# Patient Record
Sex: Male | Born: 1960
Health system: Southern US, Community
[De-identification: ages and names within clinical notes are randomized; demographics above are authoritative.]

## PROBLEM LIST (undated history)

## (undated) DIAGNOSIS — N281 Cyst of kidney, acquired: Secondary | ICD-10-CM

## (undated) DIAGNOSIS — N189 Chronic kidney disease, unspecified: Secondary | ICD-10-CM

## (undated) DIAGNOSIS — N183 Chronic kidney disease, stage 3 unspecified: Secondary | ICD-10-CM

## (undated) DIAGNOSIS — N201 Calculus of ureter: Secondary | ICD-10-CM

## (undated) DIAGNOSIS — G43009 Migraine without aura, not intractable, without status migrainosus: Secondary | ICD-10-CM

## (undated) DIAGNOSIS — I10 Essential (primary) hypertension: Secondary | ICD-10-CM

## (undated) DIAGNOSIS — F909 Attention-deficit hyperactivity disorder, unspecified type: Secondary | ICD-10-CM

## (undated) DIAGNOSIS — Z87442 Personal history of urinary calculi: Secondary | ICD-10-CM

## (undated) DIAGNOSIS — E039 Hypothyroidism, unspecified: Secondary | ICD-10-CM

## (undated) DIAGNOSIS — Z974 Presence of external hearing-aid: Secondary | ICD-10-CM

## (undated) DIAGNOSIS — Z973 Presence of spectacles and contact lenses: Secondary | ICD-10-CM

## (undated) DIAGNOSIS — M1990 Unspecified osteoarthritis, unspecified site: Secondary | ICD-10-CM

## (undated) DIAGNOSIS — K219 Gastro-esophageal reflux disease without esophagitis: Secondary | ICD-10-CM

## (undated) DIAGNOSIS — R011 Cardiac murmur, unspecified: Secondary | ICD-10-CM

## (undated) DIAGNOSIS — R3915 Urgency of urination: Secondary | ICD-10-CM

## (undated) HISTORY — PX: COLONOSCOPY: SHX174

## (undated) HISTORY — DX: Gastro-esophageal reflux disease without esophagitis: K21.9

## (undated) HISTORY — PX: WISDOM TOOTH EXTRACTION: SHX21

## (undated) HISTORY — PX: INGUINAL HERNIA REPAIR: SUR1180

## (undated) HISTORY — PX: KNEE ARTHROSCOPY: SUR90

## (undated) HISTORY — DX: Migraine without aura, not intractable, without status migrainosus: G43.009

## (undated) HISTORY — DX: Attention-deficit hyperactivity disorder, unspecified type: F90.9

## (undated) HISTORY — DX: Chronic kidney disease, unspecified: N18.9

## (undated) HISTORY — DX: Essential (primary) hypertension: I10

## (undated) HISTORY — PX: TONSILLECTOMY: SUR1361

## (undated) SURGERY — ARTHROSCOPY, SHOULDER
Anesthesia: General | Site: Shoulder | Laterality: Left

---

## 1999-04-24 ENCOUNTER — Ambulatory Visit (HOSPITAL_COMMUNITY): Admission: RE | Admit: 1999-04-24 | Discharge: 1999-04-24 | Payer: Self-pay | Admitting: Orthopedic Surgery

## 1999-04-24 ENCOUNTER — Encounter: Payer: Self-pay | Admitting: Orthopedic Surgery

## 2004-10-01 ENCOUNTER — Ambulatory Visit: Payer: Self-pay | Admitting: Hematology & Oncology

## 2005-04-05 ENCOUNTER — Emergency Department (HOSPITAL_COMMUNITY): Admission: EM | Admit: 2005-04-05 | Discharge: 2005-04-05 | Payer: Self-pay | Admitting: Family Medicine

## 2008-04-06 HISTORY — PX: HERNIA REPAIR: SHX51

## 2008-08-13 ENCOUNTER — Encounter: Admission: RE | Admit: 2008-08-13 | Discharge: 2008-08-13 | Payer: Self-pay | Admitting: Otolaryngology

## 2009-05-13 ENCOUNTER — Emergency Department (HOSPITAL_BASED_OUTPATIENT_CLINIC_OR_DEPARTMENT_OTHER): Admission: EM | Admit: 2009-05-13 | Discharge: 2009-05-13 | Payer: Self-pay | Admitting: Emergency Medicine

## 2009-12-12 ENCOUNTER — Ambulatory Visit: Payer: Self-pay | Admitting: Diagnostic Radiology

## 2009-12-12 ENCOUNTER — Ambulatory Visit (HOSPITAL_BASED_OUTPATIENT_CLINIC_OR_DEPARTMENT_OTHER): Admission: RE | Admit: 2009-12-12 | Discharge: 2009-12-12 | Payer: Self-pay | Admitting: Family Medicine

## 2010-02-04 ENCOUNTER — Ambulatory Visit (HOSPITAL_BASED_OUTPATIENT_CLINIC_OR_DEPARTMENT_OTHER): Admission: RE | Admit: 2010-02-04 | Discharge: 2010-02-04 | Payer: Self-pay | Admitting: Surgery

## 2010-06-17 LAB — POCT HEMOGLOBIN-HEMACUE: Hemoglobin: 14.8 g/dL (ref 13.0–17.0)

## 2015-09-03 ENCOUNTER — Encounter: Payer: Self-pay | Admitting: Family Medicine

## 2015-09-03 ENCOUNTER — Ambulatory Visit (INDEPENDENT_AMBULATORY_CARE_PROVIDER_SITE_OTHER): Payer: BLUE CROSS/BLUE SHIELD | Admitting: Family Medicine

## 2015-09-03 VITALS — BP 124/78 | HR 88 | Resp 12 | Ht 71.0 in | Wt 204.8 lb

## 2015-09-03 DIAGNOSIS — F909 Attention-deficit hyperactivity disorder, unspecified type: Secondary | ICD-10-CM | POA: Diagnosis not present

## 2015-09-03 DIAGNOSIS — I10 Essential (primary) hypertension: Secondary | ICD-10-CM

## 2015-09-03 DIAGNOSIS — G43009 Migraine without aura, not intractable, without status migrainosus: Secondary | ICD-10-CM | POA: Diagnosis not present

## 2015-09-03 DIAGNOSIS — R252 Cramp and spasm: Secondary | ICD-10-CM

## 2015-09-03 DIAGNOSIS — G43909 Migraine, unspecified, not intractable, without status migrainosus: Secondary | ICD-10-CM | POA: Insufficient documentation

## 2015-09-03 LAB — BASIC METABOLIC PANEL
BUN: 18 mg/dL (ref 6–23)
CO2: 28 mEq/L (ref 19–32)
Calcium: 9.4 mg/dL (ref 8.4–10.5)
Chloride: 108 mEq/L (ref 96–112)
Creatinine, Ser: 1.2 mg/dL (ref 0.40–1.50)
GFR: 66.45 mL/min (ref >60.00)
Glucose, Bld: 74 mg/dL (ref 70–99)
Potassium: 4 mEq/L (ref 3.5–5.1)
Sodium: 144 mEq/L (ref 135–145)

## 2015-09-03 MED ORDER — ADDERALL XR 20 MG PO CP24: 20.0000 mg | ORAL_CAPSULE | Freq: Every day | ORAL | Status: DC

## 2015-09-03 MED ORDER — BENICAR 20 MG PO TABS: 20.0000 mg | ORAL_TABLET | Freq: Every day | ORAL | Status: DC

## 2015-09-03 MED ORDER — OLMESARTAN MEDOXOMIL 20 MG PO TABS: 20.0000 mg | ORAL_TABLET | Freq: Every day | ORAL | Status: DC

## 2015-09-03 MED ORDER — ADDERALL XR 20 MG PO CP24
20.0000 mg | ORAL_CAPSULE | Freq: Every day | ORAL | Status: DC
Start: 2015-09-03 — End: 2016-01-03

## 2015-09-03 MED ORDER — AMPHETAMINE-DEXTROAMPHET ER 20 MG PO CP24: 20.0000 mg | ORAL_CAPSULE | Freq: Every day | ORAL | Status: DC

## 2015-09-03 NOTE — Progress Notes (Signed)
Pre visit review using our clinic review tool, if applicable. No additional management support is needed unless otherwise documented below in the visit note. 

## 2015-09-03 NOTE — Progress Notes (Signed)
Subjective:    Patient ID: [name], male    DOB: 03-17-1961, 55 y.o.   MRN: [id number]  HPI   [name] is a 55 y.o.male here today to establish care with [name], he is former pt of [name] and I have been his PCP for about 2 years. I have not received records from [name] at the time of this visit.   He has Hx of HTN.HLD,anxiety,migraine headache, CKD III, and ADHD.  Last routine physical was a year ago.  He tries to follow a healthy diet and does exercises regularly.  Concerns today: HTN f/u.  Hypertension: For many years   Currently on Benicar 20 mg daily, which was started about 2 months ago.  Before Benicar was started he was on HCTZ, Verapamil, and Benazepril.  Because abnormal renal test, CKD III, Benazepril was recommended about 3-4 months ago.   When dose of Verapamil was decreased he developed severe headache, similar to those he recalls before this medications was started; also BP was elevated; so we decided to continue it. Since his last OV he was able to wean off Verapamil, this time he tolerated well.  Benazepril caused chocking sensation, "throat felt like closing up", no facial edema or cough. This resolved after stopping Benazepril. Since he started Benicar his BP has been better controlled and was also able to discontinue HCTZ.  He is taking medications as instructed, no major side effects reported. Having some "bad" occasional cramps at night on left thigh and plantar, not sure if it is related to medications. Relieved by walking, no cold extremity or problems during the day.  He has not noted unusual headache, visual changes, exertional chest pain, dyspnea,  focal weakness, or edema.  BP readings at home:110's/70-80''s  + Hx of CKD III.  He has not noted foam in urine or gross hematuria.   Hx of migraine headaches: He has not had an episode in 3 months. He was also on Verapamil, which he discontinued  recently with no problem.   ADHD: Dx a few years ago. Currently he is on Adderall XR 20 mg, decreased from 30 mg a few months ago because it was causing tics. Tolerating well, still helping with concentration and tasks completion. He takes medication from Monday through Friday mainly. Denies depression or anxiety.    Review of Systems  Constitutional: Negative for fever, activity change, appetite change, fatigue and unexpected weight change.  HENT: Negative for nosebleeds, sore throat and trouble swallowing.   Eyes: Negative for redness and visual disturbance.  Respiratory: Negative for apnea, cough, shortness of breath and wheezing.   Cardiovascular: Negative for chest pain, palpitations and leg swelling.  Gastrointestinal: Negative for nausea, vomiting and abdominal pain.       No changes in bowel habits.  Genitourinary: Negative for dysuria, hematuria and decreased urine volume.  Musculoskeletal: Negative for joint swelling and arthralgias.       LE cramps: left thigh and plantar.  Skin: Negative for color change and rash.  Neurological: Negative for dizziness, tremors, seizures, syncope, weakness, numbness and headaches.  Psychiatric/Behavioral: Negative for hallucinations, behavioral problems and sleep disturbance. The patient is not nervous/anxious.     No current outpatient prescriptions on file prior to visit.   No current facility-administered medications on file prior to visit.     History reviewed. No pertinent past medical history.  Social History   Social History  . Marital Status: Married    Spouse Name: N/A  .  Number of Children: N/A  . Years of Education: N/A   Social History Main Topics  . Smoking status: Never Smoker   . Smokeless tobacco: Never Used  . Alcohol Use: None  . Drug Use: None  . Sexual Activity: Not Asked   Other Topics Concern  . None   Social History Narrative  . None    Filed Vitals:   09/03/15 0826  BP: 124/78  Pulse: 88    Resp: 12   Body mass index is 28.58 kg/(m^2).  SpO2 Readings from Last 3 Encounters:  09/03/15 96%       Objective:   Physical Exam  Constitutional: He is oriented to person, place, and time. He appears well-developed and well-nourished. No distress.  HENT:  Head: Atraumatic.  Mouth/Throat: Uvula is midline, oropharynx is clear and moist and mucous membranes are normal.  Eyes: Conjunctivae and EOM are normal. Pupils are equal, round, and reactive to light.  Neck: No thyroid mass and no thyromegaly present.  Cardiovascular: Normal rate and regular rhythm.   No murmur heard. Pulmonary/Chest: Effort normal. He has no wheezes. He has no rales.  Abdominal: Soft. He exhibits no mass. There is no hepatomegaly. There is no tenderness.  Musculoskeletal: He exhibits no edema.       Right lower leg: He exhibits no tenderness.       Left lower leg: He exhibits no tenderness.  Lymphadenopathy:    He has no cervical adenopathy.  Neurological: He is alert and oriented to person, place, and time. He has normal strength. Coordination and gait normal.  Skin: Skin is warm.  Psychiatric: He has a normal mood and affect. His speech is normal.  Well groomed, good eye contact.        Assessment & Plan:    [name] was seen today for new patient (initial visit).  Diagnoses and all orders for this visit:   Attention deficit hyperactivity disorder (ADHD), unspecified ADHD type  Well controlled. Some side effects of medication discussed. No changes in current management, post dated Rx's given x 3. Medication contract signed. F/U in 3-4 months.  -     ADDERALL XR 20 MG 24 hr capsule; Take 1 capsule (20 mg total) by mouth daily.   Essential hypertension  Adequately controlled. No changes in current management for now but may consider decreasing dose if SBP's still in low 100"s. Low salt diet. Eye exam recommended annually. F/U in 4 months, before if needed.  -     Basic metabolic  panel -     BENICAR 20 MG tablet; Take 1 tablet (20 mg total) by mouth daily.   Migraine without aura and without status migrainosus, not intractable  Stable overall. If he starts having frequent episodes again Verapamil may need to be resumed. F/U as needed.   Cramp of both lower extremities  OTC Mg Oxide 250 mg may help. Stretching exercises may also help.  BMP today, further recommendations will be given accordingly.   -Patient advised to return sooner if  new concerns arise.     Theador Jezewski G. [place], MD  [facility]. [place] office.

## 2015-09-03 NOTE — Patient Instructions (Signed)
A few things to remember from today's visit:   1. Essential hypertension  - Basic metabolic panel - BENICAR 20 MG tablet; Take 1 tablet (20 mg total) by mouth daily.  Dispense: 90 tablet; Refill: 1  2. Attention deficit hyperactivity disorder (ADHD), unspecified ADHD type  - ADDERALL XR 20 MG 24 hr capsule; Take 1 capsule (20 mg total) by mouth daily.  Dispense: 30 capsule; Refill: 0  3. Migraine without aura and without status migrainosus, not intractable  Goal < 140/90.  Chronic problems are otherwise stable. No changes in current management today.  Remember to arrange your follow up appt before leaving today.       If you sign-up for My chart, you can communicate easier with us in case you have any question or concern.

## 2015-09-04 ENCOUNTER — Telehealth: Payer: Self-pay

## 2015-09-04 NOTE — Telephone Encounter (Signed)
-----   Message from [name] G [place], MD sent at 09/04/2015 12:32 PM EDT ----- Normal renal function. Thanks!

## 2015-09-04 NOTE — Telephone Encounter (Signed)
Called patient. Gave lab results. Patient verbalized understanding.  

## 2015-09-23 ENCOUNTER — Telehealth: Payer: Self-pay | Admitting: Family Medicine

## 2015-09-23 DIAGNOSIS — I10 Essential (primary) hypertension: Secondary | ICD-10-CM

## 2015-09-23 MED ORDER — BENICAR 20 MG PO TABS: 20.0000 mg | ORAL_TABLET | Freq: Every day | ORAL | Status: DC

## 2015-09-23 MED ORDER — BENICAR HCT 20-12.5 MG PO TABS: 1.0000 | ORAL_TABLET | Freq: Every day | ORAL | Status: DC

## 2015-09-23 NOTE — Telephone Encounter (Signed)
Pt need to have brand name Rx Benicar 20 mg HCT 12.5mg        Pharm:  [facility]      Pt would like to have a call when this has been done.

## 2015-09-23 NOTE — Telephone Encounter (Signed)
Rx has been sent to pharmacy & patient has been notified.

## 2015-12-06 ENCOUNTER — Other Ambulatory Visit: Payer: Self-pay | Admitting: Family Medicine

## 2015-12-06 DIAGNOSIS — R0602 Shortness of breath: Secondary | ICD-10-CM

## 2015-12-13 ENCOUNTER — Other Ambulatory Visit (HOSPITAL_COMMUNITY): Payer: BLUE CROSS/BLUE SHIELD

## 2015-12-24 ENCOUNTER — Ambulatory Visit (HOSPITAL_COMMUNITY): Payer: BLUE CROSS/BLUE SHIELD | Attending: Cardiology

## 2015-12-24 ENCOUNTER — Other Ambulatory Visit: Payer: Self-pay

## 2015-12-24 DIAGNOSIS — R0602 Shortness of breath: Secondary | ICD-10-CM

## 2015-12-24 DIAGNOSIS — I253 Aneurysm of heart: Secondary | ICD-10-CM | POA: Diagnosis not present

## 2015-12-24 DIAGNOSIS — I351 Nonrheumatic aortic (valve) insufficiency: Secondary | ICD-10-CM | POA: Diagnosis not present

## 2015-12-24 DIAGNOSIS — I10 Essential (primary) hypertension: Secondary | ICD-10-CM | POA: Insufficient documentation

## 2016-01-03 ENCOUNTER — Ambulatory Visit (INDEPENDENT_AMBULATORY_CARE_PROVIDER_SITE_OTHER): Payer: BLUE CROSS/BLUE SHIELD | Admitting: Family Medicine

## 2016-01-03 ENCOUNTER — Encounter: Payer: Self-pay | Admitting: Family Medicine

## 2016-01-03 VITALS — BP 130/80 | HR 61 | Resp 12 | Ht 71.0 in | Wt 209.1 lb

## 2016-01-03 DIAGNOSIS — N183 Chronic kidney disease, stage 3 unspecified: Secondary | ICD-10-CM

## 2016-01-03 DIAGNOSIS — G43009 Migraine without aura, not intractable, without status migrainosus: Secondary | ICD-10-CM

## 2016-01-03 DIAGNOSIS — F909 Attention-deficit hyperactivity disorder, unspecified type: Secondary | ICD-10-CM | POA: Diagnosis not present

## 2016-01-03 DIAGNOSIS — E785 Hyperlipidemia, unspecified: Secondary | ICD-10-CM

## 2016-01-03 DIAGNOSIS — I10 Essential (primary) hypertension: Secondary | ICD-10-CM

## 2016-01-03 DIAGNOSIS — E559 Vitamin D deficiency, unspecified: Secondary | ICD-10-CM | POA: Diagnosis not present

## 2016-01-03 LAB — COMPREHENSIVE METABOLIC PANEL
ALK PHOS: 69 U/L (ref 39–117)
ALT: 16 U/L (ref 0–53)
AST: 19 U/L (ref 0–37)
Albumin: 3.7 g/dL (ref 3.5–5.2)
BILIRUBIN TOTAL: 0.5 mg/dL (ref 0.2–1.2)
BUN: 22 mg/dL (ref 6–23)
CALCIUM: 8.4 mg/dL (ref 8.4–10.5)
CO2: 28 meq/L (ref 19–32)
CREATININE: 1.13 mg/dL (ref 0.40–1.50)
Chloride: 106 mEq/L (ref 96–112)
GFR: 71.13 mL/min (ref >60.00)
GLUCOSE: 87 mg/dL (ref 70–99)
Potassium: 4.1 mEq/L (ref 3.5–5.1)
Sodium: 139 mEq/L (ref 135–145)
TOTAL PROTEIN: 6.2 g/dL (ref 6.0–8.3)

## 2016-01-03 LAB — CBC
HEMATOCRIT: 42.1 % (ref 39.0–52.0)
Hemoglobin: 14.4 g/dL (ref 13.0–17.0)
MCHC: 34.3 g/dL (ref 30.0–36.0)
MCV: 87.8 fl (ref 78.0–100.0)
PLATELETS: 232 10*3/uL (ref 150.0–400.0)
RBC: 4.79 Mil/uL (ref 4.22–5.81)
RDW: 13.5 % (ref 11.5–15.5)
WBC: 5.2 10*3/uL (ref 4.0–10.5)

## 2016-01-03 LAB — LIPID PANEL
CHOLESTEROL: 184 mg/dL (ref 0–200)
HDL: 38.9 mg/dL — AB (ref >39.00)
LDL Cholesterol: 130 mg/dL — ABNORMAL HIGH (ref 0–99)
NonHDL: 144.73
TRIGLYCERIDES: 75 mg/dL (ref 0.0–149.0)
Total CHOL/HDL Ratio: 5
VLDL: 15 mg/dL (ref 0.0–40.0)

## 2016-01-03 LAB — VITAMIN D 25 HYDROXY (VIT D DEFICIENCY, FRACTURES): VITD: 58.83 ng/mL (ref 30.00–100.00)

## 2016-01-03 MED ORDER — BENICAR 20 MG PO TABS: 20.0000 mg | ORAL_TABLET | Freq: Every day | ORAL | 2 refills | Status: DC

## 2016-01-03 MED ORDER — ADDERALL XR 20 MG PO CP24: 20.0000 mg | ORAL_CAPSULE | Freq: Every day | ORAL | 0 refills | Status: DC

## 2016-01-03 MED ORDER — ADDERALL XR 20 MG PO CP24
20.0000 mg | ORAL_CAPSULE | Freq: Every day | ORAL | 0 refills | Status: DC
Start: 2016-01-03 — End: 2016-01-03

## 2016-01-03 NOTE — Progress Notes (Signed)
HPI:   [name] is a 55 y.o. male, who is here today to follow on some of his chronic medical problems.  History of migraine headaches, he had an episode about 1-2 weeks ago, which he attributes to forgetting his Benicar. In the past he was taking Verapamil,headaches improved otvertime. Headache was frontal parietal, moderate,no associated nausea or vomiting, mild epiphora and photophobia, it lasted less than 24 hours. He denies any associated numbness, tingling, or focal deficit.  Hypertension: Currently he is on Benicar 20 mg. Reporting no side effects from medication. He denies any frequent/severe headache, visual changes, chest pain, dyspnea, palpitation, abdominal pain, nausea, vomiting, or edema.  Lab Results  Component Value Date   CREATININE 1.20 09/03/2015   BUN 18 09/03/2015   NA 144 09/03/2015   K 4.0 09/03/2015   CL 108 09/03/2015   CO2 28 09/03/2015   Home BP readings he has some DBP's in the low 90's, SBP in the mid 130's.  Hx of CKD III, He has not noted any gross hematuria or foam in the urine. Vitamin D deficiency, currently he is on OTC vitamin D 2000 units daily.   Hyperlipidemia: He is currently on nonpharmacologic treatment. He exercises regularly and tries to follow a healthy diet.  ADHD: He is taking Adderall 20 Mg daily, sometimes he skips medication during weekends or vacation. He denies any insomnia, anxiety, or tics. Medications the helped with concentration and so far this dose has been well tolerated.   Concerns today: none.     Review of Systems  Constitutional: Negative for appetite change, fatigue, fever and unexpected weight change.  HENT: Negative for facial swelling, nosebleeds, sore throat and trouble swallowing.   Eyes: Negative for pain, redness and visual disturbance.  Respiratory: Negative for apnea, cough, shortness of breath and wheezing.   Cardiovascular: Negative for chest pain, palpitations and leg  swelling.  Gastrointestinal: Negative for abdominal pain, nausea and vomiting.       No changes in bowel habits.  Genitourinary: Negative for decreased urine volume, difficulty urinating, dysuria and hematuria.  Musculoskeletal: Negative for gait problem and myalgias.  Neurological: Negative for dizziness, tremors, syncope, facial asymmetry, weakness, numbness and headaches.  Psychiatric/Behavioral: Negative for hallucinations and sleep disturbance. The patient is not nervous/anxious.       No current outpatient prescriptions on file prior to visit.   No current facility-administered medications on file prior to visit.      No past medical history on file. Allergies  Allergen Reactions  . Sulfa Antibiotics Other (See Comments)    diarrhea  . Benazepril Hcl Swelling    Throat "closing up"  . Penicillins Other (See Comments)    Unknown    Social History   Social History  . Marital status: Married    Spouse name: N/A  . Number of children: N/A  . Years of education: N/A   Social History Main Topics  . Smoking status: Never Smoker  . Smokeless tobacco: Never Used  . Alcohol use None  . Drug use: Unknown  . Sexual activity: Not Asked   Other Topics Concern  . None   Social History Narrative  . None    Vitals:   01/03/16 0747  BP: 130/80  Pulse: 61  Resp: 12   Body mass index is 29.17 kg/m.      Physical Exam  Nursing note and vitals reviewed. Constitutional: He is oriented to person, place, and time. He appears well-developed. No  distress.  HENT:  Head: Atraumatic.  Mouth/Throat: Oropharynx is clear and moist and mucous membranes are normal.  Eyes: Conjunctivae and EOM are normal. Pupils are equal, round, and reactive to light.  Neck: No thyroid mass and no thyromegaly present.  Cardiovascular: Normal rate and regular rhythm.   No murmur heard. Pulses:      Dorsalis pedis pulses are 2+ on the right side, and 2+ on the left side.  Respiratory:  Effort normal and breath sounds normal. No respiratory distress.  GI: Soft. He exhibits no mass. There is no hepatomegaly. There is no tenderness.  Musculoskeletal: He exhibits no edema or tenderness.  Neurological: He is alert and oriented to person, place, and time. He has normal strength. No cranial nerve deficit. Coordination and gait normal.  Skin: Skin is warm. No erythema.  Psychiatric: He has a normal mood and affect.  Well groomed, good eye contact.      ASSESSMENT AND PLAN:     [name] was seen today for follow-up.  Diagnoses and all orders for this visit:  Attention deficit hyperactivity disorder (ADHD), unspecified ADHD type  Stable. No changes in current management. Some side effects of medication were discussed. Rx x 3 given today. F/U in 6 months.   -     Discontinue: ADDERALL XR 20 MG 24 hr capsule; Take 1 capsule (20 mg total) by mouth daily. -     Discontinue: ADDERALL XR 20 MG 24 hr capsule; Take 1 capsule (20 mg total) by mouth daily. -     ADDERALL XR 20 MG 24 hr capsule; Take 1 capsule (20 mg total) by mouth daily.  Essential hypertension  Otherwise adequately controlled but at home some DBP's in low 90's. He was instructed to monitor BP at home for the next 2-3 weeks, daily, he will send BP readings through my chart. No changes in current management for now but on my increase Benicar dose from 20-30 mg daily depending on BP readings in the next few weeks. DASH-low fat diet recommended. Eye exam recommended annually. F/U in 6 months, before if needed.  -     CMP -     BENICAR 20 MG tablet; Take 1 tablet (20 mg total) by mouth daily.  CKD (chronic kidney disease), stage III  Stable. Continue Benicar. Low salt diet and avoidance of NSAIDs. BP goal < 140/90.  -     CMP -     CBC -     Microalbumin/Creatinine Ratio, Urine -     BENICAR 20 MG tablet; Take 1 tablet (20 mg total) by mouth daily.  Hyperlipidemia  Continue nonpharmacologic  treatment. Further recommendations would be given according to lab results. F/U in 6-12 months.  -     CMP -     Lipid panel  Migraine without aura and without status migrainosus, not intractable  Stable overall. He has had just 1 episode of migraines since his last OV in May 2017. Instructed about warning signs.  Vitamin D deficiency  No changes in current management, will follow labs done today and will give further recommendations accordingly.  -     VITAMIN D 25 Hydroxy (Vit-D Deficiency, Fractures)    -He has refused influenza vaccine.    -Mr. Kathreen CornfieldWalter Bernard Eno was advised to return sooner than planned today if new concerns arise.       Prestin Munch G. [place], MD  [facility]. [place] office.

## 2016-01-03 NOTE — Progress Notes (Signed)
Pre visit review using our clinic review tool, if applicable. No additional management support is needed unless otherwise documented below in the visit note. 

## 2016-01-03 NOTE — Patient Instructions (Addendum)
A few things to remember from today's visit:   Attention deficit hyperactivity disorder (ADHD), unspecified ADHD type - Plan: ADDERALL XR 20 MG 24 hr capsule, DISCONTINUED: ADDERALL XR 20 MG 24 hr capsule, DISCONTINUED: ADDERALL XR 20 MG 24 hr capsule  Essential hypertension - Plan: CMP, BENICAR 20 MG tablet  CKD (chronic kidney disease), stage III - Plan: CMP, CBC, Microalbumin/Creatinine Ratio, Urine, BENICAR 20 MG tablet  Hyperlipidemia - Plan: CMP, Lipid panel  Migraine without aura and without status migrainosus, not intractable  Vitamin D deficiency - Plan: VITAMIN D 25 Hydroxy (Vit-D Deficiency, Fractures)   [name], today we have followed on some of your chronic medical problems and they seem to be stable, so no changes in current management today.  Review medication list and be sure if is accurate.  -Remember a healthy diet and regular physical activity are very important for prevention as well as for well being; they also help with many chronic problems, decreasing the need of adding new medications and delaying or preventing possible complications.  Remember to arrange your follow up appt before leaving today. Please follow sooner than planned if a new concern arises.  135/88 today.  Monitor blood pressure at home and send me a log of reading, I may increase Benicar dose from 1 tab to 1.5 tab.    Please be sure medication list is accurate. If a new problem present, please set up appointment sooner than planned today.

## 2016-07-03 ENCOUNTER — Encounter: Payer: BLUE CROSS/BLUE SHIELD | Admitting: Family Medicine

## 2016-07-26 NOTE — Progress Notes (Signed)
HPI:  [name] is a 56 y.o.male here today for his routine physical examination.  He lives with his wife.  Regular exercise 3 or more times per week: Yes, he is going to the gym and running. Following a healthy diet: Yes.   Chronic medical problems: HTN,CKD III,ADHD,vit D deficiency,HLD,and migraine headache, hearing loss bilateral with hearing aids among some.  Hx of STD's: Denies.   -Hep C screening: Has not been done, denies risk factors. Tdap: about 6 years ago, 2012 (per pt report).  Last colon cancer screening: It is due, he has appt already scheduled (2 weeks), consultation 06/10/16. Last prostate ca screening: 2016-2017, no urinary symptoms.  -Denies high alcohol intake, tobacco use, or Hx of illicit drug use.  -Concerns and/or follow up today: Elevated BP  HTN: Currently he is on Benicar 20 mg daily BP readings: 140's-150's/90-100's. Denies severe/frequent headache, visual changes, chest pain, dyspnea, palpitation, claudication, focal weakness, or edema.  HLD: He is on non pharmacologic treatment.  ADHD: He is on Adderall XR 20 mg daily, tolerating well.   Review of Systems  Constitutional: Negative for activity change, appetite change, fatigue, fever and unexpected weight change.  HENT: Negative for dental problem, nosebleeds, sore throat, trouble swallowing and voice change.   Eyes: Negative for redness and visual disturbance.  Respiratory: Negative for cough, shortness of breath and wheezing.   Cardiovascular: Negative for chest pain, palpitations and leg swelling.  Gastrointestinal: Negative for abdominal pain, blood in stool, nausea and vomiting.  Endocrine: Negative for cold intolerance, heat intolerance, polydipsia, polyphagia and polyuria.  Genitourinary: Negative for decreased urine volume, frequency, genital sores, hematuria and testicular pain.  Musculoskeletal: Negative for arthralgias, back pain, joint swelling and myalgias.    Skin: Negative for color change and rash.  Neurological: Negative for syncope, weakness and headaches.  Hematological: Negative for adenopathy. Does not bruise/bleed easily.  Psychiatric/Behavioral: Negative for confusion and sleep disturbance. The patient is not nervous/anxious.   All other systems reviewed and are negative.    No current outpatient prescriptions on file prior to visit.   No current facility-administered medications on file prior to visit.      Past Medical History:  Diagnosis Date  . ADHD   . Chronic kidney disease   . GERD (gastroesophageal reflux disease)   . Hypertension   . Migraine headache without aura     Allergies  Allergen Reactions  . Sulfa Antibiotics Other (See Comments)    diarrhea  . Benazepril Hcl Swelling    Throat "closing up"  . Penicillins Other (See Comments)    Unknown    Family History  Problem Relation Age of Onset  . Mental illness Mother   . Cancer Paternal Aunt     lung  . Cancer Paternal Grandmother     lung  . Diabetes Neg Hx     Social History   Social History  . Marital status: Married    Spouse name: N/A  . Number of children: N/A  . Years of education: N/A   Social History Main Topics  . Smoking status: Never Smoker  . Smokeless tobacco: Never Used  . Alcohol use 0.0 oz/week     Comment: occasionally  . Drug use: No  . Sexual activity: Yes   Other Topics Concern  . None   Social History Narrative  . None     Vitals:   07/27/16 0804 07/27/16 0853  BP: 138/90 140/90  Pulse: 68  Resp: 12    Body mass index is 28.75 kg/m.  O2 sat at RA 96%  Wt Readings from Last 3 Encounters:  07/27/16 206 lb 2 oz (93.5 kg)  01/03/16 209 lb 2 oz (94.9 kg)  09/03/15 204 lb 12.8 oz (92.9 kg)    Physical Exam  Nursing note and vitals reviewed. Constitutional: He is oriented to person, place, and time. He appears well-developed. No distress.  HENT:  Head: Atraumatic.  Right Ear: Tympanic membrane,  external ear and ear canal normal. Decreased hearing is noted.  Left Ear: Tympanic membrane, external ear and ear canal normal. Decreased hearing is noted.  Mouth/Throat: Oropharynx is clear and moist and mucous membranes are normal.  Eyes: Conjunctivae and EOM are normal. Pupils are equal, round, and reactive to light.  Neck: Normal range of motion. No tracheal deviation present. No thyromegaly (palpable) present.  Cardiovascular: Normal rate and regular rhythm.   Murmur (soft  RUSB) heard. Pulses:      Dorsalis pedis pulses are 2+ on the right side, and 2+ on the left side.  Respiratory: Effort normal and breath sounds normal. No respiratory distress.  GI: Soft. He exhibits no mass. There is no hepatomegaly. There is no tenderness. A hernia (small umbilical hernia,redicible) is present.  Genitourinary:  Genitourinary Comments: Refused,no concerns.  Musculoskeletal: He exhibits no edema or tenderness.  No major deformities appreciated and no signs of synovitis.  Lymphadenopathy:    He has no cervical adenopathy.       Right: No supraclavicular adenopathy present.       Left: No supraclavicular adenopathy present.  Neurological: He is alert and oriented to person, place, and time. He has normal strength. No cranial nerve deficit or sensory deficit. Coordination and gait normal.  Reflex Scores:      Bicep reflexes are 2+ on the right side and 2+ on the left side.      Patellar reflexes are 2+ on the right side and 2+ on the left side. Skin: Skin is warm. No rash noted. No erythema.  Psychiatric: His mood appears anxious. Cognition and memory are normal.  Well groomed,good eye contact.    ASSESSMENT AND PLAN:   Discussed the following assessment and plan:   Nat was seen today for annual exam.  Diagnoses and all orders for this visit:  Routine general medical examination at a health care facility   We discussed the importance of regular physical activity and healthy diet for  prevention of chronic illness and/or complications. Preventive guidelines reviewed. Vaccination up to date.  Next CPE in 1 year.   -     Comprehensive metabolic panel -     HIV antibody  Diabetes mellitus screening -     Hemoglobin A1c -     Comprehensive metabolic panel  Prostate cancer screening -     PSA(Must document that pt has been informed of limitations of PSA testing.)  Encounter for hepatitis C screening test for low risk patient -     Hepatitis C antibody screen  Hyperlipidemia, unspecified hyperlipidemia type  Continue non pharmacologic management, will follow labs done today and will give further recommendations accordingly.  -     Lipid panel  CKD (chronic kidney disease), stage III  His e GFR has improved. No changes in Benicar. He could not collect urine today.  -     BENICAR 20 MG tablet; Take 1.5 tablets (30 mg total) by mouth daily.  Attention deficit hyperactivity disorder (ADHD), unspecified ADHD type  Well controlled. No changes in current management. F/U in 4 months.  -     Discontinue: ADDERALL XR 20 MG 24 hr capsule; Take 1 capsule (20 mg total) by mouth daily. -     Discontinue: ADDERALL XR 20 MG 24 hr capsule; Take 1 capsule (20 mg total) by mouth daily. -     ADDERALL XR 20 MG 24 hr capsule; Take 1 capsule (20 mg total) by mouth daily.  Essential hypertension  BP elevated. Possible complications of elevated BP discussed. Benicar increased from 20 mg to 30 mg. Annual eye examination. F/U in 3-4 months.  -     BENICAR 20 MG tablet; Take 1.5 tablets (30 mg total) by mouth daily. -     TSH     Return in 4 months (on 11/26/2016) for HTN-.    Alcides Nutting G. [place], MD  [facility] [place] office.

## 2016-07-27 ENCOUNTER — Encounter: Payer: Self-pay | Admitting: Family Medicine

## 2016-07-27 ENCOUNTER — Ambulatory Visit (INDEPENDENT_AMBULATORY_CARE_PROVIDER_SITE_OTHER): Payer: BLUE CROSS/BLUE SHIELD | Admitting: Family Medicine

## 2016-07-27 VITALS — BP 140/90 | HR 68 | Resp 12 | Ht 71.0 in | Wt 206.1 lb

## 2016-07-27 DIAGNOSIS — Z125 Encounter for screening for malignant neoplasm of prostate: Secondary | ICD-10-CM

## 2016-07-27 DIAGNOSIS — E785 Hyperlipidemia, unspecified: Secondary | ICD-10-CM

## 2016-07-27 DIAGNOSIS — N183 Chronic kidney disease, stage 3 unspecified: Secondary | ICD-10-CM

## 2016-07-27 DIAGNOSIS — Z0000 Encounter for general adult medical examination without abnormal findings: Secondary | ICD-10-CM

## 2016-07-27 DIAGNOSIS — Z131 Encounter for screening for diabetes mellitus: Secondary | ICD-10-CM

## 2016-07-27 DIAGNOSIS — F909 Attention-deficit hyperactivity disorder, unspecified type: Secondary | ICD-10-CM | POA: Diagnosis not present

## 2016-07-27 DIAGNOSIS — Z1159 Encounter for screening for other viral diseases: Secondary | ICD-10-CM | POA: Diagnosis not present

## 2016-07-27 DIAGNOSIS — I10 Essential (primary) hypertension: Secondary | ICD-10-CM | POA: Diagnosis not present

## 2016-07-27 LAB — LIPID PANEL
CHOL/HDL RATIO: 5
Cholesterol: 213 mg/dL — ABNORMAL HIGH (ref 0–200)
HDL: 43.1 mg/dL (ref >39.00)
LDL CALC: 151 mg/dL — AB (ref 0–99)
NONHDL: 170.39
TRIGLYCERIDES: 96 mg/dL (ref 0.0–149.0)
VLDL: 19.2 mg/dL (ref 0.0–40.0)

## 2016-07-27 LAB — COMPREHENSIVE METABOLIC PANEL
ALBUMIN: 4.2 g/dL (ref 3.5–5.2)
ALT: 16 U/L (ref 0–53)
AST: 21 U/L (ref 0–37)
Alkaline Phosphatase: 65 U/L (ref 39–117)
BILIRUBIN TOTAL: 0.7 mg/dL (ref 0.2–1.2)
BUN: 22 mg/dL (ref 6–23)
CALCIUM: 9.2 mg/dL (ref 8.4–10.5)
CO2: 28 mEq/L (ref 19–32)
CREATININE: 1.23 mg/dL (ref 0.40–1.50)
Chloride: 108 mEq/L (ref 96–112)
GFR: 64.37 mL/min (ref >60.00)
Glucose, Bld: 88 mg/dL (ref 70–99)
Potassium: 4.4 mEq/L (ref 3.5–5.1)
Sodium: 143 mEq/L (ref 135–145)
Total Protein: 6.2 g/dL (ref 6.0–8.3)

## 2016-07-27 LAB — TSH: TSH: 7.53 u[IU]/mL — ABNORMAL HIGH (ref 0.35–4.50)

## 2016-07-27 LAB — PSA: PSA: 0.13 ng/mL (ref 0.10–4.00)

## 2016-07-27 LAB — HEMOGLOBIN A1C: Hgb A1c MFr Bld: 5.6 % (ref 4.6–6.5)

## 2016-07-27 MED ORDER — ADDERALL XR 20 MG PO CP24: 20.0000 mg | ORAL_CAPSULE | Freq: Every day | ORAL | 0 refills | Status: DC

## 2016-07-27 MED ORDER — BENICAR 20 MG PO TABS: 30.0000 mg | ORAL_TABLET | Freq: Every day | ORAL | 2 refills | Status: DC

## 2016-07-27 NOTE — Progress Notes (Signed)
Pre visit review using our clinic review tool, if applicable. No additional management support is needed unless otherwise documented below in the visit note. 

## 2016-07-27 NOTE — Patient Instructions (Addendum)
A few things to remember from today's visit:   Diabetes mellitus screening - Plan: Hemoglobin A1c, Comprehensive metabolic panel  Prostate cancer screening - Plan: PSA(Must document that pt has been informed of limitations of PSA testing.)  Encounter for hepatitis C screening test for low risk patient - Plan: Hepatitis C antibody screen  Hyperlipidemia, unspecified hyperlipidemia type - Plan: Lipid panel  CKD (chronic kidney disease), stage III - Plan: BENICAR 20 MG tablet  Routine general medical examination at a health care facility - Plan: Comprehensive metabolic panel, HIV antibody  Attention deficit hyperactivity disorder (ADHD), unspecified ADHD type - Plan: ADDERALL XR 20 MG 24 hr capsule, DISCONTINUED: ADDERALL XR 20 MG 24 hr capsule, DISCONTINUED: ADDERALL XR 20 MG 24 hr capsule  Essential hypertension - Plan: BENICAR 20 MG tablet   Please be sure medication list is accurate. If a new problem present, please set up appointment sooner than planned today.

## 2016-07-28 LAB — HIV ANTIBODY (ROUTINE TESTING W REFLEX): HIV 1&2 Ab, 4th Generation: NONREACTIVE

## 2016-07-28 LAB — HEPATITIS C ANTIBODY: HCV Ab: NEGATIVE

## 2016-07-29 ENCOUNTER — Encounter: Payer: Self-pay | Admitting: Family Medicine

## 2016-07-29 ENCOUNTER — Other Ambulatory Visit: Payer: Self-pay

## 2016-07-29 MED ORDER — SIMVASTATIN 20 MG PO TABS: 20.0000 mg | ORAL_TABLET | Freq: Every day | ORAL | 1 refills | Status: DC

## 2016-12-08 ENCOUNTER — Telehealth: Payer: Self-pay | Admitting: Family Medicine

## 2016-12-08 DIAGNOSIS — F909 Attention-deficit hyperactivity disorder, unspecified type: Secondary | ICD-10-CM

## 2016-12-08 NOTE — Telephone Encounter (Signed)
Rx last filled 11/09/16. Patient has follow up appt on 9/10.  Okay to refill?

## 2016-12-08 NOTE — Telephone Encounter (Signed)
Pt need new Rx for Adderall  Pt is aware of 3 business days for refills and someone will call when ready for pick up.   Pt state that he only has 4 on hand and he does have an appointment on  Monday 12/14/16.

## 2016-12-09 NOTE — Telephone Encounter (Signed)
Adderall XR 20 mg can be printed , #30/0  Thanks, BJ

## 2016-12-11 MED ORDER — ADDERALL XR 20 MG PO CP24: 20.0000 mg | ORAL_CAPSULE | Freq: Every day | ORAL | 0 refills | Status: DC

## 2016-12-11 NOTE — Telephone Encounter (Signed)
Left voicemail for patient letting him know that Rx is up front and ready for pick up.

## 2016-12-11 NOTE — Telephone Encounter (Signed)
Rx printed for signature. 

## 2016-12-13 NOTE — Progress Notes (Signed)
HPI:   [name] is a 56 y.o. male, who is here today to follow on some chronic medical problems.  Last seen on 07/27/16 for his CPE.  ADHD: On Adderall XR 20 mg daily.  He takes medication from Monday through Friday and as needed weekends. Medication is still helping with concentration, task completion in a timing matter. He denies side effects. He denies depressed mood. He has some underlying anxiety but denies worsening symptoms with Adderall.   Hypertension:   Currently on Benicar 30 mg daily, increased from 20 mg last OV. Home BP readings: "Good." + Diastolic dysfunction grade I. He denies orthopnea or PND. Echo in 12/2015: LVEF 50-55%.  Last eye exam: a year ago, has an appt coming. He is taking medications as instructed, no side effects reported.  He denies headache, visual changes, exertional chest pain, dyspnea,  focal weakness, or edema.   Lab Results  Component Value Date   CREATININE 1.23 07/27/2016   BUN 22 07/27/2016   NA 143 07/27/2016   K 4.4 07/27/2016   CL 108 07/27/2016   CO2 28 07/27/2016   CKD III, his renal function has been > 60 for the past few months, basically since he started Benicar and his HTN has been better controlled. He denies gross hematuria or foam in urine.  Hyperlipidemia:  Currently on Simvastatin 20 mg daily, started in 07/2016. Following a low fat diet: Yes.  He has not noted side effects with medication.  Lab Results  Component Value Date   CHOL 213 (H) 07/27/2016   HDL 43.10 07/27/2016   LDLCALC 151 (H) 07/27/2016   TRIG 96.0 07/27/2016   CHOLHDL 5 07/27/2016    Abnormal TSH on 07/27/16.  Lab Results  Component Value Date   TSH 7.53 (H) 07/27/2016   Denies abnormal wt changes,constipation, tremor,fatigue,cold/heat intolerance.    Review of Systems  Constitutional: Negative for activity change, appetite change, fatigue, fever and unexpected weight change.  HENT: Negative for mouth  sores, nosebleeds, sore throat, trouble swallowing and voice change.   Eyes: Negative for redness and visual disturbance.  Respiratory: Negative for cough, shortness of breath and wheezing.   Cardiovascular: Negative for chest pain, palpitations and leg swelling.  Gastrointestinal: Negative for abdominal pain, nausea and vomiting.  Endocrine: Negative for cold intolerance and heat intolerance.  Genitourinary: Negative for decreased urine volume, dysuria and hematuria.  Musculoskeletal: Negative for gait problem and myalgias.  Skin: Negative for pallor and rash.  Neurological: Negative for dizziness, syncope, weakness and headaches.  Psychiatric/Behavioral: Negative for confusion and sleep disturbance. The patient is nervous/anxious.      Current Outpatient Prescriptions on File Prior to Visit  Medication Sig Dispense Refill  . simvastatin (ZOCOR) 20 MG tablet Take 1 tablet (20 mg total) by mouth daily. 90 tablet 1   No current facility-administered medications on file prior to visit.      Past Medical History:  Diagnosis Date  . ADHD   . Chronic kidney disease   . GERD (gastroesophageal reflux disease)   . Hypertension   . Migraine headache without aura    Allergies  Allergen Reactions  . Sulfa Antibiotics Other (See Comments)    diarrhea  . Benazepril Hcl Swelling    Throat "closing up"  . Penicillins Other (See Comments)    Unknown    Social History   Social History  . Marital status: Married    Spouse name: N/A  . Number of children: N/A  .  Years of education: N/A   Social History Main Topics  . Smoking status: Never Smoker  . Smokeless tobacco: Never Used  . Alcohol use 0.0 oz/week     Comment: occasionally  . Drug use: No  . Sexual activity: Yes   Other Topics Concern  . None   Social History Narrative  . None    Vitals:   12/14/16 1415  BP: 132/80  Pulse: 83  Resp: 12  SpO2: 96%   Body mass index is 28.75 kg/m.   Physical Exam  Nursing  note and vitals reviewed. Constitutional: He is oriented to person, place, and time. He appears well-developed. No distress.  HENT:  Head: Normocephalic and atraumatic.  Mouth/Throat: Oropharynx is clear and moist and mucous membranes are normal.  Eyes: Pupils are equal, round, and reactive to light. Conjunctivae are normal.  Neck: No tracheal deviation present. Thyromegaly (? right nodule) present.  Cardiovascular: Normal rate and regular rhythm.   Murmur (soft SEM RUSB) heard. Pulses:      Dorsalis pedis pulses are 2+ on the right side, and 2+ on the left side.  Respiratory: Effort normal and breath sounds normal. No respiratory distress.  GI: Soft. He exhibits no mass. There is no hepatomegaly. There is no tenderness.  Musculoskeletal: He exhibits no edema or tenderness.  Lymphadenopathy:    He has no cervical adenopathy.  Neurological: He is alert and oriented to person, place, and time. He has normal strength. Coordination and gait normal.  Skin: Skin is warm. No erythema.  Psychiatric: His mood appears anxious.  Well groomed, good eye contact.     ASSESSMENT AND PLAN:   [name] was seen today for follow-up.  Diagnoses and all orders for this visit:  Attention deficit hyperactivity disorder (ADHD), unspecified ADHD type  Well controlled. Tolerating medication well. Some side effects discussed, no changes in current management.Rx's x 3 given today.       Last refill on 11/09/16.       Med contract 08/2015. F/U in 6 months. Urine tox with next labs.   -     ADDERALL XR 20 MG 24 hr capsule; Take 1 capsule (20 mg total) by mouth daily.  Essential hypertension  Adequately controlled. No changes in current management. DASH -low salt diet to continue. Eye exam recommended annually. F/U in 6 months, before if needed.  -     BENICAR 20 MG tablet; Take 1.5 tablets (30 mg total) by mouth daily.  Hyperlipidemia, unspecified hyperlipidemia type  No changes in current  management, he is not fasting today, will come back next week for fasting labs. Will give further recommendations accordingly.  -     Comprehensive metabolic panel; Future -     Lipid panel; Future  Abnormal TSH  ? Thyroid nodule. Further recommendations will be given according to labs/imaging results.  -     T4, free; Future -     TSH; Future -     US THYROID; Future  CKD (chronic kidney disease), stage III  Stable. Continue Benicar. Low salt diet and avoidance of NSAID's.  -     Comprehensive metabolic panel; Future -     BENICAR 20 MG tablet; Take 1.5 tablets (30 mg total) by mouth daily.     -Mr. Kathreen CornfieldWalter Bernard Dyck was advised to return sooner than planned today if new concerns arise.       Fredericka Bottcher G. SwazilandJordan, MD  [facility]. [place] office.

## 2016-12-14 ENCOUNTER — Ambulatory Visit (INDEPENDENT_AMBULATORY_CARE_PROVIDER_SITE_OTHER): Payer: BLUE CROSS/BLUE SHIELD | Admitting: Family Medicine

## 2016-12-14 ENCOUNTER — Encounter: Payer: Self-pay | Admitting: Family Medicine

## 2016-12-14 VITALS — BP 132/80 | HR 83 | Resp 12 | Ht 71.0 in | Wt 206.1 lb

## 2016-12-14 DIAGNOSIS — I10 Essential (primary) hypertension: Secondary | ICD-10-CM | POA: Diagnosis not present

## 2016-12-14 DIAGNOSIS — Z79899 Other long term (current) drug therapy: Secondary | ICD-10-CM

## 2016-12-14 DIAGNOSIS — R946 Abnormal results of thyroid function studies: Secondary | ICD-10-CM | POA: Diagnosis not present

## 2016-12-14 DIAGNOSIS — N183 Chronic kidney disease, stage 3 unspecified: Secondary | ICD-10-CM

## 2016-12-14 DIAGNOSIS — E785 Hyperlipidemia, unspecified: Secondary | ICD-10-CM | POA: Diagnosis not present

## 2016-12-14 DIAGNOSIS — F909 Attention-deficit hyperactivity disorder, unspecified type: Secondary | ICD-10-CM | POA: Diagnosis not present

## 2016-12-14 DIAGNOSIS — R7989 Other specified abnormal findings of blood chemistry: Secondary | ICD-10-CM

## 2016-12-14 MED ORDER — ADDERALL XR 20 MG PO CP24: 20.0000 mg | ORAL_CAPSULE | Freq: Every day | ORAL | 0 refills | Status: DC

## 2016-12-14 MED ORDER — BENICAR 20 MG PO TABS: 30.0000 mg | ORAL_TABLET | Freq: Every day | ORAL | 2 refills | Status: DC

## 2016-12-14 NOTE — Patient Instructions (Addendum)
A few things to remember from today's visit:   Attention deficit hyperactivity disorder (ADHD), unspecified ADHD type - Plan: ADDERALL XR 20 MG 24 hr capsule, DISCONTINUED: ADDERALL XR 20 MG 24 hr capsule  Essential hypertension - Plan: BENICAR 20 MG tablet  Hyperlipidemia, unspecified hyperlipidemia type  Abnormal TSH  CKD (chronic kidney disease), stage III - Plan: BENICAR 20 MG tablet   Please be sure medication list is accurate. If a new problem present, please set up appointment sooner than planned today.

## 2016-12-23 ENCOUNTER — Other Ambulatory Visit (INDEPENDENT_AMBULATORY_CARE_PROVIDER_SITE_OTHER): Payer: BLUE CROSS/BLUE SHIELD

## 2016-12-23 DIAGNOSIS — R946 Abnormal results of thyroid function studies: Secondary | ICD-10-CM | POA: Diagnosis not present

## 2016-12-23 DIAGNOSIS — Z79899 Other long term (current) drug therapy: Secondary | ICD-10-CM

## 2016-12-23 DIAGNOSIS — N183 Chronic kidney disease, stage 3 unspecified: Secondary | ICD-10-CM

## 2016-12-23 DIAGNOSIS — E785 Hyperlipidemia, unspecified: Secondary | ICD-10-CM

## 2016-12-23 DIAGNOSIS — F909 Attention-deficit hyperactivity disorder, unspecified type: Secondary | ICD-10-CM

## 2016-12-23 DIAGNOSIS — R7989 Other specified abnormal findings of blood chemistry: Secondary | ICD-10-CM

## 2016-12-23 LAB — COMPREHENSIVE METABOLIC PANEL
ALK PHOS: 63 U/L (ref 39–117)
ALT: 16 U/L (ref 0–53)
AST: 18 U/L (ref 0–37)
Albumin: 4.1 g/dL (ref 3.5–5.2)
BUN: 19 mg/dL (ref 6–23)
CO2: 29 meq/L (ref 19–32)
CREATININE: 1.28 mg/dL (ref 0.40–1.50)
Calcium: 9.3 mg/dL (ref 8.4–10.5)
Chloride: 107 mEq/L (ref 96–112)
GFR: 61.39 mL/min (ref >60.00)
GLUCOSE: 82 mg/dL (ref 70–99)
Potassium: 4.4 mEq/L (ref 3.5–5.1)
Sodium: 141 mEq/L (ref 135–145)
TOTAL PROTEIN: 6.3 g/dL (ref 6.0–8.3)
Total Bilirubin: 0.6 mg/dL (ref 0.2–1.2)

## 2016-12-23 LAB — T4, FREE: FREE T4: 0.6 ng/dL (ref 0.60–1.60)

## 2016-12-23 LAB — LIPID PANEL
Cholesterol: 208 mg/dL — ABNORMAL HIGH (ref 0–200)
HDL: 52.8 mg/dL (ref >39.00)
LDL Cholesterol: 138 mg/dL — ABNORMAL HIGH (ref 0–99)
NONHDL: 154.99
Total CHOL/HDL Ratio: 4
Triglycerides: 86 mg/dL (ref 0.0–149.0)
VLDL: 17.2 mg/dL (ref 0.0–40.0)

## 2016-12-23 LAB — TSH: TSH: 16.73 u[IU]/mL — AB (ref 0.35–4.50)

## 2016-12-24 ENCOUNTER — Encounter: Payer: Self-pay | Admitting: Family Medicine

## 2016-12-24 ENCOUNTER — Other Ambulatory Visit: Payer: Self-pay | Admitting: Family Medicine

## 2016-12-24 DIAGNOSIS — E039 Hypothyroidism, unspecified: Secondary | ICD-10-CM

## 2016-12-24 MED ORDER — LEVOTHYROXINE SODIUM 50 MCG PO TABS: 50.0000 ug | ORAL_TABLET | Freq: Every day | ORAL | 0 refills | Status: DC

## 2016-12-24 NOTE — Progress Notes (Signed)
levothyr

## 2016-12-30 ENCOUNTER — Telehealth: Payer: Self-pay | Admitting: Family Medicine

## 2016-12-30 DIAGNOSIS — E039 Hypothyroidism, unspecified: Secondary | ICD-10-CM

## 2016-12-30 MED ORDER — LEVOTHYROXINE SODIUM 50 MCG PO TABS
50.0000 ug | ORAL_TABLET | Freq: Every day | ORAL | 1 refills | Status: DC
Start: 2016-12-30 — End: 2017-01-12

## 2016-12-30 NOTE — Telephone Encounter (Signed)
Pt would like a 30 day  levothyroxine (SYNTHROID, LEVOTHROID) 50 MCG tablet  Sent asap to local pharmacy so he can get started prior to the mail order getting to him.  [id number] - [place], [place] - [address] AT [facility] [address]

## 2016-12-30 NOTE — Telephone Encounter (Signed)
Rx sent to the [facility] on file.

## 2017-01-04 ENCOUNTER — Ambulatory Visit
Admission: RE | Admit: 2017-01-04 | Discharge: 2017-01-04 | Disposition: A | Discharge status: Home / Self Care | Payer: BLUE CROSS/BLUE SHIELD | Source: Ambulatory Visit | Attending: Family Medicine | Admitting: Family Medicine

## 2017-01-04 DIAGNOSIS — R7989 Other specified abnormal findings of blood chemistry: Secondary | ICD-10-CM

## 2017-01-08 ENCOUNTER — Encounter: Payer: Self-pay | Admitting: Family Medicine

## 2017-01-11 ENCOUNTER — Telehealth: Payer: Self-pay | Admitting: Family Medicine

## 2017-01-11 NOTE — Telephone Encounter (Signed)
Pt would like a call back to discus his tsh med. Pt states [facility] sent him a generic brand and pharmacy told him he could not switch that because it was not the same brand.  Also wants to discuss thyroid biopsy results.  A Pt cannot get on mychart at the present.

## 2017-01-11 NOTE — Telephone Encounter (Signed)
I called and spoke with patient. He is going to call the office back tomorrow and let us know which manufacturer he picked up from the local pharmacy and which one [facility] sent him to make sure they are the same. Patient is aware of his thyroid ultrasound results and verbalized understanding.

## 2017-01-12 ENCOUNTER — Telehealth: Payer: Self-pay

## 2017-01-12 DIAGNOSIS — E039 Hypothyroidism, unspecified: Secondary | ICD-10-CM

## 2017-01-12 MED ORDER — LEVOTHYROXINE SODIUM 50 MCG PO TABS: 50.0000 ug | ORAL_TABLET | Freq: Every day | ORAL | 0 refills | Status: DC

## 2017-01-12 NOTE — Telephone Encounter (Signed)
He is jut going to start this medication, so any generic if fine. I would like to have same generic for future Rx's but we do not have much control at this time. Since he is going to continue filling it through mail order,I hope they continue filling same one.  Thanks, BJ

## 2017-01-12 NOTE — Telephone Encounter (Signed)
Local pharmacy filled the Rx with the Lannett brand of the medication. I have created a separate phone note and sent it to Dr. Swaziland to see if it is okay for patient to take the Mylan bottle that [facility] sent him. Please see phone note.

## 2017-01-12 NOTE — Telephone Encounter (Signed)
° ° °  [name] pt called back and said MYLAN-LEVOTHYROX TB IS WHAT [facility] is trying to send him  The below med is what Dr Swaziland RX him    levothyroxine (SYNTHROID, LEVOTHROID) 50 MCG tablet

## 2017-01-12 NOTE — Telephone Encounter (Signed)
Patient's local pharmacy filled his levothyroxine with the Lannett brand and [facility] filled the rx with the Mylan brand. Patient had been told that he needed to stay on the same manufacturer once he started taking the medication. Is it okay for patient to take the Mylan brand or does it need to be switched to the Lannett that he originally started with?   Thanks!

## 2017-01-12 NOTE — Telephone Encounter (Signed)
I called and spoke with patient. He is wanting to stay on the Lannett brand since the pharmacist told him not to switch the brands once he's been taking the medication for a couple of weeks. New Rx sent to [facility] specifying to use the Lannett brand of the Levothyroxine. Patient is aware.

## 2017-02-02 ENCOUNTER — Other Ambulatory Visit: Payer: Self-pay | Admitting: *Deleted

## 2017-02-02 DIAGNOSIS — I10 Essential (primary) hypertension: Secondary | ICD-10-CM

## 2017-02-02 DIAGNOSIS — N183 Chronic kidney disease, stage 3 unspecified: Secondary | ICD-10-CM

## 2017-02-02 MED ORDER — BENICAR 20 MG PO TABS: 30.0000 mg | ORAL_TABLET | Freq: Every day | ORAL | 2 refills | Status: DC

## 2017-02-05 ENCOUNTER — Other Ambulatory Visit: Payer: Self-pay | Admitting: Family Medicine

## 2017-02-05 DIAGNOSIS — I10 Essential (primary) hypertension: Secondary | ICD-10-CM

## 2017-02-05 DIAGNOSIS — N183 Chronic kidney disease, stage 3 unspecified: Secondary | ICD-10-CM

## 2017-02-07 ENCOUNTER — Other Ambulatory Visit: Payer: Self-pay | Admitting: Family Medicine

## 2017-02-07 DIAGNOSIS — E039 Hypothyroidism, unspecified: Secondary | ICD-10-CM

## 2017-02-09 ENCOUNTER — Telehealth: Payer: Self-pay | Admitting: Family Medicine

## 2017-02-09 DIAGNOSIS — N183 Chronic kidney disease, stage 3 unspecified: Secondary | ICD-10-CM

## 2017-02-09 DIAGNOSIS — I10 Essential (primary) hypertension: Secondary | ICD-10-CM

## 2017-02-09 MED ORDER — BENICAR 20 MG PO TABS: 30.0000 mg | ORAL_TABLET | Freq: Every day | ORAL | 2 refills | Status: DC

## 2017-02-09 NOTE — Telephone Encounter (Signed)
° ° °  [facility] said they did not received the below med  Ref [id number]  1 [phone]   request refill of the following:   BENICAR 20 MG tablet   Phamacy:  [facility]

## 2017-02-09 NOTE — Telephone Encounter (Signed)
Rx resent to pharmacy

## 2017-02-18 ENCOUNTER — Telehealth: Payer: Self-pay | Admitting: Family Medicine

## 2017-02-18 ENCOUNTER — Other Ambulatory Visit (INDEPENDENT_AMBULATORY_CARE_PROVIDER_SITE_OTHER): Payer: BLUE CROSS/BLUE SHIELD

## 2017-02-18 DIAGNOSIS — E039 Hypothyroidism, unspecified: Secondary | ICD-10-CM

## 2017-02-18 LAB — TSH: TSH: 5.1 u[IU]/mL — ABNORMAL HIGH (ref 0.35–4.50)

## 2017-02-18 NOTE — Telephone Encounter (Signed)
Pt is state the he needs a refill on Rx Levothyroxine the comes from manufacture Lannett (only) and no other brand name or manufacture  Pharm:  [facility]

## 2017-02-19 ENCOUNTER — Other Ambulatory Visit: Payer: Self-pay | Admitting: *Deleted

## 2017-02-19 DIAGNOSIS — E039 Hypothyroidism, unspecified: Secondary | ICD-10-CM

## 2017-02-19 MED ORDER — LEVOTHYROXINE SODIUM 50 MCG PO TABS: 50.0000 ug | ORAL_TABLET | Freq: Every day | ORAL | 0 refills | Status: DC

## 2017-02-19 NOTE — Telephone Encounter (Signed)
Rx sent to pharmacy   

## 2017-02-21 ENCOUNTER — Other Ambulatory Visit: Payer: Self-pay | Admitting: Family Medicine

## 2017-02-21 ENCOUNTER — Encounter: Payer: Self-pay | Admitting: Family Medicine

## 2017-02-21 DIAGNOSIS — E039 Hypothyroidism, unspecified: Secondary | ICD-10-CM

## 2017-03-01 ENCOUNTER — Telehealth: Payer: Self-pay | Admitting: Family Medicine

## 2017-03-01 NOTE — Telephone Encounter (Signed)
Patient needs Rx refill adjusted for new dosing.

## 2017-03-01 NOTE — Telephone Encounter (Signed)
Copied from CRM #[id number]. Topic: Quick Communication - Rx Refill/Question °>> Mar 01, 2017 12:41 PM Harrald, Kathy J wrote: °Pt states [facility] does not have the rx levothyroxine (SYNTHROID, LEVOTHROID) 50 MCG tablet  (brand name lannett) ° °Pt has taken his last pill and needs a 30 day sent to [id number] - [place], [place] - [address] [phone] (Phone) [phone] (Fax) ° °EXCEPT pt states he just received a message from Dr Jordan that dr is increasing his levothyroxine to 1 1/2 tabs, so will need to be the new dosage. ° °So needs to be resent to [facility] as well ° °Agent: Please be advised that RX refills may take up to 48 hours. We ask that you follow-up with your pharmacy. °

## 2017-03-01 NOTE — Telephone Encounter (Deleted)
Copied from CRM [id number]. Topic: Quick Communication - Rx Refill/Question >> Mar 01, 2017 12:41 PM Crist InfanteHarrald, Kathy J wrote: Pt states [facility] does not have the rx levothyroxine (SYNTHROID, LEVOTHROID) 50 MCG tablet  (brand name lannett)  Pt has taken his last pill and [facility] a [id number] [place] - [phone] [facility] [address] [phone] (Phone) [phone] (Fax)  EXCEPT pt states he just received a message from Dr SwazilandJordan that dr is increasing his levothyroxine to 1 1/2 tabs, so will need to be the new dosage.  So needs to be [facility] to [facility] as well  Agent: Please be advised that RX refills may take up to 48 hours. We ask that you follow-up with your pharmacy.

## 2017-03-02 ENCOUNTER — Other Ambulatory Visit: Payer: Self-pay | Admitting: Family Medicine

## 2017-03-02 ENCOUNTER — Telehealth: Payer: Self-pay | Admitting: Family Medicine

## 2017-03-02 DIAGNOSIS — E039 Hypothyroidism, unspecified: Secondary | ICD-10-CM

## 2017-03-02 MED ORDER — LEVOTHYROXINE SODIUM 50 MCG PO TABS: ORAL_TABLET | ORAL | 1 refills | Status: DC

## 2017-03-02 MED ORDER — LEVOTHYROXINE SODIUM 50 MCG PO TABS: ORAL_TABLET | ORAL | 0 refills | Status: DC

## 2017-03-02 NOTE — Telephone Encounter (Signed)
Copied from CRM [id number]. Topic: Quick Communication - Rx Refill/Question >> Mar 01, 2017 12:41 PM Crist InfanteHarrald, Kathy J wrote: [facility] states [facility] not [facility] the rx levothyroxine (SYNTHROID, LEVOTHROID) 50 MCG tablet  (brand name lannett)  Pt has taken his last pill and needs a 30 day [id number] [place] - [phone] [facility] [address] [phone] (Phone) [phone] (Fax)  EXCEPT pt states he just received a message from Dr SwazilandJordan that dr is increasing his levothyroxine to 1 1/2 tabs, so will need to be the new dosage.  So needs to be [facility] as well  Agent: Please be advised that RX refills may take up to 48 hours. We ask that you follow-up with your pharmacy. >> Mar 02, 2017 10:36 AM Cipriano BunkerLambe, Annette S wrote: He got 2 pills for today and tomorrow, but needs some to tie him for until he gets prescription [facility].    [facility] [place] [facility] [place] [facility] [facility] [address]

## 2017-03-02 NOTE — Telephone Encounter (Signed)
Rx sent. ?BJ ?

## 2017-03-02 NOTE — Telephone Encounter (Signed)
Rx for thyroid medication,brand name as requested,were sent to mail order and local pharmacy.  Thanks, BJ

## 2017-03-10 NOTE — Telephone Encounter (Signed)
Copied from CRM #[id number]. Topic: Quick Communication - Rx Refill/Question >> Mar 10, 2017  3:40 PM Darletta MollLander, Lumin L wrote: Has the patient contacted their pharmacy? Yes.   (Agent: If no, request that the patient contact the pharmacy for the refill.) Preferred Pharmacy (with phone number or street name): [id number] - [place], [place] - [address] AT [facility] [address] [phone] (Phone) [phone]) Agent: Please be advised that RX refills may take up to 3 business days. We ask that you follow-up with your pharmacy. Clarify instructions for levothyroxine.

## 2017-03-11 NOTE — Telephone Encounter (Signed)
Dr. SwazilandJordan, please advise.  The mychart message says to take 1 tablet by mouth for 5 days and 1.5 tablets for 2 days. The Rx that was sent in says to take 1 tablet by mouth for 6 days, and 1.5 tablets for 1 day.

## 2017-03-11 NOTE — Telephone Encounter (Signed)
See Rx in med list for Levothyroxine; [facility] is [facility] clarification on the instructions. Thank you.

## 2017-03-12 ENCOUNTER — Telehealth: Payer: Self-pay | Admitting: Family Medicine

## 2017-03-12 NOTE — Telephone Encounter (Signed)
Copied from CRM [id number]. Topic: Quick Communication - See Telephone Encounter >> Mar 12, 2017 11:41 AM Jolayne Hainesaylor, Brittany L wrote: CRM for notification. See Telephone encounter for:  Pt request his ad[facility]all refill , [facility] [place] [facility] [address]   Pt also states that Dr SwazilandJordan wrote him a script for LEVOTHYROXINE 50mg  :: She told him to take 1 1/2 tabs for 2 days then 1 tab every day after that for the rest of the week, but the pharmacy stated on the bottle that 1 1/2 tabs for one day instead of 2 days. He wants clarification on this please. 03/12/17.

## 2017-03-16 NOTE — Telephone Encounter (Signed)
Adderall refill request Patient should be on daily dosing now with his thyroid medication

## 2017-03-16 NOTE — Telephone Encounter (Signed)
Levothyroxine 50 mcg 1 tab daily for 5 days and 1.5 tab for 2 days.  Thanks, BJ

## 2017-03-17 ENCOUNTER — Telehealth: Payer: Self-pay | Admitting: Family Medicine

## 2017-03-17 ENCOUNTER — Other Ambulatory Visit: Payer: Self-pay | Admitting: *Deleted

## 2017-03-17 DIAGNOSIS — E039 Hypothyroidism, unspecified: Secondary | ICD-10-CM

## 2017-03-17 MED ORDER — LEVOTHYROXINE SODIUM 50 MCG PO TABS: 50.0000 ug | ORAL_TABLET | Freq: Every day | ORAL | 0 refills | Status: DC

## 2017-03-17 NOTE — Telephone Encounter (Signed)
Spoke with pharmacy, clarified medication.

## 2017-03-17 NOTE — Telephone Encounter (Signed)
Copied from CRM [id number]. Topic: General - Other >> Mar 17, 2017  3:32 PM Percival SpanishKennedy, Cheryl W wrote:   [facility] thyroid medication. Pt received something different thru his mychart than what the pharmacy received for his rx and they would like a call back with clarification   Pt also req refill on his  ADDERALL XR 20 MG 24 hr capsule  [facility]  [address] [place]  [phone]

## 2017-03-18 ENCOUNTER — Other Ambulatory Visit: Payer: Self-pay | Admitting: Family Medicine

## 2017-03-18 ENCOUNTER — Telehealth: Payer: Self-pay | Admitting: Family Medicine

## 2017-03-18 DIAGNOSIS — E039 Hypothyroidism, unspecified: Secondary | ICD-10-CM

## 2017-03-18 DIAGNOSIS — F909 Attention-deficit hyperactivity disorder, unspecified type: Secondary | ICD-10-CM

## 2017-03-18 MED ORDER — ADDERALL XR 20 MG PO CP24: 20.0000 mg | ORAL_CAPSULE | Freq: Every day | ORAL | 0 refills | Status: DC

## 2017-03-18 MED ORDER — LEVOTHYROXINE SODIUM 50 MCG PO TABS: 50.0000 ug | ORAL_TABLET | Freq: Every day | ORAL | 0 refills | Status: DC

## 2017-03-18 NOTE — Telephone Encounter (Signed)
Copied from CRM [id number]. Topic: Quick Communication - See Telephone Encounter >> Mar 18, 2017  4:55 PM Rudi CocoLathan, Aalijah Lanphere M, NT wrote: CRM for notification. See Telephone encounter for:   03/18/17. [facility] and ask [facility] rx. For adderall for pt. To be able to afford rx.   [id number] - [place], [place] - [address] AT [facility] [address] [place] [id number] Phone: [phone] Fax: [phone] Not a 24 hour pharmacy; exact hours not known

## 2017-03-18 NOTE — Telephone Encounter (Signed)
I have sent this Rx twice. Can you please call it in to his pharmacy. Thanks, BJ

## 2017-03-18 NOTE — Telephone Encounter (Signed)
Rx's for Adderall x 3 and clarified Rx for Levothyroxine sent to his pharmacy. Thanks, BJ

## 2017-03-18 NOTE — Telephone Encounter (Signed)
Dr. SwazilandJordan - Please resend as generic and for 37month supply.   Thanks!

## 2017-03-19 ENCOUNTER — Telehealth: Payer: Self-pay | Admitting: Family Medicine

## 2017-03-19 NOTE — Telephone Encounter (Signed)
[name] called in to receive the verbal okay to change Adderall Rx to the generic. Pharmacist says that they can not change with DAW on Rx, please call back.     CB: [phone]

## 2017-03-19 NOTE — Telephone Encounter (Signed)
Spoke with pharmacy concerning Rx.

## 2017-03-19 NOTE — Telephone Encounter (Signed)
Spoke with pharmacy concerning Rx's.

## 2017-05-26 ENCOUNTER — Other Ambulatory Visit: Payer: Self-pay | Admitting: *Deleted

## 2017-05-26 ENCOUNTER — Telehealth: Payer: Self-pay | Admitting: Family Medicine

## 2017-05-26 MED ORDER — LEVOTHYROXINE SODIUM 50 MCG PO TABS: 50.0000 ug | ORAL_TABLET | Freq: Every day | ORAL | 1 refills | Status: DC

## 2017-05-26 NOTE — Telephone Encounter (Signed)
Rx for brand name sent to pharmacy

## 2017-05-26 NOTE — Telephone Encounter (Signed)
Copied from CRM [id number]. Topic: Quick Communication - Rx Refill/Question >> May 26, 2017  9:06 AM Maia Pettiesrtiz, Kristie S wrote: Medication: levothyroxine 50mcg - [facility] changed manufacturer on generic and pt is having a bad skin reaction - requesting new RX for "brand name Synthroid 50mcg DAW1 no substitution" Preferred Pharmacy (with phone number or street name): [id number] [place] - [address] AT [facility] [address] [phone] (Phone) [phone] (Fax)

## 2017-05-27 ENCOUNTER — Telehealth: Payer: Self-pay | Admitting: *Deleted

## 2017-05-27 NOTE — Telephone Encounter (Signed)
[facility]ed a note requesting a prior auth for Synthroid 50mg .  Note states unable to complete via [email or web address] and number was given to call to have a form downloaded from [email or web address].  [employer] form completed and faxed with TSH results from 02/18/17 and 12/23/16 to [phone]

## 2017-05-28 NOTE — Telephone Encounter (Signed)
Fax received from [employer] stating the request was dismissed as the drug does not require prior auth.  I called [facility] and informed [place] of this.

## 2017-06-09 ENCOUNTER — Ambulatory Visit (INDEPENDENT_AMBULATORY_CARE_PROVIDER_SITE_OTHER): Payer: 59 | Admitting: Family Medicine

## 2017-06-09 ENCOUNTER — Encounter: Payer: Self-pay | Admitting: Family Medicine

## 2017-06-09 VITALS — BP 140/90 | HR 89 | Temp 98.5°F | Wt 206.3 lb

## 2017-06-09 DIAGNOSIS — F909 Attention-deficit hyperactivity disorder, unspecified type: Secondary | ICD-10-CM | POA: Diagnosis not present

## 2017-06-09 DIAGNOSIS — R82998 Other abnormal findings in urine: Secondary | ICD-10-CM | POA: Diagnosis not present

## 2017-06-09 DIAGNOSIS — R109 Unspecified abdominal pain: Secondary | ICD-10-CM | POA: Diagnosis not present

## 2017-06-09 DIAGNOSIS — R69 Illness, unspecified: Secondary | ICD-10-CM | POA: Diagnosis not present

## 2017-06-09 LAB — POCT URINALYSIS DIPSTICK
BILIRUBIN UA: NEGATIVE
Glucose, UA: NEGATIVE
Ketones, UA: NEGATIVE
LEUKOCYTES UA: NEGATIVE
NITRITE UA: NEGATIVE
PH UA: 5 (ref 5.0–8.0)
Spec Grav, UA: 1.025 (ref 1.010–1.025)
UROBILINOGEN UA: 0.2 U/dL

## 2017-06-09 MED ORDER — AMPHETAMINE-DEXTROAMPHET ER 20 MG PO CP24: 20.0000 mg | ORAL_CAPSULE | Freq: Every day | ORAL | 0 refills | Status: DC

## 2017-06-09 MED ORDER — ADDERALL XR 20 MG PO CP24: 20.0000 mg | ORAL_CAPSULE | Freq: Every day | ORAL | 0 refills | Status: DC

## 2017-06-09 NOTE — Progress Notes (Signed)
Subjective:     Patient ID: [name], male   DOB: May 08, 1960, 57 y.o.   MRN: [id number]  HPI Patient seen as a work in with three-day history of "dark" colored urine and 1 day history of mild left flank pain. He states he had kidney stones about 15 years ago. Does not have any pain consistent with prior stones. 3 days he had noted some "tea-colored "urine. No abdominal pain. No fevers or chills. No burning with urination.  No stool color changes.  Left flank pain started earlier today and is very mild- about 2 out of 10 severity and dull and not radiating. No alleviating or exacerbating features  Past Medical History:  Diagnosis Date  . ADHD   . Chronic kidney disease   . GERD (gastroesophageal reflux disease)   . Hypertension   . Migraine headache without aura    Past Surgical History:  Procedure Laterality Date  . HERNIA REPAIR  2010   Bilateral inguinal    reports that  has never smoked. he has never used smokeless tobacco. He reports that he drinks alcohol. He reports that he does not use drugs. family history includes Cancer in his paternal aunt and paternal grandmother; Mental illness in his mother. Allergies  Allergen Reactions  . Sulfa Antibiotics Other (See Comments)    diarrhea  . Benazepril Hcl Swelling    Throat "closing up"  . Penicillins Other (See Comments)    Unknown     Review of Systems  Constitutional: Negative for chills and fever.  Respiratory: Negative for shortness of breath.   Cardiovascular: Negative for chest pain.  Gastrointestinal: Negative for abdominal pain.  Genitourinary: Positive for flank pain and hematuria. Negative for difficulty urinating, dysuria and urgency.       Objective:   Physical Exam  Constitutional: He appears well-developed and well-nourished.  Cardiovascular: Normal rate and regular rhythm.  Pulmonary/Chest: Effort normal and breath sounds normal. No respiratory distress. He has no wheezes. He has no rales.   Abdominal: Soft. He exhibits no mass. There is no tenderness. There is no rebound and no guarding.  Musculoskeletal: He exhibits no edema.       Assessment:     Patient resents with three-day history of dark-colored urine and 1 day history of very mild left flank pain. Urine dipstick reveals large blood otherwise negative. No urobilinogen  he does not have any leukocytes or nitrites to suggest likely infection. Question recurrent kidney stone versus other    Plan:     -Urine sent for reflex microscopy -Stay very well-hydrated -Set up CT renal stone study -Follow-up immediately for any fever or worsening pain -Consider urology referral if above study unrevealing and microscopy confirms greater than 3 red cells per high-power field -Offered pain medication for worsening flank pain and patient declines  Kristian CoveyBruce W Christana Angelica MD [facility]

## 2017-06-09 NOTE — Patient Instructions (Signed)
We are setting up CT renal stone study and you should get a call next couple of days about that.  Stay well hydrated  Follow up for any fever or increasing pain.

## 2017-06-10 ENCOUNTER — Telehealth: Payer: Self-pay | Admitting: Family Medicine

## 2017-06-10 ENCOUNTER — Other Ambulatory Visit (INDEPENDENT_AMBULATORY_CARE_PROVIDER_SITE_OTHER): Payer: 59

## 2017-06-10 DIAGNOSIS — E039 Hypothyroidism, unspecified: Secondary | ICD-10-CM

## 2017-06-10 LAB — URINALYSIS, ROUTINE W REFLEX MICROSCOPIC
BILIRUBIN URINE: NEGATIVE
KETONES UR: NEGATIVE
LEUKOCYTES UA: NEGATIVE
NITRITE: NEGATIVE
Specific Gravity, Urine: >=1.03 — AB (ref 1.000–1.030)
Total Protein, Urine: NEGATIVE
URINE GLUCOSE: NEGATIVE
UROBILINOGEN UA: 0.2 (ref 0.0–1.0)
pH: 5.5 (ref 5.0–8.0)

## 2017-06-10 LAB — TSH: TSH: 1.26 u[IU]/mL (ref 0.35–4.50)

## 2017-06-10 MED ORDER — LEVOTHYROXINE SODIUM 50 MCG PO TABS: ORAL_TABLET | ORAL | 0 refills | Status: DC

## 2017-06-10 NOTE — Telephone Encounter (Signed)
Chart reviewed, sig should be as written below.  New rx filled to pharmacy as requested.  Attempted to call [name], but phone kept cutting out so I was unable to get through. Called patient and left message to return call. He needs follow-up TSH labs prior to next refill. Orders have already been placed.

## 2017-06-10 NOTE — Telephone Encounter (Signed)
[facility] [place] [address] Is calling for clarifications on his levothyroxine.  She states that the patient is stating that he takes 1 tablet for 5 days and then take 1 tablet on Monday, Thursday, Friday, Saturday and Sunday and takes 1.5 tab on Tues and Wednesday.  PCP is Dr. [name] SwazilandJordan. LOV   06/09/17.  He is at the pharmacy right now. Please review!

## 2017-06-11 ENCOUNTER — Ambulatory Visit (INDEPENDENT_AMBULATORY_CARE_PROVIDER_SITE_OTHER)
Admission: RE | Admit: 2017-06-11 | Discharge: 2017-06-11 | Disposition: A | Discharge status: Home / Self Care | Payer: 59 | Source: Ambulatory Visit | Attending: Family Medicine | Admitting: Family Medicine

## 2017-06-11 ENCOUNTER — Encounter: Payer: Self-pay | Admitting: Family Medicine

## 2017-06-11 DIAGNOSIS — R109 Unspecified abdominal pain: Secondary | ICD-10-CM

## 2017-06-17 DIAGNOSIS — R911 Solitary pulmonary nodule: Secondary | ICD-10-CM | POA: Insufficient documentation

## 2017-06-17 DIAGNOSIS — N200 Calculus of kidney: Secondary | ICD-10-CM | POA: Insufficient documentation

## 2017-06-17 NOTE — Progress Notes (Signed)
HPI:   [name] is a 57 y.o. male, who is here today with his wife for 6 months follow up.   He was last seen on 12/14/16.   Since his last OV he was seen for acute visit, 06/09/17,dark urine.  She is very concerned about some of the CT findings and would like to go through report. He is also concerned about other possible causes of gross hematuria.  CT renal stone:  1. 5 mm stone at the right UPJ with minimal dilatation of the right renal pelvis.  2.   No evidence for left kidney stones.  3.   Bilateral renal cysts.  Probable hepatic cyst. 4. Question a tiny pleural-based nodule in the right middle lobe. No follow-up needed if patient is low-risk.  Non-contrast chest CT can be considered in 12 months if patient is high-risk.  5.   Left inguinal hernia containing fat.  No history of tobacco use but he reports that his father had metastatic lung cancer, primary pancreatic cancer.  He has history of nephrolithiasis. He followed with urologist a few years ago, last seen about 7-8 years ago. Gross hematuria has resolved but he still has some left lower back pain.    He is also reporting history of chronic back pain, which we have not discussed in the past. Pain is sometimes radiated to lateral aspect of left lower extremity.  He is also having a inner left thigh cramps at night that wakes him up. This has been going on also for a few years, it happens 1 or twice per month, "severe" He denies numbness or tingling, saddle anesthesia, urine or bowel incontinence. He has no noted fever, chills, abnormal weight loss.  CT show musculoskeletal: Severe disc space narrowing at T12-L1 and L1-L2. Degenerative endplate changes at the levels of disc space narrowing. Degenerative facet disease in the lower lumbar spine.   ADHD:  Currently he is on Adderall XR 20 mg daily. He is tolerating medication well, no side effects reported.  Medication is still helping  with concentration and with finishing task on time.  Hypertension:   Chronic. Currently on Benicar 20 mg daily.    He is taking medications as instructed, no side effects reported.  He has not noted unusual headache, visual changes, exertional chest pain, dyspnea,  focal weakness, or edema.  CKD III, renal function has improved.    Lab Results  Component Value Date   CREATININE 1.28 12/23/2016   BUN 19 12/23/2016   NA 141 12/23/2016   K 4.4 12/23/2016   CL 107 12/23/2016   CO2 29 12/23/2016    Hypothyroidism:  Currently he is on Levothyroxine 50 mcg daily.  Complaining about "allergy" to one of the genetic brands, so he is currently on brand Synthroid.  He has not noted dysphagia,abdominal pain, changes in bowel habits, tremor, cold/heat intolerance, or abnormal weight loss.  Lab Results  Component Value Date   TSH 1.26 06/10/2017     Hyperlipidemia:  Currently on Simvastatin 20 mg daily. Following a low fat diet: Yes.  He has not noted side effects with medication.  Lab Results  Component Value Date   CHOL 208 (H) 12/23/2016   HDL 52.80 12/23/2016   LDLCALC 138 (H) 12/23/2016   TRIG 86.0 12/23/2016   CHOLHDL 4 12/23/2016     Review of Systems  Constitutional: Negative for activity change, appetite change, fatigue and fever.  HENT: Negative for nosebleeds, sore throat and trouble  swallowing.   Eyes: Negative for redness and visual disturbance.  Respiratory: Negative for apnea, cough, shortness of breath and wheezing.   Cardiovascular: Negative for chest pain, palpitations and leg swelling.  Gastrointestinal: Negative for abdominal pain, nausea and vomiting.       No changes in bowel habits.  Endocrine: Negative for cold intolerance and heat intolerance.  Genitourinary: Negative for decreased urine volume, dysuria, flank pain and urgency.  Musculoskeletal: Positive for back pain and myalgias. Negative for gait problem.  Skin: Negative for rash and  wound.  Neurological: Negative for dizziness, syncope, weakness, numbness and headaches.  Hematological: Negative for adenopathy. Does not bruise/bleed easily.  Psychiatric/Behavioral: Negative for confusion and sleep disturbance. The patient is nervous/anxious.      Current Outpatient Medications on File Prior to Visit  Medication Sig Dispense Refill  . BENICAR 20 MG tablet Take 1.5 tablets (30 mg total) daily by mouth. 135 tablet 2  . simvastatin (ZOCOR) 20 MG tablet Take 1 tablet (20 mg total) by mouth daily. 90 tablet 1   No current facility-administered medications on file prior to visit.      Past Medical History:  Diagnosis Date  . ADHD   . Chronic kidney disease   . GERD (gastroesophageal reflux disease)   . Hypertension   . Migraine headache without aura    Allergies  Allergen Reactions  . Sulfa Antibiotics Other (See Comments)    diarrhea  . Benazepril Hcl Swelling    Throat "closing up"  . Penicillins Other (See Comments)    Unknown   Family History  Problem Relation Age of Onset  . Mental illness Mother   . Cancer Paternal Aunt        lung  . Cancer Paternal Grandmother        lung  . Diabetes Neg Hx     Social History   Socioeconomic History  . Marital status: Married    Spouse name: None  . Number of children: None  . Years of education: None  . Highest education level: None  Social Needs  . Financial resource strain: None  . Food insecurity - worry: None  . Food insecurity - inability: None  . Transportation needs - medical: None  . Transportation needs - non-medical: None  Occupational History  . None  Tobacco Use  . Smoking status: Never Smoker  . Smokeless tobacco: Never Used  Substance and Sexual Activity  . Alcohol use: Yes    Alcohol/week: 0.0 oz    Comment: occasionally  . Drug use: No  . Sexual activity: Yes  Other Topics Concern  . None  Social History Narrative  . None    Vitals:   06/18/17 0830  BP: 130/86  Pulse:  82  Resp: 12  Temp: 98.4 F (36.9 C)  SpO2: 97%   Body mass index is 28.33 kg/m.   Physical Exam  Nursing note and vitals reviewed. Constitutional: He is oriented to person, place, and time. He appears well-developed and well-nourished. No distress.  HENT:  Head: Normocephalic and atraumatic.  Mouth/Throat: Oropharynx is clear and moist and mucous membranes are normal.  Eyes: Conjunctivae are normal. Pupils are equal, round, and reactive to light.  Neck: No tracheal deviation present. Thyromegaly present. No thyroid mass present.  Cardiovascular: Normal rate and regular rhythm.  No murmur heard. Pulses:      Dorsalis pedis pulses are 2+ on the right side, and 2+ on the left side.  Respiratory: Effort normal and breath sounds  normal. No respiratory distress.  GI: Soft. He exhibits no mass. There is no hepatomegaly. There is no tenderness.  Musculoskeletal: He exhibits no edema.       Lumbar back: He exhibits tenderness. He exhibits no bony tenderness.       Back:  Pain upon palpation of paraspinal lumbar muscles on left side.  Lymphadenopathy:    He has no cervical adenopathy.  Neurological: He is alert and oriented to person, place, and time. He has normal strength. No cranial nerve deficit. Coordination and gait normal.  SLR negative bilateral.  Skin: Skin is warm. No erythema.  Psychiatric: His mood appears anxious.  Well groomed, good eye contact.       ASSESSMENT AND PLAN:   [name] was seen today for 6 months follow-up.  Orders Placed This Encounter  Procedures  . Comprehensive metabolic panel  . Lipid panel  . Ambulatory referral to Orthopedic Surgery  . Ambulatory referral to Urology    Attention deficit hyperactivity disorder (ADHD) Stable on Adderall XR 20 mg Aware of side effects of medication. No changes in current management Med contract signed today. F/U in 6 months.   Essential hypertension Adequately controlled. No  changes in current management. Low-salt diet to continue. Eye exam recommended annually. F/U in 6 months, before if needed.   Hyperlipidemia He is not fasting today, so he will be back next week for fasting labs. No changes in current management. Continue low-fat diet. Follow-up in 6-12 months.  Back pain Chronic, referral to orthopedist was placed as requested. PT exercises recommended, he is familiar with "stretching" exercises that have helped in the past.  Nephrolithiasis Adequate hydration. Referral to urologist was placed, he will arrange appointment with provider he saw about 7 years ago. Monitor for warning signs.  Renal cyst Reassured, most likely benign. He will continue following with urologist.  Nodule of middle lobe of right lung We discussed current recommendations based on size and risk factors. He has no history of tobacco use, so the probability of this being malignant is low.  We can follow with chest CT in 1 year.     8:46 Am to 9:24 Am face to face OV. > 50% was dedicated to discussion of Dx + differential diagnosis, prognosis, treatment options, and coordination of care.  We went through the full CT report, we discussed all abnormality described on CT in detail.  Explained that most seem to be benign. Very concerned also about left adrenal low-density nodule, which is also most likely benign. 1.5 cm low-density structure in the anterior left hepatic lobe, explained that most hepatic lesions are benign.  He agrees with holding on further imaging.      -[name] was advised to return sooner than planned today if new concerns arise.       Delailah Spieth G. SwazilandJordan, MD  [facility]. [place] office.

## 2017-06-18 ENCOUNTER — Telehealth: Payer: Self-pay | Admitting: Family Medicine

## 2017-06-18 ENCOUNTER — Ambulatory Visit (INDEPENDENT_AMBULATORY_CARE_PROVIDER_SITE_OTHER): Payer: 59 | Admitting: Family Medicine

## 2017-06-18 ENCOUNTER — Encounter: Payer: Self-pay | Admitting: *Deleted

## 2017-06-18 ENCOUNTER — Encounter: Payer: Self-pay | Admitting: Family Medicine

## 2017-06-18 VITALS — BP 130/86 | HR 82 | Temp 98.4°F | Resp 12 | Ht 71.0 in | Wt 203.1 lb

## 2017-06-18 DIAGNOSIS — M545 Low back pain: Secondary | ICD-10-CM | POA: Diagnosis not present

## 2017-06-18 DIAGNOSIS — R911 Solitary pulmonary nodule: Secondary | ICD-10-CM

## 2017-06-18 DIAGNOSIS — G8929 Other chronic pain: Secondary | ICD-10-CM

## 2017-06-18 DIAGNOSIS — F909 Attention-deficit hyperactivity disorder, unspecified type: Secondary | ICD-10-CM

## 2017-06-18 DIAGNOSIS — E785 Hyperlipidemia, unspecified: Secondary | ICD-10-CM

## 2017-06-18 DIAGNOSIS — I10 Essential (primary) hypertension: Secondary | ICD-10-CM | POA: Diagnosis not present

## 2017-06-18 DIAGNOSIS — N281 Cyst of kidney, acquired: Secondary | ICD-10-CM | POA: Diagnosis not present

## 2017-06-18 DIAGNOSIS — M549 Dorsalgia, unspecified: Secondary | ICD-10-CM | POA: Insufficient documentation

## 2017-06-18 DIAGNOSIS — R69 Illness, unspecified: Secondary | ICD-10-CM | POA: Diagnosis not present

## 2017-06-18 DIAGNOSIS — N200 Calculus of kidney: Secondary | ICD-10-CM | POA: Diagnosis not present

## 2017-06-18 MED ORDER — LEVOTHYROXINE SODIUM 50 MCG PO TABS: ORAL_TABLET | ORAL | 2 refills | Status: DC

## 2017-06-18 MED ORDER — ADDERALL XR 20 MG PO CP24: 20.0000 mg | ORAL_CAPSULE | Freq: Every day | ORAL | 0 refills | Status: DC

## 2017-06-18 NOTE — Assessment & Plan Note (Signed)
Adequately controlled. No changes in current management. Low salt diet to continue. Eye exam recommended annually. F/U in 6 months, before if needed.  

## 2017-06-18 NOTE — Assessment & Plan Note (Signed)
He is not fasting today, so he will be back next week for fasting labs. No changes in current management. Continue low-fat diet. Follow-up in 6-12 months.

## 2017-06-18 NOTE — Assessment & Plan Note (Signed)
Chronic, referral to orthopedist was placed as requested. PT exercises recommended, he is familiar with "stretching" exercises that have helped in the past.

## 2017-06-18 NOTE — Assessment & Plan Note (Signed)
Stable on Adderall XR 20 mg Aware of side effects of medication. No changes in current management Med contract signed today. F/U in 6 months.

## 2017-06-18 NOTE — Telephone Encounter (Signed)
Prescription for Adderall sent x 3. Please remind patient that he is not supposed to have this medication refill by a different provider,given the fact this is a controlled medication.  Thanks, BJ

## 2017-06-18 NOTE — Patient Instructions (Addendum)
A few things to remember from today's visit:   Attention deficit hyperactivity disorder (ADHD), unspecified ADHD type  Nodule of middle lobe of right lung  Nephrolithiasis - Plan: Ambulatory referral to Urology  Renal cyst - Plan: Ambulatory referral to Urology  Hyperlipidemia, unspecified hyperlipidemia type - Plan: Comprehensive metabolic panel, Lipid panel  Chronic left-sided low back pain, with sciatica presence unspecified - Plan: Ambulatory referral to Orthopedic Surgery  No changes today. Ortho and urology referral placed.   Please be sure medication list is accurate. If a new problem present, please set up appointment sooner than planned today.

## 2017-06-18 NOTE — Telephone Encounter (Signed)
Okay to fill script of Adderall today? Pharmacy called and script had fill in 2 months along with directions, then at bottom of script said earliest fill date is 06/09/2017.

## 2017-06-18 NOTE — Telephone Encounter (Signed)
Pt called and aware of appt

## 2017-06-18 NOTE — Telephone Encounter (Signed)
Left message informing patient that medication was sent to the pharmacy and instructions per Dr. SwazilandJordan concerning controlled medications.

## 2017-06-18 NOTE — Assessment & Plan Note (Signed)
We discussed current recommendations based on size and risk factors. He has no history of tobacco use, so the probability of this being malignant is low.  We can follow with chest CT in 1 year.

## 2017-06-18 NOTE — Assessment & Plan Note (Signed)
Reassured, most likely benign. He will continue following with urologist.

## 2017-06-18 NOTE — Assessment & Plan Note (Signed)
Adequate hydration. Referral to urologist was placed, he will arrange appointment with provider he saw about 7 years ago. Monitor for warning signs.

## 2017-06-21 ENCOUNTER — Other Ambulatory Visit: Payer: Self-pay | Admitting: Family Medicine

## 2017-06-21 ENCOUNTER — Other Ambulatory Visit (INDEPENDENT_AMBULATORY_CARE_PROVIDER_SITE_OTHER): Payer: 59

## 2017-06-21 ENCOUNTER — Telehealth: Payer: Self-pay | Admitting: Family Medicine

## 2017-06-21 DIAGNOSIS — F909 Attention-deficit hyperactivity disorder, unspecified type: Secondary | ICD-10-CM

## 2017-06-21 DIAGNOSIS — I10 Essential (primary) hypertension: Secondary | ICD-10-CM

## 2017-06-21 DIAGNOSIS — E785 Hyperlipidemia, unspecified: Secondary | ICD-10-CM

## 2017-06-21 LAB — COMPREHENSIVE METABOLIC PANEL
ALK PHOS: 72 U/L (ref 39–117)
ALT: 15 U/L (ref 0–53)
AST: 14 U/L (ref 0–37)
Albumin: 3.9 g/dL (ref 3.5–5.2)
BILIRUBIN TOTAL: 0.7 mg/dL (ref 0.2–1.2)
BUN: 19 mg/dL (ref 6–23)
CO2: 29 mEq/L (ref 19–32)
Calcium: 9.1 mg/dL (ref 8.4–10.5)
Chloride: 105 mEq/L (ref 96–112)
Creatinine, Ser: 1.35 mg/dL (ref 0.40–1.50)
GFR: 57.63 mL/min — ABNORMAL LOW (ref >60.00)
Glucose, Bld: 86 mg/dL (ref 70–99)
POTASSIUM: 4.3 meq/L (ref 3.5–5.1)
SODIUM: 140 meq/L (ref 135–145)
TOTAL PROTEIN: 6.1 g/dL (ref 6.0–8.3)

## 2017-06-21 LAB — LIPID PANEL
CHOLESTEROL: 203 mg/dL — AB (ref 0–200)
HDL: 37.5 mg/dL — ABNORMAL LOW (ref >39.00)
LDL Cholesterol: 146 mg/dL — ABNORMAL HIGH (ref 0–99)
NonHDL: 165.97
Total CHOL/HDL Ratio: 5
Triglycerides: 102 mg/dL (ref 0.0–149.0)
VLDL: 20.4 mg/dL (ref 0.0–40.0)

## 2017-06-21 MED ORDER — AMPHETAMINE-DEXTROAMPHET ER 20 MG PO CP24: 20.0000 mg | ORAL_CAPSULE | Freq: Every day | ORAL | 0 refills | Status: DC

## 2017-06-21 NOTE — Telephone Encounter (Signed)
Copied from CRM [id number]. Topic: Quick Communication - See Telephone Encounter >> Jun 21, 2017 11:04 AM Rudi CocoLathan, Malika Demario M, NT wrote: CRM for notification. See Telephone encounter for:   06/21/17. [facility] called to ask  Dr. SwazilandJordan to send in new rx. Due to Pt. Needs refill on generic ADDERALL XR 20 MG 24 hr capsule [[id number]][[id number]]. Dr. Rhina BrackettSent in non generic rx. And pt. Insurance will not cover that. So a new rx. [facility] [place].  [id number] [place] - [address] AT [facility] [address] [place] [id number] Phone: [phone] Fax: [phone]

## 2017-06-21 NOTE — Telephone Encounter (Signed)
I sent 2 new prescriptions for Adderall XL 20 mg.  thanks, BJ

## 2017-06-27 ENCOUNTER — Encounter: Payer: Self-pay | Admitting: Family Medicine

## 2017-06-28 DIAGNOSIS — M5442 Lumbago with sciatica, left side: Secondary | ICD-10-CM | POA: Diagnosis not present

## 2017-06-29 ENCOUNTER — Telehealth: Payer: Self-pay | Admitting: Family Medicine

## 2017-06-29 NOTE — Telephone Encounter (Signed)
Left message to give clinic a call back. 

## 2017-06-29 NOTE — Telephone Encounter (Signed)
Copied from CRM [id number]. Topic: Quick Communication - See Telephone Encounter >> Jun 29, 2017 10:56 AM Rudi CocoLathan, Gabriellia Rempel M, NT wrote: CRM for notification. See Telephone encounter for: 06/29/17. Pt. Calling to request images from CT scan that was done on 06/11/17 pt. Already has the results but would like to have the images sent to him through mail or on mychart. Pt. Can be reached at [phone]

## 2017-07-01 NOTE — Telephone Encounter (Signed)
Spoke with patient and informed him that if he came into the office to fill out a records release form, we would send it to medical records so that images can be sent to him. Patient verbalized understanding.

## 2017-07-01 NOTE — Telephone Encounter (Signed)
Pt called and asked if the address can be provided to him on where to pick up his records so that he doesn't have to come sign a release and have the office of going thru the trouble of waiting for them to arrive just to send them out again to reach the pt, call pt to advise

## 2017-07-01 NOTE — Telephone Encounter (Signed)
I spoke with pt and gave [facility] records [facility]

## 2017-07-07 DIAGNOSIS — M545 Low back pain: Secondary | ICD-10-CM | POA: Diagnosis not present

## 2017-07-07 DIAGNOSIS — M5416 Radiculopathy, lumbar region: Secondary | ICD-10-CM | POA: Diagnosis not present

## 2017-07-15 ENCOUNTER — Encounter: Payer: Self-pay | Admitting: Family Medicine

## 2017-07-18 ENCOUNTER — Other Ambulatory Visit: Payer: Self-pay | Admitting: Family Medicine

## 2017-07-18 DIAGNOSIS — N183 Chronic kidney disease, stage 3 unspecified: Secondary | ICD-10-CM

## 2017-07-18 DIAGNOSIS — I10 Essential (primary) hypertension: Secondary | ICD-10-CM

## 2017-07-18 MED ORDER — BENICAR 40 MG PO TABS: 40.0000 mg | ORAL_TABLET | Freq: Every day | ORAL | 1 refills | Status: DC

## 2017-07-20 DIAGNOSIS — M5416 Radiculopathy, lumbar region: Secondary | ICD-10-CM | POA: Diagnosis not present

## 2017-07-20 DIAGNOSIS — M545 Low back pain: Secondary | ICD-10-CM | POA: Diagnosis not present

## 2017-07-22 DIAGNOSIS — M25662 Stiffness of left knee, not elsewhere classified: Secondary | ICD-10-CM | POA: Diagnosis not present

## 2017-07-22 DIAGNOSIS — M47816 Spondylosis without myelopathy or radiculopathy, lumbar region: Secondary | ICD-10-CM | POA: Diagnosis not present

## 2017-07-22 DIAGNOSIS — M25562 Pain in left knee: Secondary | ICD-10-CM | POA: Diagnosis not present

## 2017-07-22 DIAGNOSIS — M5416 Radiculopathy, lumbar region: Secondary | ICD-10-CM | POA: Diagnosis not present

## 2017-07-22 DIAGNOSIS — Z96652 Presence of left artificial knee joint: Secondary | ICD-10-CM | POA: Diagnosis not present

## 2017-07-26 DIAGNOSIS — M5442 Lumbago with sciatica, left side: Secondary | ICD-10-CM | POA: Diagnosis not present

## 2017-08-02 NOTE — Telephone Encounter (Signed)
Copied from CRM [id number]. Topic: Quick Communication - Rx Refill/Question >> Aug 02, 2017  1:29 PM Jonette Eva wrote: Medication: BENICAR 40 MG tablet [[id number]]   Has the patient contacted their pharmacy? Yes.   (Agent: If no, request that the patient contact the pharmacy for the refill.) Preferred Pharmacy (with phone number or street name): [facility] in [place]  Agent: Please be advised that RX refills may take up to 3 business days. We ask that you follow-up with your pharmacy.

## 2017-08-04 ENCOUNTER — Other Ambulatory Visit: Payer: Self-pay | Admitting: Neurosurgery

## 2017-08-04 DIAGNOSIS — M47816 Spondylosis without myelopathy or radiculopathy, lumbar region: Secondary | ICD-10-CM

## 2017-08-05 ENCOUNTER — Telehealth: Payer: Self-pay | Admitting: Family Medicine

## 2017-08-05 NOTE — Telephone Encounter (Signed)
Patient called back stating he got a letter from [facility] saying that they no longer carry Benicar, so this one script needs to be changed to [facility] in [place].  [id number] - [place], [place] - [address] AT [facility] [address]  [address] [place] [address]  Phone: [phone] Fax: [phone]

## 2017-08-05 NOTE — Telephone Encounter (Signed)
Copied from CRM [id number]. Topic: Quick Communication - Rx Refill/Question >> Aug 05, 2017 11:19 AM [name] wrote:  Pt need this med called in today to [facility] 40 MG tablet

## 2017-08-05 NOTE — Telephone Encounter (Signed)
Patient has [facility] listed  And that is where the Rx went- call to patient to inquire if he needs the whole Rx changed over or just a portion. Left message to call and let us know.

## 2017-08-06 ENCOUNTER — Emergency Department (HOSPITAL_BASED_OUTPATIENT_CLINIC_OR_DEPARTMENT_OTHER): Payer: 59

## 2017-08-06 ENCOUNTER — Other Ambulatory Visit: Payer: Self-pay | Admitting: *Deleted

## 2017-08-06 ENCOUNTER — Emergency Department (HOSPITAL_BASED_OUTPATIENT_CLINIC_OR_DEPARTMENT_OTHER)
Admission: EM | Admit: 2017-08-06 | Discharge: 2017-08-06 | Disposition: A | Discharge status: Home / Self Care | Payer: 59 | Attending: Emergency Medicine | Admitting: Emergency Medicine

## 2017-08-06 ENCOUNTER — Other Ambulatory Visit: Payer: Self-pay

## 2017-08-06 ENCOUNTER — Encounter (HOSPITAL_BASED_OUTPATIENT_CLINIC_OR_DEPARTMENT_OTHER): Payer: Self-pay | Admitting: *Deleted

## 2017-08-06 DIAGNOSIS — E039 Hypothyroidism, unspecified: Secondary | ICD-10-CM | POA: Diagnosis not present

## 2017-08-06 DIAGNOSIS — Z79899 Other long term (current) drug therapy: Secondary | ICD-10-CM | POA: Diagnosis not present

## 2017-08-06 DIAGNOSIS — R110 Nausea: Secondary | ICD-10-CM | POA: Diagnosis not present

## 2017-08-06 DIAGNOSIS — N200 Calculus of kidney: Secondary | ICD-10-CM | POA: Insufficient documentation

## 2017-08-06 DIAGNOSIS — I129 Hypertensive chronic kidney disease with stage 1 through stage 4 chronic kidney disease, or unspecified chronic kidney disease: Secondary | ICD-10-CM | POA: Insufficient documentation

## 2017-08-06 DIAGNOSIS — N183 Chronic kidney disease, stage 3 (moderate): Secondary | ICD-10-CM | POA: Insufficient documentation

## 2017-08-06 DIAGNOSIS — Z7982 Long term (current) use of aspirin: Secondary | ICD-10-CM | POA: Insufficient documentation

## 2017-08-06 DIAGNOSIS — N23 Unspecified renal colic: Secondary | ICD-10-CM

## 2017-08-06 DIAGNOSIS — R109 Unspecified abdominal pain: Secondary | ICD-10-CM | POA: Diagnosis not present

## 2017-08-06 DIAGNOSIS — N201 Calculus of ureter: Secondary | ICD-10-CM | POA: Diagnosis not present

## 2017-08-06 DIAGNOSIS — R1011 Right upper quadrant pain: Secondary | ICD-10-CM | POA: Diagnosis present

## 2017-08-06 LAB — BASIC METABOLIC PANEL
ANION GAP: 12 (ref 5–15)
BUN: 22 mg/dL — ABNORMAL HIGH (ref 6–20)
CO2: 19 mmol/L — AB (ref 22–32)
Calcium: 9.9 mg/dL (ref 8.9–10.3)
Chloride: 111 mmol/L (ref 101–111)
Creatinine, Ser: 1.72 mg/dL — ABNORMAL HIGH (ref 0.61–1.24)
GFR calc Af Amer: 49 mL/min — ABNORMAL LOW (ref >60)
GFR calc non Af Amer: 42 mL/min — ABNORMAL LOW (ref >60)
GLUCOSE: 108 mg/dL — AB (ref 65–99)
POTASSIUM: 3.4 mmol/L — AB (ref 3.5–5.1)
Sodium: 142 mmol/L (ref 135–145)

## 2017-08-06 LAB — CBC
HEMATOCRIT: 45.1 % (ref 39.0–52.0)
HEMOGLOBIN: 16.2 g/dL (ref 13.0–17.0)
MCH: 30.6 pg (ref 26.0–34.0)
MCHC: 35.9 g/dL (ref 30.0–36.0)
MCV: 85.1 fL (ref 78.0–100.0)
Platelets: 271 10*3/uL (ref 150–400)
RBC: 5.3 MIL/uL (ref 4.22–5.81)
RDW: 13.4 % (ref 11.5–15.5)
WBC: 7.5 10*3/uL (ref 4.0–10.5)

## 2017-08-06 MED ORDER — HYDROCODONE-ACETAMINOPHEN 5-325 MG PO TABS: 1.0000 | ORAL_TABLET | Freq: Four times a day (QID) | ORAL | 0 refills | Status: DC | PRN

## 2017-08-06 MED ORDER — ONDANSETRON HCL 4 MG/2ML IJ SOLN
4.0000 mg | Freq: Once | INTRAMUSCULAR | Status: AC
  Administered 2017-08-06: 4 mg via INTRAVENOUS
  Filled 2017-08-06: qty 2

## 2017-08-06 MED ORDER — BENICAR 40 MG PO TABS: 40.0000 mg | ORAL_TABLET | Freq: Every day | ORAL | 3 refills | Status: DC

## 2017-08-06 MED ORDER — SODIUM CHLORIDE 0.9 % IV SOLN: INTRAVENOUS | Status: DC

## 2017-08-06 MED ORDER — ONDANSETRON 4 MG PO TBDP: 4.0000 mg | ORAL_TABLET | Freq: Three times a day (TID) | ORAL | 1 refills | Status: DC | PRN

## 2017-08-06 MED ORDER — SODIUM CHLORIDE 0.9 % IV BOLUS
1000.0000 mL | Freq: Once | INTRAVENOUS | Status: AC
  Administered 2017-08-06: 1000 mL via INTRAVENOUS

## 2017-08-06 MED ORDER — HYDROMORPHONE HCL 1 MG/ML IJ SOLN
1.0000 mg | Freq: Once | INTRAMUSCULAR | Status: AC
  Administered 2017-08-06: 1 mg via INTRAVENOUS
  Filled 2017-08-06: qty 1

## 2017-08-06 MED ORDER — KETOROLAC TROMETHAMINE 30 MG/ML IJ SOLN
30.0000 mg | Freq: Once | INTRAMUSCULAR | Status: AC
  Administered 2017-08-06: 30 mg via INTRAVENOUS
  Filled 2017-08-06: qty 1

## 2017-08-06 NOTE — Telephone Encounter (Signed)
Pt needs to have med sent to [facility]. Please call if question - pt needs to have this done before weekend.

## 2017-08-06 NOTE — ED Notes (Signed)
ED Provider at bedside. 

## 2017-08-06 NOTE — ED Notes (Signed)
Patient transported to CT 

## 2017-08-06 NOTE — Telephone Encounter (Signed)
Rx sent to pharmacy   

## 2017-08-06 NOTE — ED Provider Notes (Signed)
[facility] EMERGENCY DEPARTMENT Provider Note   CSN: [id number] Arrival date & time: 08/06/17  1620     History   Chief Complaint Chief Complaint  Patient presents with  . Flank Pain    HPI [name] is a 57 y.o. male.  Patient with past history of kidney stones probably about 18 years ago.  40 minutes prior to arrival had acute onset of right flank pain with pain at the tip of the penis as well.  Reminds him of past kidney stones.  Associated with nausea but no vomiting.  Patient in March had a CT scan done for other reasons that showed 2 kidney stones up in the right kidney but not in the ureter.  She was not having any symptoms consistent with a kidney stone at that time.     Past Medical History:  Diagnosis Date  . ADHD   . Chronic kidney disease   . GERD (gastroesophageal reflux disease)   . Hypertension   . Migraine headache without aura     Patient Active Problem List   Diagnosis Date Noted  . Renal cyst 06/18/2017  . Back pain 06/18/2017  . Nodule of middle lobe of right lung 06/17/2017  . Nephrolithiasis 06/17/2017  . Hypothyroidism (acquired) 12/24/2016  . CKD (chronic kidney disease), stage III (HCC) 01/03/2016  . Hyperlipidemia 01/03/2016  . Vitamin D deficiency 01/03/2016  . Essential hypertension 09/03/2015  . Headache, migraine 09/03/2015  . Attention deficit hyperactivity disorder (ADHD) 09/03/2015    Past Surgical History:  Procedure Laterality Date  . HERNIA REPAIR  2010   Bilateral inguinal        Home Medications    Prior to Admission medications   Medication Sig Start Date End Date Taking? Authorizing Provider  aspirin 81 MG chewable tablet Chew 81 mg by mouth daily.   Yes [provider]  Omega-3 Fatty Acids (FISH OIL) 1000 MG CAPS Take by mouth.   Yes [provider]  amphetamine-dextroamphetamine (ADDERALL XR) 20 MG 24 hr capsule Take 1 capsule (20 mg total) by mouth daily. 06/21/17   [place],  [name] G, MD  BENICAR 40 MG tablet Take 1 tablet (40 mg total) by mouth daily. 08/06/17   [place], [name] G, MD  HYDROcodone-acetaminophen (NORCO/VICODIN) 5-325 MG tablet Take 1-2 tablets by mouth every 6 (six) hours as needed for moderate pain. 08/06/17   Vanetta Mulders, MD  levothyroxine (SYNTHROID) 50 MCG tablet Take 1 tablet on Monday, Thursday, Friday, Saturday and Sunday and take 1.5 tab on Tues and Wednesday. 06/18/17   [place], [name] G, MD  ondansetron (ZOFRAN ODT) 4 MG disintegrating tablet Take 1 tablet (4 mg total) by mouth every 8 (eight) hours as needed. 08/06/17   Vanetta Mulders, MD  simvastatin (ZOCOR) 20 MG tablet Take 1 tablet (20 mg total) by mouth daily. 07/29/16   [place], [name] G, MD    Family History Family History  Problem Relation Age of Onset  . Mental illness Mother   . Cancer Paternal Aunt        lung  . Cancer Paternal Grandmother        lung  . Diabetes Neg Hx     Social History Social History   Tobacco Use  . Smoking status: Never Smoker  . Smokeless tobacco: Never Used  Substance Use Topics  . Alcohol use: Yes    Alcohol/week: 0.0 oz    Comment: occasionally  . Drug use: No     Allergies  Sulfa antibiotics; Benazepril hcl; and Penicillins   Review of Systems Review of Systems  Constitutional: Negative for fever.  HENT: Negative for congestion.   Eyes: Negative for redness.  Respiratory: Negative for shortness of breath.   Cardiovascular: Negative for chest pain.  Gastrointestinal: Positive for nausea. Negative for abdominal pain.  Genitourinary: Positive for flank pain and penile pain. Negative for dysuria.  Musculoskeletal: Positive for back pain.  Skin: Negative for rash.  Neurological: Negative for headaches.  Hematological: Does not bruise/bleed easily.  Psychiatric/Behavioral: Negative for confusion.     Physical Exam Updated Vital Signs BP (!) 128/94   Pulse 98   Temp 97.7 F (36.5 C) (Oral)   Resp (!) 28   Ht 1.803 m ( )    Wt 92.1 kg (203 lb)   SpO2 100%   BMI 28.31 kg/m   Physical Exam  Constitutional: He is oriented to person, place, and time. He appears well-developed and well-nourished. He appears distressed.  HENT:  Head: Normocephalic and atraumatic.  Mouth/Throat: Oropharynx is clear and moist.  Eyes: Pupils are equal, round, and reactive to light. Conjunctivae and EOM are normal.  Neck: Neck supple.  Cardiovascular: Normal rate, regular rhythm and normal heart sounds.  Pulmonary/Chest: Effort normal and breath sounds normal.  Abdominal: Soft. Bowel sounds are normal. There is no tenderness.  Musculoskeletal: Normal range of motion.  Neurological: He is alert and oriented to person, place, and time. No cranial nerve deficit or sensory deficit. He exhibits normal muscle tone. Coordination normal.  Skin: Skin is warm. No rash noted.  Nursing note and vitals reviewed.    ED Treatments / Results  Labs (all labs ordered are listed, but only abnormal results are displayed) Labs Reviewed  BASIC METABOLIC PANEL - Abnormal; Notable for the following components:      Result Value   Potassium 3.4 (*)    CO2 19 (*)    Glucose, Bld 108 (*)    BUN 22 (*)    Creatinine, Ser 1.72 (*)    GFR calc non Af Amer 42 (*)    GFR calc Af Amer 49 (*)    All other components within normal limits  CBC  URINALYSIS, ROUTINE W REFLEX MICROSCOPIC    EKG None  Radiology Ct Renal Stone Study  Result Date: 08/06/2017 CLINICAL DATA:  Flank pain, history of kidney stones EXAM: CT ABDOMEN AND PELVIS WITHOUT CONTRAST TECHNIQUE: Multidetector CT imaging of the abdomen and pelvis was performed following the standard protocol without IV contrast. COMPARISON:  06/11/2017 FINDINGS: Lower chest: Lung bases are clear. Hepatobiliary: 1.6 cm cyst in segment 2 (series 6/image 14). Gallbladder is unremarkable. No intrahepatic or extrahepatic ductal dilatation. Pancreas: Within normal limits. Spleen: Within normal limits.  Adrenals/Urinary Tract: Adrenal glands within normal limits. Right renal cysts, measuring up to 2.3 cm in the medial right lower kidney and 2.5 cm in the medial right upper kidney. Small left renal cysts measuring up to 12 mm in the left lower pole. 3 mm distal right ureteral calculus at the UVJ (series 6/image 67). No hydronephrosis. Stomach/Bowel: Stomach is within normal limits. No evidence of bowel obstruction. Normal appendix (series 6/image 46). Sigmoid diverticulosis, without evidence of diverticulitis. Vascular/Lymphatic: No evidence of abdominal aortic aneurysm. Atherosclerotic calcifications of the abdominal aorta and branch vessels. No suspicious abdominopelvic lymphadenopathy. Reproductive: Prostate is unremarkable. Other: No abdominopelvic ascites. Moderate fat containing left inguinal hernia. Musculoskeletal: Moderate degenerative changes at L1-2. IMPRESSION: 3 mm distal right ureteral calculus at the UVJ.  No hydronephrosis. Moderate fat containing left inguinal hernia. Bilateral renal cysts, measuring up to 2.5 cm in the right upper kidney. Additional ancillary findings as above. Electronically Signed   By: Charline Bills M.D.   On: 08/06/2017 17:55    Procedures Procedures (including critical care time)  Medications Ordered in ED Medications  0.9 %  sodium chloride infusion (has no administration in time range)  ketorolac (TORADOL) 30 MG/ML injection 30 mg (has no administration in time range)  sodium chloride 0.9 % bolus 1,000 mL (0 mLs Intravenous Stopped 08/06/17 1758)  ondansetron (ZOFRAN) injection 4 mg (4 mg Intravenous Given 08/06/17 1702)  HYDROmorphone (DILAUDID) injection 1 mg (1 mg Intravenous Given 08/06/17 1702)     Initial Impression / Assessment and Plan / ED Course  I have reviewed the triage vital signs and the nursing notes.  Pertinent labs & imaging results that were available during my care of the patient were reviewed by me and considered in my medical decision  making (see chart for details).     CT scan consistent with a 3 mm right distal ureteral stone.  Explains patient's symptoms.  Kidney function with some elevation in creatinine compared to old readings.  Patient nontoxic no acute distress.  Improved with pain medicine here.  Will be discharged home with Zofran hydrocodone and patient is able to take nonsteroidals like Motrin or Aleve so he will also take 1 of those to help with the pain.  Referral to urology to call on Monday to set up a follow-up appointment to have kidney function rechecked.  Suspect he will be able to pass the stone sometime in the next 24 hours.  Patient currently does not have a urologist locally.  Final Clinical Impressions(s) / ED Diagnoses   Final diagnoses:  Ureteral colic  Kidney stone  Right flank pain    ED Discharge Orders        Ordered    ondansetron (ZOFRAN ODT) 4 MG disintegrating tablet  Every 8 hours PRN     08/06/17 1831    HYDROcodone-acetaminophen (NORCO/VICODIN) 5-325 MG tablet  Every 6 hours PRN     08/06/17 1831       Vanetta Mulders, MD 08/06/17 1837

## 2017-08-06 NOTE — Discharge Instructions (Signed)
Take the Zofran as needed for nausea.  Take the hydrocodone for pain.  Also can consider Naprosyn, Aleve, Motrin or ibuprofen or Advil for the pain as well.  Call urology for follow-up.  Return for any new or worse symptoms.  CT scan shows evidence of a distal right ureteral kidney stone.

## 2017-08-06 NOTE — ED Triage Notes (Signed)
Right flank and penis pain. Unable to sit still at triage. States he has 2 kidney stones in his right kidney.

## 2017-08-09 NOTE — Progress Notes (Signed)
[name] is a 57 y.o.male, who is here today to follow on HTN.  He is concerned because his BP has been elevated for the past few months but has been worse for the past 4 to 6 weeks.   Currently he is on Benicar 40 mg daily, dose was increased about 3-4 weeks.  He is having a lot of problems with having his insurance feeling the Benicar, brand name.  Apparently the pharmacy send a PA that is in process at this time.  He is still following low-salt diet. No OTC cold medications or herbal treatments. No unusual stress.  He is taking medications as instructed, no side effects reported.  He has not note unusual headache, visual changes, exertional chest pain, dyspnea,  focal weakness, or edema.  History of ADHD, he discontinued Adderall about 3 weeks ago but BPs did not improve.   A few times when he checks BP monitor reports irregular heart rate. She has not felt palpitations or dizziness. One time he had headache and tinnitus when his BP was elevated. Another day he woke up with dry blood in one of his nostrils.  CKD III: He has not noted decreased urine output or forming the urine. Recent BMP done in the ER was mildly worse than his baseline + hypokalemia.  Lab Results  Component Value Date   CREATININE 1.72 (H) 08/06/2017   BUN 22 (H) 08/06/2017   NA 142 08/06/2017   K 3.4 (L) 08/06/2017   CL 111 08/06/2017   CO2 19 (L) 08/06/2017     Since his last OV he has been in the ER with ureteral colic. He has also followed with neurosurgeon, Dr. Channing Mutters, for lumbar radiculopathy.  History of hypothyroidism: Currently he is on levothyroxine 50 mcg daily. Last TSH was in normal range at 1.26 on 06/10/2017. He has not noted,/heat intolerance, changes in bowel habits, tremor, or abnormal weight loss.   Review of Systems  Constitutional: Negative for activity change, appetite change, fatigue and fever.  HENT: Positive for nosebleeds. Negative for mouth sores,  postnasal drip, sore throat and trouble swallowing.   Eyes: Negative for redness and visual disturbance.  Respiratory: Negative for apnea, cough, shortness of breath and wheezing.   Cardiovascular: Negative for chest pain, palpitations and leg swelling.  Gastrointestinal: Negative for abdominal pain, nausea and vomiting.  Endocrine: Negative for cold intolerance and heat intolerance.  Genitourinary: Negative for decreased urine volume and hematuria.  Musculoskeletal: Negative for gait problem and myalgias.  Skin: Negative for pallor and rash.  Neurological: Negative for dizziness, syncope, facial asymmetry and weakness.  Hematological: Negative for adenopathy. Does not bruise/bleed easily.  Psychiatric/Behavioral: Negative for confusion. The patient is nervous/anxious.      Current Outpatient Medications on File Prior to Visit  Medication Sig Dispense Refill  . Ascorbic Acid (VITAMIN C) 1000 MG tablet Take by mouth.    Marland Kitchen aspirin 81 MG chewable tablet Chew 81 mg by mouth daily.    . Garlic 100 MG TABS Take by mouth.    Marland Kitchen HYDROcodone-acetaminophen (NORCO/VICODIN) 5-325 MG tablet Take 1-2 tablets by mouth every 6 (six) hours as needed for moderate pain. 14 tablet 0  . levothyroxine (SYNTHROID) 50 MCG tablet Take 1 tablet on Monday, Thursday, Friday, Saturday and Sunday and take 1.5 tab on Tues and Wednesday. 96 tablet 2  . Multiple Vitamin (MULTI-VITAMINS) TABS Take by mouth.    . Omega-3 1000 MG CAPS Take by mouth.    Marland Kitchen  ondansetron (ZOFRAN ODT) 4 MG disintegrating tablet Take 1 tablet (4 mg total) by mouth every 8 (eight) hours as needed. 10 tablet 1  . simvastatin (ZOCOR) 20 MG tablet Take 1 tablet (20 mg total) by mouth daily. 90 tablet 1  . Vitamin D, Ergocalciferol, (DRISDOL) 50000 units CAPS capsule Take by mouth.     No current facility-administered medications on file prior to visit.      Past Medical History:  Diagnosis Date  . ADHD   . Chronic kidney disease   . GERD  (gastroesophageal reflux disease)   . Hypertension   . Migraine headache without aura     Allergies  Allergen Reactions  . Sulfa Antibiotics Other (See Comments) and Diarrhea    diarrhea  . Benazepril Hcl Swelling    Throat "closing up"  . Penicillins Other (See Comments)    Unknown    Social History   Socioeconomic History  . Marital status: Married    Spouse name: Not on file  . Number of children: Not on file  . Years of education: Not on file  . Highest education level: Not on file  Occupational History  . Not on file  Social Needs  . Financial resource strain: Not on file  . Food insecurity:    Worry: Not on file    Inability: Not on file  . Transportation needs:    Medical: Not on file    Non-medical: Not on file  Tobacco Use  . Smoking status: Never Smoker  . Smokeless tobacco: Never Used  Substance and Sexual Activity  . Alcohol use: Yes    Alcohol/week: 0.0 oz    Comment: occasionally  . Drug use: No  . Sexual activity: Yes  Lifestyle  . Physical activity:    Days per week: Not on file    Minutes per session: Not on file  . Stress: Not on file  Relationships  . Social connections:    Talks on phone: Not on file    Gets together: Not on file    Attends religious service: Not on file    Active member of club or organization: Not on file    Attends meetings of clubs or organizations: Not on file    Relationship status: Not on file  Other Topics Concern  . Not on file  Social History Narrative  . Not on file    Vitals:   08/10/17 0734  BP: (!) 150/100  Pulse: 78  Resp: 12  Temp: 97.7 F (36.5 C)  SpO2: 98%   Body mass index is 28.91 kg/m.  Physical Exam  Nursing note and vitals reviewed. Constitutional: He is oriented to person, place, and time. He appears well-developed. No distress.  HENT:  Head: Normocephalic and atraumatic.  Mouth/Throat: Oropharynx is clear and moist and mucous membranes are normal.  Eyes: Pupils are equal,  round, and reactive to light. Conjunctivae and EOM are normal.  Cardiovascular: Normal rate and regular rhythm.  No murmur heard. Pulses:      Dorsalis pedis pulses are 2+ on the right side, and 2+ on the left side.  My count 68/min  Respiratory: Effort normal and breath sounds normal. No respiratory distress.  GI: Soft. He exhibits no mass. There is no hepatomegaly. There is no tenderness.  Musculoskeletal: He exhibits no edema.  Lymphadenopathy:    He has no cervical adenopathy.  Neurological: He is alert and oriented to person, place, and time. He has normal strength. No cranial nerve deficit.  Gait normal.  Skin: Skin is warm. No rash noted. No erythema.  Psychiatric: He has a normal mood and affect. Cognition and memory are normal.  Well groomed, good eye contact.    ASSESSMENT AND PLAN:   Mr. Green was seen today for hypertension.  Diagnoses and all orders for this visit:  Lab Results  Component Value Date   CREATININE 1.12 08/10/2017   BUN 15 08/10/2017   NA 140 08/10/2017   K 4.2 08/10/2017   CL 105 08/10/2017   CO2 28 08/10/2017   Lab Results  Component Value Date   TSH 6.64 (H) 08/10/2017    Essential hypertension  Not well controlled. Possible complications of elevated BP discussed. Because he is having social problems with insurance covering his Benicar, he agrees with changing to another ARB, Avapro 300 mg daily. I also recommend adding Amlodipine 2.5 mg at night. Continue monitoring BP.  EKG today: Mild sinus bradycardia,LAD,? IVCD,early repolarization changes, and unsp T wave abn inf leads. No other EKG available for comparison.  He will monitor for worrisome symptoms. I will see him back in 2 weeks, before if needed.  -     Basic metabolic panel -     TSH -     irbesartan (AVAPRO) 300 MG tablet; Take 1 tablet (300 mg total) by mouth daily. -     amLODipine (NORVASC) 5 MG tablet; Take 0.5 tablets (2.5 mg total) by mouth daily. -     EKG  12-Lead  Hypokalemia  Mild. Further recommendations will be given according to lab results.  -     Basic metabolic panel  CKD (chronic kidney disease), stage III (HCC)  Continue low-salt diet. Avoid NSAIDs. Adequate hydration. He will start Avapro. We will repeat renal function today.  Hypothyroidism (acquired)  No changes in current management, levothyroxine will be adjusted depending of TSH results.      [name] G. [place], MD  [facility] [place] office.

## 2017-08-10 ENCOUNTER — Encounter: Payer: Self-pay | Admitting: Family Medicine

## 2017-08-10 ENCOUNTER — Ambulatory Visit: Payer: 59

## 2017-08-10 ENCOUNTER — Ambulatory Visit
Admission: RE | Admit: 2017-08-10 | Discharge: 2017-08-10 | Disposition: A | Discharge status: Home / Self Care | Payer: 59 | Source: Ambulatory Visit | Attending: Neurosurgery | Admitting: Neurosurgery

## 2017-08-10 ENCOUNTER — Ambulatory Visit: Payer: 59 | Admitting: Family Medicine

## 2017-08-10 VITALS — BP 150/100 | HR 78 | Temp 97.7°F | Resp 12 | Ht 71.0 in | Wt 207.2 lb

## 2017-08-10 DIAGNOSIS — M47816 Spondylosis without myelopathy or radiculopathy, lumbar region: Secondary | ICD-10-CM

## 2017-08-10 DIAGNOSIS — E039 Hypothyroidism, unspecified: Secondary | ICD-10-CM

## 2017-08-10 DIAGNOSIS — E876 Hypokalemia: Secondary | ICD-10-CM

## 2017-08-10 DIAGNOSIS — N183 Chronic kidney disease, stage 3 unspecified: Secondary | ICD-10-CM

## 2017-08-10 DIAGNOSIS — M5134 Other intervertebral disc degeneration, thoracic region: Secondary | ICD-10-CM | POA: Diagnosis not present

## 2017-08-10 DIAGNOSIS — I10 Essential (primary) hypertension: Secondary | ICD-10-CM

## 2017-08-10 DIAGNOSIS — M4184 Other forms of scoliosis, thoracic region: Secondary | ICD-10-CM | POA: Diagnosis not present

## 2017-08-10 DIAGNOSIS — M48061 Spinal stenosis, lumbar region without neurogenic claudication: Secondary | ICD-10-CM | POA: Diagnosis not present

## 2017-08-10 DIAGNOSIS — M5136 Other intervertebral disc degeneration, lumbar region: Secondary | ICD-10-CM | POA: Diagnosis not present

## 2017-08-10 LAB — TSH: TSH: 6.64 u[IU]/mL — AB (ref 0.35–4.50)

## 2017-08-10 LAB — BASIC METABOLIC PANEL
BUN: 15 mg/dL (ref 6–23)
CHLORIDE: 105 meq/L (ref 96–112)
CO2: 28 mEq/L (ref 19–32)
Calcium: 8.8 mg/dL (ref 8.4–10.5)
Creatinine, Ser: 1.12 mg/dL (ref 0.40–1.50)
GFR: 71.46 mL/min (ref >60.00)
GLUCOSE: 78 mg/dL (ref 70–99)
POTASSIUM: 4.2 meq/L (ref 3.5–5.1)
Sodium: 140 mEq/L (ref 135–145)

## 2017-08-10 MED ORDER — AMLODIPINE BESYLATE 5 MG PO TABS: 2.5000 mg | ORAL_TABLET | Freq: Every day | ORAL | 3 refills | Status: DC

## 2017-08-10 MED ORDER — IRBESARTAN 300 MG PO TABS: 300.0000 mg | ORAL_TABLET | Freq: Every day | ORAL | 1 refills | Status: DC

## 2017-08-10 NOTE — Patient Instructions (Addendum)
A few things to remember from today's visit:   Essential hypertension - Plan: Basic metabolic panel, TSH, irbesartan (AVAPRO) 300 MG tablet, amLODipine (NORVASC) 5 MG tablet, EKG 12-Lead  Hypokalemia - Plan: Basic metabolic panel  CKD (chronic kidney disease), stage III (HCC)  Hypothyroidism (acquired)  Today Amlodipine 2.5 mg added. Benicar discontinued. Avapro 300 mg added.   Please be sure medication list is accurate. If a new problem present, please set up appointment sooner than planned today.

## 2017-08-23 NOTE — Progress Notes (Signed)
HPI:   [name] is a 57 y.o. male, who is here today to follow on recent OV.   He was seen on 08/10/2017, when Benicar 40 mg was changed to irbesartan 300 mg daily and Amlodipine 2.5 mg was added.  Lab Results  Component Value Date   CREATININE 1.12 08/10/2017   BUN 15 08/10/2017   NA 140 08/10/2017   K 4.2 08/10/2017   CL 105 08/10/2017   CO2 28 08/10/2017   Hx of migraines, headaches are coming back. 8/10 More frequent for the past couple months,typical headache. 3 times per week in average. Excedrin migraine. Wakes up with headache around 3 Am.  Eye exam early this year.  No diplopia or blindness.   Chest cramp under left breast,no radiated. No associated with dyspnea or diaphoresis, lasted about 2 hours.   Hypothyroidism:  Currently he is on Levothyroxine 50 mcg daily. Tolerating medication well, no side effects reported. She has not noted dysphagia, palpitations, abdominal pain, changes in bowel habits, tremor, cold/heat intolerance, or abnormal weight loss.  Lab Results  Component Value Date   TSH 6.64 (H) 08/10/2017   Rectal bleed: Last week dripping blood from rectum,on left side of stool. He has had rectal bleed before. He attributes to elevated BP. Exacerbated by straining. Occasional stubbing pain but not always associated with defecation.   Negative for abdominal pain, nausea, vomiting, changes in bowel habits, or melena. Colonoscopy early this year and mild external hemorrhoids, he had them binding before.    Review of Systems  Constitutional: Negative for activity change, appetite change, fatigue and fever.  HENT: Negative for nosebleeds, sore throat and trouble swallowing.   Eyes: Positive for visual disturbance (intermittent blurry vision for the past couple days.). Negative for redness.  Respiratory: Negative for cough, shortness of breath and wheezing.   Cardiovascular: Negative for chest pain, palpitations and leg  swelling.  Gastrointestinal: Positive for anal bleeding. Negative for abdominal pain, nausea and vomiting.       No changes in bowel habits.  Endocrine: Negative for cold intolerance and heat intolerance.  Genitourinary: Negative for decreased urine volume, dysuria and hematuria.  Neurological: Positive for headaches. Negative for syncope and weakness.  Psychiatric/Behavioral: Negative for confusion. The patient is nervous/anxious.       Current Outpatient Medications on File Prior to Visit  Medication Sig Dispense Refill  . Ascorbic Acid (VITAMIN C) 1000 MG tablet Take by mouth.    Marland Kitchen aspirin 81 MG chewable tablet Chew 81 mg by mouth daily.    . Garlic 100 MG TABS Take by mouth.    Marland Kitchen HYDROcodone-acetaminophen (NORCO/VICODIN) 5-325 MG tablet Take 1-2 tablets by mouth every 6 (six) hours as needed for moderate pain. 14 tablet 0  . irbesartan (AVAPRO) 300 MG tablet Take 1 tablet (300 mg total) by mouth daily. 30 tablet 1  . levothyroxine (SYNTHROID) 50 MCG tablet Take 1 tablet on Monday, Thursday, Friday, Saturday and Sunday and take 1.5 tab on Tues and Wednesday. 96 tablet 2  . Multiple Vitamin (MULTI-VITAMINS) TABS Take by mouth.    . Omega-3 1000 MG CAPS Take by mouth.    . ondansetron (ZOFRAN ODT) 4 MG disintegrating tablet Take 1 tablet (4 mg total) by mouth every 8 (eight) hours as needed. 10 tablet 1  . simvastatin (ZOCOR) 20 MG tablet Take 1 tablet (20 mg total) by mouth daily. 90 tablet 1  . Vitamin D, Ergocalciferol, (DRISDOL) 50000 units CAPS capsule Take by  mouth.     No current facility-administered medications on file prior to visit.      Past Medical History:  Diagnosis Date  . ADHD   . Chronic kidney disease   . GERD (gastroesophageal reflux disease)   . Hypertension   . Migraine headache without aura    Allergies  Allergen Reactions  . Sulfa Antibiotics Other (See Comments) and Diarrhea    diarrhea  . Benazepril Hcl Swelling    Throat "closing up"  .  Penicillins Other (See Comments)    Unknown    Social History   Socioeconomic History  . Marital status: Married    Spouse name: Not on file  . Number of children: Not on file  . Years of education: Not on file  . Highest education level: Not on file  Occupational History  . Not on file  Social Needs  . Financial resource strain: Not on file  . Food insecurity:    Worry: Not on file    Inability: Not on file  . Transportation needs:    Medical: Not on file    Non-medical: Not on file  Tobacco Use  . Smoking status: Never Smoker  . Smokeless tobacco: Never Used  Substance and Sexual Activity  . Alcohol use: Yes    Alcohol/week: 0.0 oz    Comment: occasionally  . Drug use: No  . Sexual activity: Yes  Lifestyle  . Physical activity:    Days per week: Not on file    Minutes per session: Not on file  . Stress: Not on file  Relationships  . Social connections:    Talks on phone: Not on file    Gets together: Not on file    Attends religious service: Not on file    Active member of club or organization: Not on file    Attends meetings of clubs or organizations: Not on file    Relationship status: Not on file  Other Topics Concern  . Not on file  Social History Narrative  . Not on file    Vitals:   08/24/17 0729  BP: (!) 150/96  Pulse: 74  Resp: 12  SpO2: 98%   Body mass index is 29.04 kg/m.    Physical Exam  Nursing note and vitals reviewed. Constitutional: He is oriented to person, place, and time. He appears well-developed. No distress.  HENT:  Head: Normocephalic and atraumatic.  Mouth/Throat: Oropharynx is clear and moist and mucous membranes are normal.  Eyes: Pupils are equal, round, and reactive to light. Conjunctivae are normal.  Neck: No tracheal deviation present. No thyroid mass and no thyromegaly present.  Cardiovascular: Normal rate and regular rhythm.  No murmur heard. Pulses:      Dorsalis pedis pulses are 2+ on the right side, and 2+  on the left side.  Respiratory: Effort normal and breath sounds normal. No respiratory distress.  GI: Soft. He exhibits no mass. There is no hepatomegaly. There is no tenderness.  Genitourinary:  Genitourinary Comments: Deferred to GI.  Musculoskeletal: He exhibits no edema.  Lymphadenopathy:    He has no cervical adenopathy.  Neurological: He is alert and oriented to person, place, and time. He has normal strength. No cranial nerve deficit. Gait normal.  Skin: Skin is warm. No rash noted. No erythema.  Psychiatric: His mood appears anxious. Cognition and memory are normal.  Well groomed, good eye contact.    ASSESSMENT AND PLAN:  Mr. Jian was seen today for follow-up.  Diagnoses  and all orders for this visit:   Orders Placed This Encounter  Procedures  . TSH   Lab Results  Component Value Date   TSH 4.29 08/24/2017    Essential hypertension BP still elevated, SBP's have improved. Amlodipine increased from 2.5 mg to 5 mg. No changes in Avapro 300 mg. Will consider adding thiazide or BB. Continue monitoring BP. Instructed about warning signs. F/U in 2 weeks.  Headache, migraine Neurologic exam today normal. Typical headaches. I do not think brain imaging is needed at this time. He will monitor for worrisome symptoms/signs.  Hypothyroidism (acquired) No changes in current management, will follow labs done today and will give further recommendations accordingly.   Rectal bleed Prior Hx of rectal bleed. Colonoscopy early this year,per pt report. Instructed about warning signs. Will follow with his GI if symptoms are persistent or get worse.      [name] G. Sw[place]il[place], MD  [facility]. [place] office.

## 2017-08-24 ENCOUNTER — Encounter: Payer: Self-pay | Admitting: Family Medicine

## 2017-08-24 ENCOUNTER — Ambulatory Visit: Payer: 59 | Admitting: Family Medicine

## 2017-08-24 VITALS — BP 150/96 | HR 74 | Resp 12 | Ht 71.0 in | Wt 208.2 lb

## 2017-08-24 DIAGNOSIS — K625 Hemorrhage of anus and rectum: Secondary | ICD-10-CM

## 2017-08-24 DIAGNOSIS — E039 Hypothyroidism, unspecified: Secondary | ICD-10-CM

## 2017-08-24 DIAGNOSIS — I10 Essential (primary) hypertension: Secondary | ICD-10-CM | POA: Diagnosis not present

## 2017-08-24 DIAGNOSIS — G43009 Migraine without aura, not intractable, without status migrainosus: Secondary | ICD-10-CM

## 2017-08-24 LAB — TSH: TSH: 4.29 u[IU]/mL (ref 0.35–4.50)

## 2017-08-24 MED ORDER — AMLODIPINE BESYLATE 10 MG PO TABS: 10.0000 mg | ORAL_TABLET | Freq: Every day | ORAL | 0 refills | Status: DC

## 2017-08-24 NOTE — Assessment & Plan Note (Signed)
No changes in current management, will follow labs done today and will give further recommendations accordingly.  

## 2017-08-24 NOTE — Patient Instructions (Addendum)
A few things to remember from today's visit:   Essential hypertension  Hypothyroidism (acquired) - Plan: TSH  Migraine without aura and without status migrainosus, not intractable  Amlodipine increased to 5 mg, in a week increase to 10 mg if blood pressure still > 140/90.  No changes in rest.   Please be sure medication list is accurate. If a new problem present, please set up appointment sooner than planned today.

## 2017-08-24 NOTE — Assessment & Plan Note (Addendum)
BP still elevated, SBP's have improved. Amlodipine increased from 2.5 mg to 5 mg. In a week if still > 140/90 he can increase Amlodipine from 5 mg to 10 mg. No changes in Avapro 300 mg. Will consider adding thiazide or BB. Continue monitoring BP. Instructed about warning signs. F/U in 2 weeks.

## 2017-08-24 NOTE — Assessment & Plan Note (Signed)
Prior Hx of rectal bleed. Colonoscopy early this year,per pt report. Instructed about warning signs. Will follow with his GI if symptoms are persistent or get worse.

## 2017-08-24 NOTE — Assessment & Plan Note (Signed)
Neurologic exam today normal. Typical headaches. I do not think brain imaging is needed at this time. He will monitor for worrisome symptoms/signs.

## 2017-08-26 DIAGNOSIS — M48061 Spinal stenosis, lumbar region without neurogenic claudication: Secondary | ICD-10-CM | POA: Diagnosis not present

## 2017-08-26 DIAGNOSIS — M47816 Spondylosis without myelopathy or radiculopathy, lumbar region: Secondary | ICD-10-CM | POA: Diagnosis not present

## 2017-08-31 ENCOUNTER — Other Ambulatory Visit: Payer: Self-pay | Admitting: Family Medicine

## 2017-08-31 DIAGNOSIS — E039 Hypothyroidism, unspecified: Secondary | ICD-10-CM

## 2017-10-05 ENCOUNTER — Other Ambulatory Visit: Payer: Self-pay | Admitting: Family Medicine

## 2017-10-05 DIAGNOSIS — I10 Essential (primary) hypertension: Secondary | ICD-10-CM

## 2017-11-24 ENCOUNTER — Other Ambulatory Visit: Payer: Self-pay | Admitting: Family Medicine

## 2018-02-07 ENCOUNTER — Other Ambulatory Visit: Payer: Self-pay | Admitting: Family Medicine

## 2018-02-07 DIAGNOSIS — I10 Essential (primary) hypertension: Secondary | ICD-10-CM

## 2018-02-08 ENCOUNTER — Other Ambulatory Visit: Payer: Self-pay | Admitting: Family Medicine

## 2018-02-08 DIAGNOSIS — I10 Essential (primary) hypertension: Secondary | ICD-10-CM

## 2018-02-09 ENCOUNTER — Other Ambulatory Visit: Payer: Self-pay | Admitting: Family Medicine

## 2018-02-09 DIAGNOSIS — E039 Hypothyroidism, unspecified: Secondary | ICD-10-CM

## 2018-02-23 ENCOUNTER — Other Ambulatory Visit: Payer: Self-pay | Admitting: Family Medicine

## 2018-03-07 ENCOUNTER — Other Ambulatory Visit: Payer: Self-pay | Admitting: Family Medicine

## 2018-03-07 ENCOUNTER — Other Ambulatory Visit: Payer: Self-pay

## 2018-03-07 DIAGNOSIS — I10 Essential (primary) hypertension: Secondary | ICD-10-CM

## 2018-03-07 MED ORDER — AVAPRO 300 MG PO TABS: 300.0000 mg | ORAL_TABLET | Freq: Every day | ORAL | 0 refills | Status: DC

## 2018-03-09 ENCOUNTER — Encounter: Payer: Self-pay | Admitting: Family Medicine

## 2018-03-09 ENCOUNTER — Ambulatory Visit (INDEPENDENT_AMBULATORY_CARE_PROVIDER_SITE_OTHER): Payer: 59 | Admitting: Family Medicine

## 2018-03-09 VITALS — BP 126/80 | HR 76 | Temp 98.1°F | Resp 12 | Ht 71.0 in | Wt 210.0 lb

## 2018-03-09 DIAGNOSIS — E785 Hyperlipidemia, unspecified: Secondary | ICD-10-CM

## 2018-03-09 DIAGNOSIS — E039 Hypothyroidism, unspecified: Secondary | ICD-10-CM | POA: Diagnosis not present

## 2018-03-09 DIAGNOSIS — I10 Essential (primary) hypertension: Secondary | ICD-10-CM | POA: Diagnosis not present

## 2018-03-09 DIAGNOSIS — J309 Allergic rhinitis, unspecified: Secondary | ICD-10-CM

## 2018-03-09 LAB — BASIC METABOLIC PANEL
BUN: 20 mg/dL (ref 6–23)
CHLORIDE: 108 meq/L (ref 96–112)
CO2: 26 mEq/L (ref 19–32)
Calcium: 9.2 mg/dL (ref 8.4–10.5)
Creatinine, Ser: 1.22 mg/dL (ref 0.40–1.50)
GFR: 64.62 mL/min (ref >60.00)
Glucose, Bld: 91 mg/dL (ref 70–99)
POTASSIUM: 4.3 meq/L (ref 3.5–5.1)
SODIUM: 142 meq/L (ref 135–145)

## 2018-03-09 LAB — LIPID PANEL
CHOLESTEROL: 222 mg/dL — AB (ref 0–200)
HDL: 37.5 mg/dL — ABNORMAL LOW (ref >39.00)
LDL Cholesterol: 164 mg/dL — ABNORMAL HIGH (ref 0–99)
NonHDL: 184.26
TRIGLYCERIDES: 102 mg/dL (ref 0.0–149.0)
Total CHOL/HDL Ratio: 6
VLDL: 20.4 mg/dL (ref 0.0–40.0)

## 2018-03-09 LAB — TSH: TSH: 6.59 u[IU]/mL — AB (ref 0.35–4.50)

## 2018-03-09 MED ORDER — HYDROCHLOROTHIAZIDE 25 MG PO TABS: 25.0000 mg | ORAL_TABLET | Freq: Every day | ORAL | 3 refills | Status: DC

## 2018-03-09 MED ORDER — FLONASE 50 MCG/ACT NA SUSP: 2.0000 | Freq: Every day | NASAL | 3 refills | Status: AC

## 2018-03-09 MED ORDER — AMLODIPINE BESYLATE 10 MG PO TABS: 10.0000 mg | ORAL_TABLET | Freq: Every day | ORAL | 2 refills | Status: DC

## 2018-03-09 MED ORDER — SYNTHROID 50 MCG PO TABS: ORAL_TABLET | ORAL | 2 refills | Status: DC

## 2018-03-09 MED ORDER — AVAPRO 300 MG PO TABS: 300.0000 mg | ORAL_TABLET | Freq: Every day | ORAL | 2 refills | Status: DC

## 2018-03-09 NOTE — Assessment & Plan Note (Signed)
He will continue on nonpharmacologic treatment for now. Further recommendations will be given according to lipid panel results as well as 10 years CVD risk.

## 2018-03-09 NOTE — Patient Instructions (Addendum)
A few things to remember from today's visit:   Essential hypertension - Plan: Basic metabolic panel, AVAPRO 300 MG tablet  Hypothyroidism (acquired) - Plan: TSH  Hyperlipidemia, unspecified hyperlipidemia type - Plan: Lipid panel  Hydrochlorothiazide 25 mg added today. Continue monitoring blood pressure. In about 2 to 3 weeks let me know about blood pressure numbers. Rest of the medications unchanged.  Please be sure medication list is accurate. If a new problem present, please set up appointment sooner than planned today.

## 2018-03-09 NOTE — Assessment & Plan Note (Signed)
No changes in current management, will follow labs done today and will give further recommendations accordingly.  

## 2018-03-09 NOTE — Assessment & Plan Note (Signed)
Re-checked 135/90. HCTZ 25 mg added. No changes in rest of meds. DASH-low salt diet recommended. Eye exam is due. Instructed to let me know what BP readings in about 2 to 3 weeks. F/U in 6 months, before if needed.

## 2018-03-09 NOTE — Progress Notes (Signed)
HPI:   Mr.Cardale Consuelo PandyBernard Prigmore is a 57 y.o. male, who is here today for 6 months follow up.   He was last seen on 08/24/17  Hypertension:  Currently on Avapro 300 mg daily and amlodipine 10 mg daily. Home BPs 140s/90s.  He is taking medications as instructed, no side effects reported. He has not noted unusual headache, visual changes, exertional chest pain, dyspnea,  focal weakness, or edema.   Lab Results  Component Value Date   CREATININE 1.12 08/10/2017   BUN 15 08/10/2017   NA 140 08/10/2017   K 4.2 08/10/2017   CL 105 08/10/2017   CO2 28 08/10/2017     Hyperlipidemia: Currently on nonpharmacologic treatment. Following a low fat diet: Yes.  He discontinued simvastatin 20 mg a few months ago.  Lab Results  Component Value Date   CHOL 203 (H) 06/21/2017   HDL 37.50 (L) 06/21/2017   LDLCALC 146 (H) 06/21/2017   TRIG 102.0 06/21/2017   CHOLHDL 5 06/21/2017    Hypothyroidism: Currently he is on levothyroxine 50 mcg x 5 days and 1.5 tablet x 2 days/week. . Tolerating medication well, no side effects reported. He has not noted dysphagia, palpitations, abdominal pain, changes in bowel habits, tremor, cold/heat intolerance, or abnormal weight loss.  Lab Results  Component Value Date   TSH 4.29 08/24/2017    Today he is complaining about nasal congestion, which she works at night when he lays down. He has been using nasal Afrin, which helps, so he can sleep better. He has not had fever, chills, sore throat.     Review of Systems  Constitutional: Negative for activity change, appetite change, fatigue, fever and unexpected weight change.  HENT: Positive for congestion. Negative for nosebleeds, postnasal drip, sore throat and trouble swallowing.   Eyes: Negative for redness and visual disturbance.  Respiratory: Negative for apnea, cough, shortness of breath and wheezing.   Cardiovascular: Negative for chest pain, palpitations and leg swelling.    Gastrointestinal: Negative for abdominal pain, nausea and vomiting.  Endocrine: Negative for cold intolerance and heat intolerance.  Genitourinary: Negative for decreased urine volume, dysuria and hematuria.  Allergic/Immunologic: Positive for environmental allergies.  Neurological: Negative for syncope, weakness and headaches.  Psychiatric/Behavioral: The patient is nervous/anxious.     Current Outpatient Medications on File Prior to Visit  Medication Sig Dispense Refill  . Ascorbic Acid (VITAMIN C) 1000 MG tablet Take by mouth.    Marland Kitchen. aspirin 81 MG chewable tablet Chew 81 mg by mouth daily.    . Garlic 100 MG TABS Take by mouth.    Marland Kitchen. HYDROcodone-acetaminophen (NORCO/VICODIN) 5-325 MG tablet Take 1-2 tablets by mouth every 6 (six) hours as needed for moderate pain. 14 tablet 0  . Multiple Vitamin (MULTI-VITAMINS) TABS Take by mouth.    . Omega-3 1000 MG CAPS Take by mouth.    . ondansetron (ZOFRAN ODT) 4 MG disintegrating tablet Take 1 tablet (4 mg total) by mouth every 8 (eight) hours as needed. 10 tablet 1  . simvastatin (ZOCOR) 20 MG tablet Take 1 tablet (20 mg total) by mouth daily. 90 tablet 1  . Vitamin D, Ergocalciferol, (DRISDOL) 50000 units CAPS capsule Take by mouth.     No current facility-administered medications on file prior to visit.      Past Medical History:  Diagnosis Date  . ADHD   . Chronic kidney disease   . GERD (gastroesophageal reflux disease)   . Hypertension   . Migraine  headache without aura    Allergies  Allergen Reactions  . Sulfa Antibiotics Other (See Comments) and Diarrhea    diarrhea  . Benazepril Hcl Swelling    Throat "closing up"  . Penicillins Other (See Comments)    Unknown    Social History   Socioeconomic History  . Marital status: Married    Spouse name: Not on file  . Number of children: Not on file  . Years of education: Not on file  . Highest education level: Not on file  Occupational History  . Not on file  Social Needs   . Financial resource strain: Not on file  . Food insecurity:    Worry: Not on file    Inability: Not on file  . Transportation needs:    Medical: Not on file    Non-medical: Not on file  Tobacco Use  . Smoking status: Never Smoker  . Smokeless tobacco: Never Used  Substance and Sexual Activity  . Alcohol use: Yes    Alcohol/week: 0.0 standard drinks    Comment: occasionally  . Drug use: No  . Sexual activity: Yes  Lifestyle  . Physical activity:    Days per week: Not on file    Minutes per session: Not on file  . Stress: Not on file  Relationships  . Social connections:    Talks on phone: Not on file    Gets together: Not on file    Attends religious service: Not on file    Active member of club or organization: Not on file    Attends meetings of clubs or organizations: Not on file    Relationship status: Not on file  Other Topics Concern  . Not on file  Social History Narrative  . Not on file    Vitals:   03/09/18 0703  BP: 126/80  Pulse: 76  Resp: 12  Temp: 98.1 F (36.7 C)  SpO2: 98%   Body mass index is 29.29 kg/m.   Physical Exam  Nursing note reviewed. Constitutional: He is oriented to person, place, and time. He appears well-developed. No distress.  HENT:  Head: Normocephalic and atraumatic.  Mouth/Throat: Oropharynx is clear and moist and mucous membranes are normal.  Eyes: Pupils are equal, round, and reactive to light. Conjunctivae are normal.  Cardiovascular: Normal rate and regular rhythm.  Murmur (soft SEM RUSB) heard. Pulses:      Dorsalis pedis pulses are 2+ on the right side, and 2+ on the left side.  Respiratory: Effort normal and breath sounds normal. No respiratory distress.  GI: Soft. He exhibits no mass. There is no hepatomegaly. There is no tenderness.  Musculoskeletal: He exhibits no edema.  Lymphadenopathy:    He has no cervical adenopathy.  Neurological: He is alert and oriented to person, place, and time. He has normal  strength. No cranial nerve deficit. Gait normal.  Skin: Skin is warm. No rash noted. No erythema.  Psychiatric: His mood appears anxious. Cognition and memory are normal.  Well groomed, good eye contact.      ASSESSMENT AND PLAN:   Mr. Agron Swiney Auvil was seen today for 6 months follow-up.  Orders Placed This Encounter  Procedures  . Basic metabolic panel  . Lipid panel  . TSH   Lab Results  Component Value Date   TSH 6.59 (H) 03/09/2018   Lab Results  Component Value Date   CREATININE 1.22 03/09/2018   BUN 20 03/09/2018   NA 142 03/09/2018   K 4.3  03/09/2018   CL 108 03/09/2018   CO2 26 03/09/2018   Lab Results  Component Value Date   CHOL 222 (H) 03/09/2018   HDL 37.50 (L) 03/09/2018   LDLCALC 164 (H) 03/09/2018   TRIG 102.0 03/09/2018   CHOLHDL 6 03/09/2018    Essential hypertension Re-checked 135/90. HCTZ 25 mg added. No changes in rest of meds. DASH-low salt diet recommended. Eye exam is due. Instructed to let me know what BP readings in about 2 to 3 weeks. F/U in 6 months, before if needed.   Hyperlipidemia He will continue on nonpharmacologic treatment for now. Further recommendations will be given according to lipid panel results as well as 10 years CVD risk.  Hypothyroidism (acquired) No changes in current management, will follow labs done today and will give further recommendations accordingly.   Allergic rhinitis Educated about diagnosis and treatment options. Instructed to avoid nasal Afrin, we discussed some possible side effects. Flonase nasal spray at the time recommended.     Ricci Dirocco G. [place], MD  [facility]. [place] office.

## 2018-03-09 NOTE — Assessment & Plan Note (Signed)
Educated about diagnosis and treatment options. Instructed to avoid nasal Afrin, we discussed some possible side effects. Flonase nasal spray at the time recommended.

## 2018-03-11 ENCOUNTER — Other Ambulatory Visit: Payer: Self-pay | Admitting: Family Medicine

## 2018-03-11 DIAGNOSIS — N183 Chronic kidney disease, stage 3 unspecified: Secondary | ICD-10-CM

## 2018-03-11 DIAGNOSIS — I10 Essential (primary) hypertension: Secondary | ICD-10-CM

## 2018-03-13 ENCOUNTER — Encounter: Payer: Self-pay | Admitting: Family Medicine

## 2018-03-13 MED ORDER — SYNTHROID 75 MCG PO TABS: 75.0000 ug | ORAL_TABLET | Freq: Every day | ORAL | 2 refills | Status: DC

## 2018-03-13 MED ORDER — LIPITOR 20 MG PO TABS: 20.0000 mg | ORAL_TABLET | Freq: Every day | ORAL | 2 refills | Status: DC

## 2018-03-17 ENCOUNTER — Telehealth: Payer: Self-pay | Admitting: Family Medicine

## 2018-03-17 NOTE — Telephone Encounter (Signed)
Copied from CRM [phone]. Topic: Quick Communication - Rx Refill/Question >> Mar 17, 2018 10:22 AM Maia Pettiesrtiz, Kristie S wrote: Medication: LIPITOR 20 MG tablet - pt states pharmacy advised pt that PA is required and they faxed office 03/16/18  Has the patient contacted their pharmacy? yes Preferred Pharmacy (with phone number or street name): [id number] [place], [place] - [address] [facility] [address] [phone] (Phone) [phone] (Fax)

## 2018-03-18 ENCOUNTER — Other Ambulatory Visit: Payer: Self-pay | Admitting: *Deleted

## 2018-03-18 MED ORDER — ATORVASTATIN CALCIUM 20 MG PO TABS: 20.0000 mg | ORAL_TABLET | Freq: Every day | ORAL | 3 refills | Status: DC

## 2018-03-18 NOTE — Telephone Encounter (Signed)
Patient wants to know if PCP is okay with him having the generic and not the brand name, if so then the generic can be sent. Please advise

## 2018-03-18 NOTE — Telephone Encounter (Signed)
PA needed for Lipitor. Thanks

## 2018-03-18 NOTE — Telephone Encounter (Signed)
[  Usually when a prescription is sent to the pharmacy generic option is the one provided for patients. Brand name is not covered by some insurance and can be more expensive.]  If his insurance did not approve brand name, he can certainly take the generic form.  Thanks, BJ

## 2018-03-18 NOTE — Telephone Encounter (Signed)
Generic Rx for Lipitor sent to the pharmacy.

## 2018-03-18 NOTE — Telephone Encounter (Signed)
I called the pharmacy for info to submit a prior auth.  The pharmacist stated a PA was requested due to the brand name being sent in.  I called the pt and asked the reason a brand name Rx was needed and if there was a previous reaction,etc and he stated this was not so.  Message sent to Dr Elvis CoilJordan's asst.

## 2018-03-23 NOTE — Telephone Encounter (Signed)
I called the pharmacy and spoke with the John.  He stated the generic was sent in, a prior auth is no longer needed at this time and the pt has picked up the Rx.

## 2018-03-27 ENCOUNTER — Other Ambulatory Visit: Payer: Self-pay | Admitting: Family Medicine

## 2018-03-27 DIAGNOSIS — N183 Chronic kidney disease, stage 3 unspecified: Secondary | ICD-10-CM

## 2018-03-27 DIAGNOSIS — I10 Essential (primary) hypertension: Secondary | ICD-10-CM

## 2018-04-13 ENCOUNTER — Telehealth: Payer: Self-pay | Admitting: *Deleted

## 2018-04-13 NOTE — Telephone Encounter (Signed)
[facility] faxed a note stating the pts insurance will not cover brand-name Synthroid and a prior Berkley Harvey is needed.  I called the pt to ask why the brand name was given and questioned if he had a reaction or problem with the generic and he stated this was not so and he stated to ask Dr Swaziland.  Message sent to Dr Swaziland to find the reason the brand was requested.

## 2018-04-13 NOTE — Telephone Encounter (Signed)
Message sent to Dr. Jordan for review and approval. 

## 2018-04-14 ENCOUNTER — Ambulatory Visit: Payer: Self-pay | Admitting: *Deleted

## 2018-04-14 ENCOUNTER — Encounter: Payer: Self-pay | Admitting: Family Medicine

## 2018-04-14 NOTE — Telephone Encounter (Signed)
Pt calling back stating that he only have one pill left of the synthroid he would like a call back today [phone]

## 2018-04-14 NOTE — Telephone Encounter (Signed)
Pt. Calling to ask if he should be on HCTZ. I informed him that 03/09/18 OV his provider added HCTZ 25 md daily. Reports he picked it up at pharmacy, but had not started it. Reports he will start it tomorrow. Will continued to monitor his BP.

## 2018-04-14 NOTE — Telephone Encounter (Signed)
Attempted to contact pt; left message on voicemail [phone].

## 2018-04-15 ENCOUNTER — Encounter: Payer: Self-pay | Admitting: *Deleted

## 2018-04-15 ENCOUNTER — Other Ambulatory Visit: Payer: Self-pay | Admitting: *Deleted

## 2018-04-15 MED ORDER — LEVOTHYROXINE SODIUM 75 MCG PO TABS: 75.0000 ug | ORAL_TABLET | Freq: Every day | ORAL | 3 refills | Status: DC

## 2018-04-15 NOTE — Telephone Encounter (Signed)
Noted  

## 2018-04-15 NOTE — Telephone Encounter (Signed)
Convey information to pt, brand name denied by his insurance.So he needs to consider taking generic levothyroxine. Thanks, BJ

## 2018-04-15 NOTE — Telephone Encounter (Signed)
Patient informed. 

## 2018-05-09 ENCOUNTER — Telehealth: Payer: Self-pay | Admitting: Family Medicine

## 2018-05-09 NOTE — Telephone Encounter (Signed)
Copied from CRM [id number]. Topic: Quick Communication - Rx Refill/Question >> May 09, 2018  4:13 PM Jens Som A wrote: Medication: levothyroxine (SYNTHROID, LEVOTHROID) 75 MCG tablet [[id number]] AVAPRO 300 MG tablet [[id number]]   Has the patient contacted their pharmacy? Yes  (Agent: If no, request that the patient contact the pharmacy for the refill.) (Agent: If yes, when and what did the pharmacy advise?)  Preferred Pharmacy (with phone number or street name): [id number], [place] - [address] AT [facility] [address] [phone] (Phone) [phone] (Fax)    Agent: Please be advised that RX refills may take up to 3 business days. We ask that you follow-up with your pharmacy.

## 2018-05-10 ENCOUNTER — Telehealth: Payer: Self-pay | Admitting: Family Medicine

## 2018-05-10 ENCOUNTER — Other Ambulatory Visit: Payer: Self-pay | Admitting: *Deleted

## 2018-05-10 DIAGNOSIS — I10 Essential (primary) hypertension: Secondary | ICD-10-CM

## 2018-05-10 MED ORDER — LEVOTHYROXINE SODIUM 75 MCG PO TABS: 75.0000 ug | ORAL_TABLET | Freq: Every day | ORAL | 3 refills | Status: DC

## 2018-05-10 MED ORDER — AVAPRO 300 MG PO TABS: 300.0000 mg | ORAL_TABLET | Freq: Every day | ORAL | 2 refills | Status: DC

## 2018-05-10 NOTE — Telephone Encounter (Signed)
Rx's sent to pharmacy as requested. 

## 2018-05-10 NOTE — Telephone Encounter (Signed)
Copied from CRM [id number]. Topic: Quick Communication - See Telephone Encounter >> May 10, 2018  9:51 AM Lorrine Kin, NT wrote: CRM for notification. See Telephone encounter for: 05/10/18. Patient calling and states that the pharmacy is telling him that his medications AVAPRO 300 MG tablet and levothyroxine (SYNTHROID, LEVOTHROID) 75 MCG tablet are needing a prior authorization done. Please advise. Patient only has 2 avapro pills left.

## 2018-05-11 NOTE — Telephone Encounter (Signed)
Prior auth for brand name Avapro 300mg  sent for a prior auth via [email or web address] as this could not be called in or sent via [email or web address].  No prior auth done for Levothyroxine as this was not requested from the pharmacy and was addressed in a prior note (no PA needed due to not wanting brand thyroid medication).

## 2018-05-12 NOTE — Telephone Encounter (Signed)
Pt called in to check the status of PA on medications. Advised pt per notes below. Pt says that he is down to 1 pill on his BP medication and would like to know what he should do meanwhile he waits for PA process? ALSO, pt says that he had a reaction to one of the requested medications. I did advise per the [employer].   Pt would like a call back.

## 2018-05-13 NOTE — Telephone Encounter (Signed)
Pt is returning call, please call back

## 2018-05-13 NOTE — Telephone Encounter (Signed)
Left message to return call to clinic concerning medications. 

## 2018-05-15 ENCOUNTER — Other Ambulatory Visit: Payer: Self-pay | Admitting: Family Medicine

## 2018-05-16 NOTE — Telephone Encounter (Signed)
FYI sent to Dr. Jordan 

## 2018-05-16 NOTE — Telephone Encounter (Signed)
Fax received from [employer] stating the request for Avapro 300mg  was denied and this was given to Dr Elvis Coil asst.

## 2018-05-17 ENCOUNTER — Encounter: Payer: Self-pay | Admitting: Family Medicine

## 2018-05-17 NOTE — Telephone Encounter (Signed)
Please note: message from Dr. [place]il[place] with options was read to patient. (See previous note)

## 2018-05-17 NOTE — Telephone Encounter (Signed)
This encounter was created in error - please disregard.

## 2018-05-17 NOTE — Telephone Encounter (Signed)
Left message to give clinic a call back concerning medication. [facility] Nurse may give patient options per Dr. SwazilandJordan. CRM created.

## 2018-05-17 NOTE — Telephone Encounter (Signed)
Other options to replace Avapro are: Atacan, Edarbi, Micardis, eprosartan.  Thanks, BJ

## 2018-05-17 NOTE — Telephone Encounter (Signed)
Pt returning call. Would like for Dr. Swaziland to call him, wishes to speak to her directly. Very frustrated medication (Avapro) was denied by [employer].  C: [phone]

## 2018-05-18 ENCOUNTER — Encounter: Payer: Self-pay | Admitting: Family Medicine

## 2018-05-18 NOTE — Telephone Encounter (Signed)
Please asked patient to arrange an appointment if he wants to go through all the options for BP control. Thanks, BJ

## 2018-05-18 NOTE — Telephone Encounter (Signed)
Spoke with patient and he stated that he is on his wife's insurance and they were able to get an override for his medications. He will continue to take the Avapro and brand name Synthroid. Patient stated that he had an allergic reaction to the generic Synthroid before and it has taken him a long time to get B/P under control so he does not want to change any of his medications.

## 2018-05-18 NOTE — Telephone Encounter (Signed)
Message sent to Dr.Jordan. 

## 2018-08-08 ENCOUNTER — Telehealth: Payer: Self-pay | Admitting: Family Medicine

## 2018-08-08 ENCOUNTER — Other Ambulatory Visit: Payer: Self-pay | Admitting: *Deleted

## 2018-08-08 DIAGNOSIS — I10 Essential (primary) hypertension: Secondary | ICD-10-CM

## 2018-08-08 MED ORDER — HYDROCHLOROTHIAZIDE 25 MG PO TABS: 25.0000 mg | ORAL_TABLET | Freq: Every day | ORAL | 3 refills | Status: DC

## 2018-08-08 MED ORDER — ATORVASTATIN CALCIUM 20 MG PO TABS: 20.0000 mg | ORAL_TABLET | Freq: Every day | ORAL | 3 refills | Status: DC

## 2018-08-08 MED ORDER — AMLODIPINE BESYLATE 10 MG PO TABS: 10.0000 mg | ORAL_TABLET | Freq: Every day | ORAL | 2 refills | Status: DC

## 2018-08-08 MED ORDER — AVAPRO 300 MG PO TABS: 300.0000 mg | ORAL_TABLET | Freq: Every day | ORAL | 2 refills | Status: DC

## 2018-08-08 MED ORDER — LEVOTHYROXINE SODIUM 75 MCG PO TABS: 75.0000 ug | ORAL_TABLET | Freq: Every day | ORAL | 3 refills | Status: DC

## 2018-08-08 NOTE — Telephone Encounter (Signed)
Copied from CRM [id number]. Topic: Quick Communication - Rx Refill/Question >> Aug 08, 2018 12:53 PM Wyonia Hough E wrote: Medication: Pt has new insurance and his pharmacy [facility] amLODipine (NORVASC) 10 MG tablet  AVAPRO 300 MG tablet  atorvastatin (LIPITOR) 20 MG tablet levothyroxine (SYNTHROID, LEVOTHROID) 75 MCG tablet  hydrochlorothiazide (HYDRODIURIL) 25 MG tablet   Has the patient contacted their pharmacy? Yes   Preferred Pharmacy (with phone number or street name): [id number], [place] - [address] [phone] (Phone) [phone] (Fax)    Agent: Please be advised that RX refills may take up to 3 business days. We ask that you follow-up with your pharmacy.

## 2018-08-08 NOTE — Telephone Encounter (Signed)
Rx's sent to the pharmacy as requested. 

## 2018-12-07 DIAGNOSIS — H903 Sensorineural hearing loss, bilateral: Secondary | ICD-10-CM | POA: Diagnosis not present

## 2019-02-01 ENCOUNTER — Encounter: Payer: Self-pay | Admitting: Family Medicine

## 2019-02-01 ENCOUNTER — Other Ambulatory Visit: Payer: Self-pay

## 2019-02-01 ENCOUNTER — Ambulatory Visit: Payer: Self-pay

## 2019-02-01 ENCOUNTER — Ambulatory Visit: Payer: 59 | Admitting: Family Medicine

## 2019-02-01 VITALS — BP 102/68 | HR 64 | Temp 97.4°F | Resp 16 | Ht 71.0 in | Wt 212.3 lb

## 2019-02-01 DIAGNOSIS — E039 Hypothyroidism, unspecified: Secondary | ICD-10-CM

## 2019-02-01 DIAGNOSIS — R42 Dizziness and giddiness: Secondary | ICD-10-CM | POA: Diagnosis not present

## 2019-02-01 DIAGNOSIS — E785 Hyperlipidemia, unspecified: Secondary | ICD-10-CM | POA: Diagnosis not present

## 2019-02-01 DIAGNOSIS — I10 Essential (primary) hypertension: Secondary | ICD-10-CM

## 2019-02-01 NOTE — Patient Instructions (Addendum)
Reduce the Avapro to 150 mg daily  Return tomorrow for labs- come at 7:20 AM

## 2019-02-01 NOTE — Telephone Encounter (Signed)
Pt. Called to report progressive dizziness and feeling lethargic over past 2 weeks.  Reported the dizziness has been present for 2 mos., but worse recently.  Stated it is present most of the time, but varies with Victorian of dizziness.  C/o feeling unsteady if walking downhill or on stairs.  Reported was prescribed 3 different BP medications to regulate his BP, and stopped taking the Amlodipine 4-5 mos. Ago, because he felt lethargic at that time.  Reported BP readings of 94/75-113/84 in past several days.  Stated he uses an Om[employer] BP monitor, and it reads "irregular heart beat detected" at times.  Reported heart rate readings of 97-109 over past several days.  Stated he feels somewhat Short of breath at night when he lays down, and can feel heart palpitations at times.  Stated if he uses Flonase, then the SOB eases.  Denied chest pain.  Reported "slight" headache in occipital area.  Reported no weakness in extremities, but when he lays on side, during night, his shoulders hurt.  Also reported his arm goes to sleep,intermittently, at night.  Denied speech difficulty.  Care advice given per protocol.  Encouraged to maintain hydration.  Pt. Verb. Understanding of instructions. Transferred to Scheduler to get an appt.  Agreed with plan.  Reason for Disposition . [1] MODERATE dizziness (e.g., interferes with normal activities) AND [2] has NOT been evaluated by physician for this  (Exception: dizziness caused by heat exposure, sudden standing, or poor fluid intake)  Answer Assessment - Initial Assessment Questions 1. DESCRIPTION: "Describe your dizziness."    Feels like in a fog 2. LIGHTHEADED: "Do you feel lightheaded?" (e.g., somewhat faint, woozy, weak upon standing)     Denied fainting  3. VERTIGO: "Do you feel like either you or the room is spinning or tilting?" (i.e. vertigo)     Denied spinning of room 4. SEVERITY: "How bad is it?"  "Do you feel like you are going to faint?" "Can you stand and walk?"   -  MILD - walking normally   - MODERATE - interferes with normal activities (e.g., work, school)    - SEVERE - unable to stand, requires support to walk, feels like passing out now.      Present all the time, and has times that it is worse; has worsening of symptoms over 2 weeks.  5. ONSET:  "When did the dizziness begin?"     Has been going on for  6. AGGRAVATING FACTORS: "Does anything make it worse?" (e.g., standing, change in head position)     Walking downhill makes him feel unsteady 7. HEART RATE: "Can you tell me your heart rate?" "How many beats in 15 seconds?"  (Note: not all patients can do this)       HR has been noted as irregular from Omron BP cuff 8. CAUSE: "What do you think is causing the dizziness?"    On 3 BP medications ; stopped taking one of the medications-Amlodopine 4-5 months due to feeling lethargic   9. RECURRENT SYMPTOM: "Have you had dizziness before?" If so, ask: "When was the last time?" "What happened that time?"    This has been going on x 2 mos.  10. OTHER SYMPTOMS: "Do you have any other symptoms?" (e.g., fever, chest pain, vomiting, diarrhea, bleeding)       Low BP readings 94/75, 98/77, 108/87; c/o slight headache, no speech problem, no weakness in extremity.  Blurred vision until adjusts contact  11. PREGNANCY: "Is there any chance you are  pregnant?" "When was your last menstrual period?"       N/a  Protocols used: DIZZINESS [facility]

## 2019-02-01 NOTE — Progress Notes (Signed)
Subjective:     Patient ID: [name], male   DOB: 19-Feb-1961, 58 y.o.   MRN: [id number]  HPI   Mr. Kienle is seen as a work-in with 102-month history of some nonspecific lightheadedness and dizziness.  Symptoms especially worse over the past couple of weeks.  He denies any syncope.  He has hypertension.  A year ago he was on 3 drug regimen of amlodipine, Avapro, and HCTZ.  His blood pressure started dropping and he eliminated the amlodipine 6 months ago.  He has been monitoring blood pressures regularly and has had several readings recently around 95 systolic and mostly around 70 diastolic.  He feels he is hydrating fairly well.  No chest pains.  Sometimes has sensation of difficulty getting a deep breath.  Chronic problems include hypertension, hypothyroidism, history of kidney stones, hyperlipidemia.  His thyroid was under replaced last winter and increase was made in levothyroxine to 75 mcg.  He has not had follow-up thyroid function since then.  He has history of migraine headaches but none recently.  He does have occasional dull headache posterior occipital area which radiates toward the neck region.  No exertional headaches.  Past Medical History:  Diagnosis Date  . ADHD   . Chronic kidney disease   . GERD (gastroesophageal reflux disease)   . Hypertension   . Migraine headache without aura    Past Surgical History:  Procedure Laterality Date  . HERNIA REPAIR  2010   Bilateral inguinal    reports that he has never smoked. He has never used smokeless tobacco. He reports current alcohol use. He reports that he does not use drugs. family history includes Cancer in his paternal aunt and paternal grandmother; Mental illness in his mother. Allergies  Allergen Reactions  . Sulfa Antibiotics Other (See Comments) and Diarrhea    diarrhea  . Benazepril Hcl Swelling    Throat "closing up"  . Penicillins Other (See Comments)    Unknown     Review of Systems   Constitutional: Negative for chills, fever and unexpected weight change.  Eyes: Negative for visual disturbance.  Respiratory: Negative for cough and chest tightness.   Cardiovascular: Negative for chest pain, palpitations and leg swelling.  Endocrine: Negative for polydipsia and polyuria.  Neurological: Positive for dizziness and light-headedness. Negative for seizures, syncope, speech difficulty and weakness.  Hematological: Negative for adenopathy.       Objective:   Physical Exam Vitals signs reviewed.  Constitutional:      Appearance: Normal appearance.  Cardiovascular:     Rate and Rhythm: Normal rate and regular rhythm.  Pulmonary:     Effort: Pulmonary effort is normal.     Breath sounds: Normal breath sounds.  Musculoskeletal:     Right lower leg: No edema.     Left lower leg: No edema.  Neurological:     General: No focal deficit present.     Mental Status: He is alert and oriented to person, place, and time.     Cranial Nerves: No cranial nerve deficit.        Assessment:     #1 nonspecific lightheadedness and dizziness.  He does not have any demonstrated orthostatic drop today but blood pressures have been on the low side recently as above  #2 hypothyroidism- overdue for labs  #3 dyslipidemia treated with atorvastatin    Plan:     -Suggested he try dropping Avapro to 150 mg daily.  He will cut his 300 mg in half.  Also encouraged to stay well-hydrated. -Patient wanting to go ahead and get recheck of labs but laboratory left at the time of his visit.  He will come tomorrow for fasting labs including lipid, hepatic, basic metabolic panel, TSH, and CBC. -Recommend close follow-up soon with Dr. Swaziland and especially if dizziness not improving with reduction in blood pressure medications  Kristian Covey MD [facility]

## 2019-02-01 NOTE — Telephone Encounter (Signed)
Noted. FYI Dr. [name]

## 2019-02-02 ENCOUNTER — Other Ambulatory Visit (INDEPENDENT_AMBULATORY_CARE_PROVIDER_SITE_OTHER): Payer: 59

## 2019-02-02 DIAGNOSIS — R42 Dizziness and giddiness: Secondary | ICD-10-CM

## 2019-02-02 DIAGNOSIS — E785 Hyperlipidemia, unspecified: Secondary | ICD-10-CM

## 2019-02-02 DIAGNOSIS — E039 Hypothyroidism, unspecified: Secondary | ICD-10-CM | POA: Diagnosis not present

## 2019-02-02 LAB — CBC WITH DIFFERENTIAL/PLATELET
Basophils Absolute: 0.1 10*3/uL (ref 0.0–0.1)
Basophils Relative: 1 % (ref 0.0–3.0)
Eosinophils Absolute: 0.2 10*3/uL (ref 0.0–0.7)
Eosinophils Relative: 4.3 % (ref 0.0–5.0)
HCT: 43.5 % (ref 39.0–52.0)
Hemoglobin: 14.8 g/dL (ref 13.0–17.0)
Lymphocytes Relative: 35.2 % (ref 12.0–46.0)
Lymphs Abs: 1.7 10*3/uL (ref 0.7–4.0)
MCHC: 34 g/dL (ref 30.0–36.0)
MCV: 88.9 fl (ref 78.0–100.0)
Monocytes Absolute: 0.5 10*3/uL (ref 0.1–1.0)
Monocytes Relative: 9.2 % (ref 3.0–12.0)
Neutro Abs: 2.5 10*3/uL (ref 1.4–7.7)
Neutrophils Relative %: 50.3 % (ref 43.0–77.0)
Platelets: 219 10*3/uL (ref 150.0–400.0)
RBC: 4.89 Mil/uL (ref 4.22–5.81)
RDW: 13.7 % (ref 11.5–15.5)
WBC: 5 10*3/uL (ref 4.0–10.5)

## 2019-02-02 LAB — BASIC METABOLIC PANEL
BUN: 29 mg/dL — ABNORMAL HIGH (ref 6–23)
CO2: 29 mEq/L (ref 19–32)
Calcium: 9 mg/dL (ref 8.4–10.5)
Chloride: 104 mEq/L (ref 96–112)
Creatinine, Ser: 1.41 mg/dL (ref 0.40–1.50)
GFR: 51.29 mL/min — ABNORMAL LOW (ref >60.00)
Glucose, Bld: 90 mg/dL (ref 70–99)
Potassium: 3.8 mEq/L (ref 3.5–5.1)
Sodium: 140 mEq/L (ref 135–145)

## 2019-02-02 LAB — HEPATIC FUNCTION PANEL
ALT: 17 U/L (ref 0–53)
AST: 17 U/L (ref 0–37)
Albumin: 4.1 g/dL (ref 3.5–5.2)
Alkaline Phosphatase: 53 U/L (ref 39–117)
Bilirubin, Direct: 0.1 mg/dL (ref 0.0–0.3)
Total Bilirubin: 0.5 mg/dL (ref 0.2–1.2)
Total Protein: 5.9 g/dL — ABNORMAL LOW (ref 6.0–8.3)

## 2019-02-02 LAB — LIPID PANEL
Cholesterol: 166 mg/dL (ref 0–200)
HDL: 36 mg/dL — ABNORMAL LOW (ref >39.00)
LDL Cholesterol: 107 mg/dL — ABNORMAL HIGH (ref 0–99)
NonHDL: 129.8
Total CHOL/HDL Ratio: 5
Triglycerides: 112 mg/dL (ref 0.0–149.0)
VLDL: 22.4 mg/dL (ref 0.0–40.0)

## 2019-02-03 LAB — TSH: TSH: 8 u[IU]/mL — ABNORMAL HIGH (ref 0.35–4.50)

## 2019-02-06 ENCOUNTER — Telehealth: Payer: Self-pay | Admitting: Family Medicine

## 2019-02-06 MED ORDER — LEVOTHYROXINE SODIUM 100 MCG PO TABS: 100.0000 ug | ORAL_TABLET | Freq: Every day | ORAL | 0 refills | Status: DC

## 2019-02-06 NOTE — Telephone Encounter (Signed)
I scheduled the patient for 05/08/2018 at 8 AM with Dr. [name]

## 2019-02-06 NOTE — Telephone Encounter (Signed)
[name], I spoke with pt and he is requesting a morning appt if possible with Dr. [name]. Thanks    Notes recorded by Eulas Post, MD on 02/06/2019 at 12:36 PM EST  Thyroid under replaced. Increase Levothyroxine to 100 mcg daily and make sure he has follow up with Dr [name] within 3 months.

## 2019-04-06 ENCOUNTER — Other Ambulatory Visit: Payer: 59

## 2019-04-11 ENCOUNTER — Other Ambulatory Visit: Payer: Self-pay

## 2019-04-11 ENCOUNTER — Other Ambulatory Visit (INDEPENDENT_AMBULATORY_CARE_PROVIDER_SITE_OTHER): Payer: 59

## 2019-04-11 DIAGNOSIS — E039 Hypothyroidism, unspecified: Secondary | ICD-10-CM | POA: Diagnosis not present

## 2019-04-11 LAB — TSH: TSH: 3.01 u[IU]/mL (ref 0.35–4.50)

## 2019-04-13 ENCOUNTER — Other Ambulatory Visit: Payer: Self-pay

## 2019-04-14 ENCOUNTER — Encounter: Payer: Self-pay | Admitting: Family Medicine

## 2019-04-14 ENCOUNTER — Other Ambulatory Visit: Payer: Self-pay | Admitting: Family Medicine

## 2019-04-14 ENCOUNTER — Ambulatory Visit: Payer: 59 | Admitting: Family Medicine

## 2019-04-14 ENCOUNTER — Other Ambulatory Visit: Payer: 59

## 2019-04-14 VITALS — BP 118/70 | HR 83 | Temp 96.1°F | Resp 12 | Ht 71.0 in | Wt 214.0 lb

## 2019-04-14 DIAGNOSIS — E039 Hypothyroidism, unspecified: Secondary | ICD-10-CM | POA: Diagnosis not present

## 2019-04-14 DIAGNOSIS — R200 Anesthesia of skin: Secondary | ICD-10-CM | POA: Diagnosis not present

## 2019-04-14 DIAGNOSIS — M722 Plantar fascial fibromatosis: Secondary | ICD-10-CM | POA: Diagnosis not present

## 2019-04-14 DIAGNOSIS — Z1152 Encounter for screening for COVID-19: Secondary | ICD-10-CM | POA: Diagnosis not present

## 2019-04-14 DIAGNOSIS — I10 Essential (primary) hypertension: Secondary | ICD-10-CM

## 2019-04-14 DIAGNOSIS — R42 Dizziness and giddiness: Secondary | ICD-10-CM

## 2019-04-14 LAB — POCT GLYCOSYLATED HEMOGLOBIN (HGB A1C): Hemoglobin A1C: 5.5 % (ref 4.0–5.6)

## 2019-04-14 MED ORDER — MECLIZINE HCL 25 MG PO TABS: 25.0000 mg | ORAL_TABLET | Freq: Three times a day (TID) | ORAL | 0 refills | Status: DC | PRN

## 2019-04-14 MED ORDER — AVAPRO 300 MG PO TABS: 150.0000 mg | ORAL_TABLET | Freq: Every day | ORAL | 0 refills | Status: DC

## 2019-04-14 NOTE — Patient Instructions (Addendum)
A few things to remember from today's visit:   Hypothyroidism (acquired)  Essential hypertension - Plan: AVAPRO 300 MG tablet  Hyperlipidemia, unspecified hyperlipidemia type  Dizziness - Plan: VAS US CAROTID, Ambulatory referral to Physical Therapy, meclizine (ANTIVERT) 25 MG tablet  Vertigo of central origin - Plan: MR Brain Wo Contrast  Plantar fasciitis  Numbness in feet  Dizziness is a perception of movement, it is sometimes difficult to describe and can be  caused by different problems, most benign but others can be life threaten.  Vertigo is the most common cause of dizziness, usually related with inner ear and can be associated with nausea, vomiting, and unbalance sensation. It can be complicated by falls due to lose of balance; so fall precautions are very important.  Most of the time dizziness is benign, usually intermittent, last a few seconds at the time and aggravated by certain positions. It usually resolves in a few weeks without residual effect but it could be recurrent.  Sometimes blood work is ordered to evaluate for other possible causes.  Dizziness can also be caused by certain medications, dehydration, migraines, and strokes.  Medication prescribed for vertigo, Meclizine, causes drowsiness/sleepiness, so frequently I recommended taking it at bedtime. I also recommend what we called vestibular exercise,referral placed.  Seek immediate medical attention if: New severe headache, dobble vision, fever (100 F or more), associated numbness/tingling, focal weakness, persistent vomiting, not able to walk, or sudden worsening symptoms.   Plantar Fasciitis  Plantar fasciitis is a painful foot condition that affects the heel. It occurs when the band of tissue that connects the toes to the heel bone (plantar fascia) becomes irritated. This can happen as the result of exercising too much or doing other repetitive activities (overuse injury). The pain from plantar fasciitis  can range from mild irritation to severe pain that makes it difficult to walk or move. The pain is usually worse in the morning after sleeping, or after sitting or lying down for a while. Pain may also be worse after long periods of walking or standing. What are the causes? This condition may be caused by:  Standing for long periods of time.  Wearing shoes that do not have good arch support.  Doing activities that put stress on joints (high-impact activities), including running, aerobics, and ballet.  Being overweight.  An abnormal way of walking (gait).  Tight muscles in the back of your lower leg (calf).  High arches in your feet.  Starting a new athletic activity. What are the signs or symptoms? The main symptom of this condition is heel pain. Pain may:  Be worse with first steps after a time of rest, especially in the morning after sleeping or after you have been sitting or lying down for a while.  Be worse after long periods of standing still.  Decrease after 30-45 minutes of activity, such as gentle walking. How is this diagnosed? This condition may be diagnosed based on your medical history and your symptoms. Your health care provider may ask questions about your activity level. Your health care provider will do a physical exam to check for:  A tender area on the bottom of your foot.  A high arch in your foot.  Pain when you move your foot.  Difficulty moving your foot. You may have imaging tests to confirm the diagnosis, such as:  X-rays.  Ultrasound.  MRI. How is this treated? Treatment for plantar fasciitis depends on how severe your condition is. Treatment may include:  Rest,  ice, applying pressure (compression), and raising the affected foot (elevation). This may be called RICE therapy. Your health care provider may recommend RICE therapy along with over-the-counter pain medicines to manage your pain.  Exercises to stretch your calves and your plantar  fascia.  A splint that holds your foot in a stretched, upward position while you sleep (night splint).  Physical therapy to relieve symptoms and prevent problems in the future.  Injections of steroid medicine (cortisone) to relieve pain and inflammation.  Stimulating your plantar fascia with electrical impulses (extracorporeal shock wave therapy). This is usually the last treatment option before surgery.  Surgery, if other treatments have not worked after 12 months. Follow these instructions at home:  Managing pain, stiffness, and swelling  If directed, put ice on the painful area: ? Put ice in a plastic bag, or use a frozen bottle of water. ? Place a towel between your skin and the bag or bottle. ? Roll the bottom of your foot over the bag or bottle. ? Do this for 20 minutes, 2-3 times a day.  Wear athletic shoes that have air-sole or gel-sole cushions, or try wearing soft shoe inserts that are designed for plantar fasciitis.  Raise (elevate) your foot above the level of your heart while you are sitting or lying down. Activity  Avoid activities that cause pain. Ask your health care provider what activities are safe for you.  Do physical therapy exercises and stretches as told by your health care provider.  Try activities and forms of exercise that are easier on your joints (low-impact). Examples include swimming, water aerobics, and biking. General instructions  Take over-the-counter and prescription medicines only as told by your health care provider.  Wear a night splint while sleeping, if told by your health care provider. Loosen the splint if your toes tingle, become numb, or turn cold and blue.  Maintain a healthy weight, or work with your health care provider to lose weight as needed.  Keep all follow-up visits as told by your health care provider. This is important. Contact a health care provider if you:  Have symptoms that do not go away after caring for yourself at  home.  Have pain that gets worse.  Have pain that affects your ability to move or do your daily activities. Summary  Plantar fasciitis is a painful foot condition that affects the heel. It occurs when the band of tissue that connects the toes to the heel bone (plantar fascia) becomes irritated.  The main symptom of this condition is heel pain that may be worse after exercising too much or standing still for a long time.  Treatment varies, but it usually starts with rest, ice, compression, and elevation (RICE therapy) and over-the-counter medicines to manage pain. This information is not intended to replace advice given to you by your health care provider. Make sure you discuss any questions you have with your health care provider. Document Revised: 03/05/2017 Document Reviewed: 01/18/2017 Elsevier Patient Education  [place].  Please be sure medication list is accurate. If a new problem present, please set up appointment sooner than planned today.

## 2019-04-14 NOTE — Assessment & Plan Note (Signed)
BP adequately controlled. Continue monitoring BP. Low salt diet to continue. No changes in current management.

## 2019-04-14 NOTE — Assessment & Plan Note (Signed)
Stable. No changes in current management. F/U in 6-12 months. 

## 2019-04-14 NOTE — Progress Notes (Signed)
HPI:   [name] is a 59 y.o. male, who is here today for chronic disease management.  He was last seen on 03/09/2018.  Since his last visit he has been sick for acute concerns, 02/01/19, because dizziness. He has had dizziness intermittently since 12/2018. Dizziness seems to be getting worse. He feels like he is going to "pass out." Dizziness is not like spinning. No worsening hearing loss, bilateral hearing aids. + Nausea, no vomiting. + Tinnitus,worse at night. He has had tinnitus before,louder.  It seems to be exacerbated by bending down and getting up rapidly. Yesterday he had an episode in bed,when he turned head, alleviated by changing position.  Episodes last a few seconds at the time.  No associated chest pain,palpitation,SOB,or diaphoresis.  "Bad" occipital headache all day yesterday, 8/10. He took Tylenol.  -Hypothyroidism: His Levothyroxine dose was increased from 75 mcg to 100 mcg. Lab Results  Component Value Date   TSH 3.01 04/11/2019   Negative for fever,wt loss,tremor,cold/heat intolerance,or diarrhea.  HTN: Avapro dose was decreased from 300 mg to 150 mg. He is also on HCTZ 25 mg daily Negative for CP,visual changes,SOB,focal weakness , or edema.  BP readings: 110's-120/80's most of the time.  Lab Results  Component Value Date   CREATININE 1.41 02/02/2019   BUN 29 (H) 02/02/2019   NA 140 02/02/2019   K 3.8 02/02/2019   CL 104 02/02/2019   CO2 29 02/02/2019   For a few days he has had plantar pain,worse in the morning when first gets up. Alleviated with a few steps.  No hx of trauma. No local edema or erythema.  Review of Systems  Constitutional: Positive for fatigue. Negative for activity change and appetite change.  HENT: Negative for ear pain, facial swelling, nosebleeds, sore throat and trouble swallowing.   Respiratory: Negative for cough and wheezing.   Gastrointestinal: Negative for abdominal pain.  Endocrine:  Negative for polydipsia, polyphagia and polyuria.  Genitourinary: Negative for decreased urine volume and hematuria.  Musculoskeletal: Negative for gait problem and myalgias.  Skin: Negative for rash and wound.  Neurological: Negative for syncope and facial asymmetry.  Hematological: Negative for adenopathy. Does not bruise/bleed easily.  Rest of ROS, see pertinent positives sand negatives in HPI   Current Outpatient Medications on File Prior to Visit  Medication Sig Dispense Refill  . Ascorbic Acid (VITAMIN C) 1000 MG tablet Take by mouth.    Marland Kitchen aspirin 81 MG chewable tablet Chew 81 mg by mouth daily.    Marland Kitchen atorvastatin (LIPITOR) 20 MG tablet Take 1 tablet (20 mg total) by mouth daily. 90 tablet 3  . FLONASE 50 MCG/ACT nasal spray Place 2 sprays into both nostrils daily. 16 g 3  . Garlic 100 MG TABS Take by mouth.    . hydrochlorothiazide (HYDRODIURIL) 25 MG tablet Take 1 tablet (25 mg total) by mouth daily. 90 tablet 3  . HYDROcodone-acetaminophen (NORCO/VICODIN) 5-325 MG tablet Take 1-2 tablets by mouth every 6 (six) hours as needed for moderate pain. 14 tablet 0  . levothyroxine (SYNTHROID) 100 MCG tablet Take 1 tablet (100 mcg total) by mouth daily. 90 tablet 0  . Multiple Vitamin (MULTI-VITAMINS) TABS Take by mouth.    . Omega-3 1000 MG CAPS Take by mouth.    . ondansetron (ZOFRAN ODT) 4 MG disintegrating tablet Take 1 tablet (4 mg total) by mouth every 8 (eight) hours as needed. 10 tablet 1  . Vitamin D, Ergocalciferol, (DRISDOL) 50000 units CAPS  capsule Take by mouth.     No current facility-administered medications on file prior to visit.     Past Medical History:  Diagnosis Date  . ADHD   . Chronic kidney disease   . GERD (gastroesophageal reflux disease)   . Hypertension   . Migraine headache without aura    Allergies  Allergen Reactions  . Sulfa Antibiotics Other (See Comments) and Diarrhea    diarrhea  . Benazepril Hcl Swelling    Throat "closing up"  .  Penicillins Other (See Comments)    Unknown    Social History   Socioeconomic History  . Marital status: Married    Spouse name: Not on file  . Number of children: Not on file  . Years of education: Not on file  . Highest education level: Not on file  Occupational History  . Not on file  Tobacco Use  . Smoking status: Never Smoker  . Smokeless tobacco: Never Used  Substance and Sexual Activity  . Alcohol use: Yes    Alcohol/week: 0.0 standard drinks    Comment: occasionally  . Drug use: No  . Sexual activity: Yes  Other Topics Concern  . Not on file  Social History Narrative  . Not on file   Social Determinants of Health   Financial Resource Strain:   . Difficulty of Paying Living Expenses: Not on file  Food Insecurity:   . Worried About Charity fundraiser in the Last Year: Not on file  . Ran Out of Food in the Last Year: Not on file  Transportation Needs:   . Lack of Transportation (Medical): Not on file  . Lack of Transportation (Non-Medical): Not on file  Physical Activity:   . Days of Exercise per Week: Not on file  . Minutes of Exercise per Session: Not on file  Stress:   . Feeling of Stress : Not on file  Social Connections:   . Frequency of Communication with Friends and Family: Not on file  . Frequency of Social Gatherings with Friends and Family: Not on file  . Attends Religious Services: Not on file  . Active Member of Clubs or Organizations: Not on file  . Attends Archivist Meetings: Not on file  . Marital Status: Not on file    Vitals:   04/14/19 1450  BP: 118/70  Pulse: 83  Resp: 12  Temp: (!) 96.1 F (35.6 C)  SpO2: 95%   Body mass index is 29.85 kg/m.   Physical Exam  Nursing note and vitals reviewed. Constitutional: He is oriented to person, place, and time. He appears well-developed. No distress.  HENT:  Head: Normocephalic and atraumatic.  Mouth/Throat: Oropharynx is clear and moist and mucous membranes are normal.    Apley maneuver positive,left. Nystagmus minimal. Hearing aids bilateral.    Eyes: Pupils are equal, round, and reactive to light. Conjunctivae and EOM are normal.  Neck: Carotid bruit is not present. No thyroid mass present.  No bruit but dizziness was elicited by placing stethoscopy on left carotid.  Cardiovascular: Normal rate and regular rhythm.  Murmur (Soft SEM RUSB,stable) heard. Pulses:      Dorsalis pedis pulses are 2+ on the left side.       Posterior tibial pulses are 2+ on the left side.  Respiratory: Effort normal and breath sounds normal. No respiratory distress.  GI: Soft. He exhibits no mass. There is no hepatomegaly. There is no abdominal tenderness.  Musculoskeletal:  General: No tenderness or edema.     Lumbar back: No tenderness or bony tenderness.     Comments: Right foot:Tenderness upon palpation of heel mainly at the medial insertion of plantar fascia into calcaneous. Also mild discomfort with palpation along planta fascia towards forefoot. + bunion  Dorsal flexion of first MTP elicits pain.  No edema or erythema appreciated on area.  Left foot examination normal.   Lymphadenopathy:    He has no cervical adenopathy.  Neurological: He is alert and oriented to person, place, and time. No cranial nerve deficit. Gait normal.  Reflex Scores:      Patellar reflexes are 2+ on the right side and 2+ on the left side. Decreased monofilament and absent vibration  Bilateral.   Skin: Skin is warm. No rash noted. No erythema.  Psychiatric: He has a normal mood and affect.  Well groomed,good eye contact.   ASSESSMENT AND PLAN:   Mr. Josealfredo Adkins Ditullio was seen today for chronic disease management. months follow-up.  Orders Placed This Encounter  Procedures  . MR Brain Wo Contrast  . Ambulatory referral to Physical Therapy  . POC HgB A1c  . VAS US CAROTID   Lab Results  Component Value Date   HGBA1C 5.5 04/14/2019    Dizziness We discussed other  possible etiologies of dizziness, examination today suggest benign vertigo. Cental vertigo also to be considered. Explained that problem can be recurrent. Fall prevention. Vestibular exercises recommended, PT evaluation will be arranged. Meclizine 25 mg tid prn, some side effects discussed. Instructed about warning signs. F/U as needed.  - meclizine (ANTIVERT) 25 MG tablet; Take 1 tablet (25 mg total) by mouth 3 (three) times daily as needed for dizziness.  Dispense: 45 tablet; Refill: 0  Plantar fasciitis We discussed dx,prognosis,and treatment options. Night splint,stretching exercises,and comfortable shoe wear recommended. If persistent podiatry evaluation may be needed.  Numbness in feet During examination today numbness and absence of vibration sensation bilateral.He did not noted these before today. He has hx of lumbar pain with radiation to LLE. He has followed with ortho. Appropriate foot care discussed. If persistent we may need to repeat lumbar MRI.  Essential hypertension BP adequately controlled. Continue monitoring BP. Low salt diet to continue. No changes in current management.  Hypothyroidism (acquired) Stable. No changes in current management. F/U in 6-12 months.   OV time from 2:50 pm to 4:02 pm. 40 min face to face OV. > 50% was dedicated to discussion of Dx, prognosis, treatment options, and some side effects of medications.  Return in about 6 months (around 10/12/2019) for HTN,HLD.   Eliab Closson G. [place], MD  [facility]. [place] office.

## 2019-04-17 ENCOUNTER — Other Ambulatory Visit: Payer: 59

## 2019-04-26 ENCOUNTER — Ambulatory Visit (HOSPITAL_COMMUNITY)
Admission: RE | Admit: 2019-04-26 | Payer: 59 | Source: Ambulatory Visit | Attending: Family Medicine | Admitting: Family Medicine

## 2019-04-29 ENCOUNTER — Other Ambulatory Visit: Payer: Self-pay

## 2019-04-29 ENCOUNTER — Ambulatory Visit
Admission: RE | Admit: 2019-04-29 | Discharge: 2019-04-29 | Disposition: A | Discharge status: Home / Self Care | Payer: 59 | Source: Ambulatory Visit | Attending: Family Medicine | Admitting: Family Medicine

## 2019-04-29 DIAGNOSIS — R519 Headache, unspecified: Secondary | ICD-10-CM | POA: Diagnosis not present

## 2019-04-29 DIAGNOSIS — R42 Dizziness and giddiness: Secondary | ICD-10-CM | POA: Diagnosis not present

## 2019-04-30 ENCOUNTER — Other Ambulatory Visit: Payer: Self-pay | Admitting: Family Medicine

## 2019-05-01 ENCOUNTER — Encounter: Payer: Self-pay | Admitting: Family Medicine

## 2019-05-02 ENCOUNTER — Other Ambulatory Visit: Payer: Self-pay | Admitting: Family Medicine

## 2019-05-08 ENCOUNTER — Other Ambulatory Visit: Payer: Self-pay | Admitting: Family Medicine

## 2019-05-08 DIAGNOSIS — J341 Cyst and mucocele of nose and nasal sinus: Secondary | ICD-10-CM

## 2019-05-09 ENCOUNTER — Ambulatory Visit: Payer: 59 | Admitting: Family Medicine

## 2019-05-23 DIAGNOSIS — J341 Cyst and mucocele of nose and nasal sinus: Secondary | ICD-10-CM | POA: Diagnosis not present

## 2019-05-23 DIAGNOSIS — Z8673 Personal history of transient ischemic attack (TIA), and cerebral infarction without residual deficits: Secondary | ICD-10-CM | POA: Diagnosis not present

## 2019-05-23 DIAGNOSIS — J343 Hypertrophy of nasal turbinates: Secondary | ICD-10-CM | POA: Diagnosis not present

## 2019-05-23 DIAGNOSIS — R42 Dizziness and giddiness: Secondary | ICD-10-CM | POA: Diagnosis not present

## 2019-05-24 ENCOUNTER — Ambulatory Visit (HOSPITAL_COMMUNITY)
Admission: RE | Admit: 2019-05-24 | Discharge: 2019-05-24 | Disposition: A | Discharge status: Home / Self Care | Payer: 59 | Source: Ambulatory Visit | Attending: Cardiology | Admitting: Cardiology

## 2019-05-24 ENCOUNTER — Other Ambulatory Visit: Payer: Self-pay

## 2019-05-24 DIAGNOSIS — R42 Dizziness and giddiness: Secondary | ICD-10-CM

## 2019-05-29 ENCOUNTER — Encounter: Payer: Self-pay | Admitting: Family Medicine

## 2019-06-02 DIAGNOSIS — R42 Dizziness and giddiness: Secondary | ICD-10-CM | POA: Diagnosis not present

## 2019-06-07 ENCOUNTER — Other Ambulatory Visit: Payer: Self-pay | Admitting: Family Medicine

## 2019-06-07 DIAGNOSIS — R42 Dizziness and giddiness: Secondary | ICD-10-CM

## 2019-06-08 ENCOUNTER — Encounter: Payer: Self-pay | Admitting: Neurology

## 2019-06-13 DIAGNOSIS — H524 Presbyopia: Secondary | ICD-10-CM | POA: Diagnosis not present

## 2019-07-27 NOTE — Progress Notes (Deleted)
NEUROLOGY CONSULTATION NOTE  [name] MRN: [id number] DOB: Sep 20, 1960  Referring provider: [name] [place], MD Primary care provider: [name] [place], MD  Reason for consult:  dizziness  HISTORY OF PRESENT ILLNESS: [name]. Pafford is a 59 year old ***-handed white male with HTN and CKD who presents for dizziness.  History supplemented by internal medicine and ENT notes.  ***.  Since last summer, he has felt dizziness, ***.  He also reports lethargy and difficulty breathing through his nose when he lies down at night.  ***.  He reports occasional dull nonthrobbing occipital headache radiating into the neck ***.  He has history of migraines but ***.  MRI of brain without contrast from 04/29/2019 personally reviewed demonstrated mild chronic small vessel ischemic changes and chronic lacunar infarcts in the right basal ganglia and left cerebellum but no acute intracranial abnormality.  Follow up carotid ultrasound on 05/24/2019 showed no hemodynamically significant stenosis in the bilateral carotid arteries; antegrade flow noted in the bilateral vertebral arteries.  He was evaluated by ENT in February.  The retention cysts were thought to be incidental and that the nasal obstruction was secondary to turbinate hypertrophy and was prescribed nasal steroids.  Dix-Hallpike was negative.      PAST MEDICAL HISTORY: Past Medical History:  Diagnosis Date  . ADHD   . Chronic kidney disease   . GERD (gastroesophageal reflux disease)   . Hypertension   . Migraine headache without aura     PAST SURGICAL HISTORY: Past Surgical History:  Procedure Laterality Date  . HERNIA REPAIR  2010   Bilateral inguinal    MEDICATIONS: Current Outpatient Medications on File Prior to Visit  Medication Sig Dispense Refill  . Ascorbic Acid (VITAMIN C) 1000 MG tablet Take by mouth.    Marland Kitchen aspirin 81 MG chewable tablet Chew 81 mg by mouth daily.    Marland Kitchen atorvastatin (LIPITOR) 20 MG tablet Take 1 tablet (20 mg  total) by mouth daily. 90 tablet 3  . AVAPRO 300 MG tablet Take 0.5 tablets (150 mg total) by mouth daily. 45 tablet 0  . FLONASE 50 MCG/ACT nasal spray Place 2 sprays into both nostrils daily. 16 g 3  . Garlic 100 MG TABS Take by mouth.    . hydrochlorothiazide (HYDRODIURIL) 25 MG tablet Take 1 tablet (25 mg total) by mouth daily. 90 tablet 3  . HYDROcodone-acetaminophen (NORCO/VICODIN) 5-325 MG tablet Take 1-2 tablets by mouth every 6 (six) hours as needed for moderate pain. 14 tablet 0  . levothyroxine (SYNTHROID) 100 MCG tablet TAKE 1 TABLET BY MOUTH EVERY DAY 90 tablet 0  . meclizine (ANTIVERT) 25 MG tablet Take 1 tablet (25 mg total) by mouth 3 (three) times daily as needed for dizziness. 45 tablet 0  . Multiple Vitamin (MULTI-VITAMINS) TABS Take by mouth.    . Omega-3 1000 MG CAPS Take by mouth.    . ondansetron (ZOFRAN ODT) 4 MG disintegrating tablet Take 1 tablet (4 mg total) by mouth every 8 (eight) hours as needed. 10 tablet 1  . Vitamin D, Ergocalciferol, (DRISDOL) 50000 units CAPS capsule Take by mouth.     No current facility-administered medications on file prior to visit.    ALLERGIES: Allergies  Allergen Reactions  . Sulfa Antibiotics Other (See Comments) and Diarrhea    diarrhea  . Benazepril Hcl Swelling    Throat "closing up"  . Penicillins Other (See Comments)    Unknown    FAMILY HISTORY: Family History  Problem Relation Age of  Onset  . Mental illness Mother   . Cancer Paternal Aunt        lung  . Cancer Paternal Grandmother        lung  . Diabetes Neg Hx    ***.  SOCIAL HISTORY: Social History   Socioeconomic History  . Marital status: Married    Spouse name: Not on file  . Number of children: Not on file  . Years of education: Not on file  . Highest education level: Not on file  Occupational History  . Not on file  Tobacco Use  . Smoking status: Never Smoker  . Smokeless tobacco: Never Used  Substance and Sexual Activity  . Alcohol use:  Yes    Alcohol/week: 0.0 standard drinks    Comment: occasionally  . Drug use: No  . Sexual activity: Yes  Other Topics Concern  . Not on file  Social History Narrative  . Not on file   Social Determinants of Health   Financial Resource Strain:   . Difficulty of Paying Living Expenses:   Food Insecurity:   . Worried About Programme researcher, broadcasting/film/video in the Last Year:   . Barista in the Last Year:   Transportation Needs:   . Freight forwarder (Medical):   Marland Kitchen Lack of Transportation (Non-Medical):   Physical Activity:   . Days of Exercise per Week:   . Minutes of Exercise per Session:   Stress:   . Feeling of Stress :   Social Connections:   . Frequency of Communication with Friends and Family:   . Frequency of Social Gatherings with Friends and Family:   . Attends Religious Services:   . Active Member of Clubs or Organizations:   . Attends Banker Meetings:   Marland Kitchen Marital Status:   Intimate Partner Violence:   . Fear of Current or Ex-Partner:   . Emotionally Abused:   Marland Kitchen Physically Abused:   . Sexually Abused:     REVIEW OF SYSTEMS: Constitutional: No fevers, chills, or sweats, no generalized fatigue, change in appetite Eyes: No visual changes, double vision, eye pain Ear, nose and throat: No hearing loss, ear pain, nasal congestion, sore throat Cardiovascular: No chest pain, palpitations Respiratory:  No shortness of breath at rest or with exertion, wheezes GastrointestinaI: No nausea, vomiting, diarrhea, abdominal pain, fecal incontinence Genitourinary:  No dysuria, urinary retention or frequency Musculoskeletal:  No neck pain, back pain Integumentary: No rash, pruritus, skin lesions Neurological: as above Psychiatric: No depression, insomnia, anxiety Endocrine: No palpitations, fatigue, diaphoresis, mood swings, change in appetite, change in weight, increased thirst Hematologic/Lymphatic:  No purpura, petechiae. Allergic/Immunologic: no itchy/runny  eyes, nasal congestion, recent allergic reactions, rashes  PHYSICAL EXAM: *** General: No acute distress.  Patient appears ***-groomed.  *** Head:  Normocephalic/atraumatic Eyes:  fundi examined but not visualized Neck: supple, no paraspinal tenderness, full range of motion Back: No paraspinal tenderness Heart: regular rate and rhythm Lungs: Clear to auscultation bilaterally. Vascular: No carotid bruits. Neurological Exam: Mental status: alert and oriented to person, place, and time, recent and remote memory intact, fund of knowledge intact, attention and concentration intact, speech fluent and not dysarthric, language intact. Cranial nerves: CN I: not tested CN II: pupils equal, round and reactive to light, visual fields intact CN III, IV, VI:  full range of motion, no nystagmus, no ptosis CN V: facial sensation intact CN VII: upper and lower face symmetric CN VIII: hearing intact CN IX, X: gag intact, uvula  midline CN XI: sternocleidomastoid and trapezius muscles intact CN XII: tongue midline Bulk & Tone: normal, no fasciculations. Motor:  5/5 throughout *** Sensation:  Pinprick *** temperature *** and vibration sensation intact.  ***. Deep Tendon Reflexes:  2+ throughout, *** toes downgoing.  *** Finger to nose testing:  Without dysmetria.  *** Heel to shin:  Without dysmetria.  *** Gait:  Normal station and stride.  Able to turn and tandem walk. Romberg ***.  IMPRESSION: ***  PLAN: ***  Thank you for allowing me to take part in the care of this patient.  Metta Clines, DO  CC: ***

## 2019-07-28 ENCOUNTER — Ambulatory Visit: Payer: 59 | Admitting: Neurology

## 2019-07-28 ENCOUNTER — Other Ambulatory Visit: Payer: Self-pay | Admitting: Family Medicine

## 2019-08-01 ENCOUNTER — Encounter: Payer: Self-pay | Admitting: Neurology

## 2019-08-01 ENCOUNTER — Other Ambulatory Visit: Payer: Self-pay

## 2019-08-01 ENCOUNTER — Ambulatory Visit: Payer: 59 | Admitting: Neurology

## 2019-08-01 VITALS — BP 163/102 | HR 60 | Ht 70.0 in | Wt 205.0 lb

## 2019-08-01 DIAGNOSIS — R42 Dizziness and giddiness: Secondary | ICD-10-CM

## 2019-08-01 DIAGNOSIS — M47812 Spondylosis without myelopathy or radiculopathy, cervical region: Secondary | ICD-10-CM

## 2019-08-01 DIAGNOSIS — I679 Cerebrovascular disease, unspecified: Secondary | ICD-10-CM

## 2019-08-01 DIAGNOSIS — I10 Essential (primary) hypertension: Secondary | ICD-10-CM | POA: Diagnosis not present

## 2019-08-01 NOTE — Progress Notes (Signed)
NEUROLOGY CONSULTATION NOTE  [name] MRN: [id number] DOB: September 20, 1960  Referring provider: [name] [name], MD Primary care provider: [name] [name], MD  Reason for consult:  dizziness  HISTORY OF PRESENT ILLNESS: [name] is a 59 year old right/ambidextrous-handed white male with HTN and CKD who presents for dizziness.  History supplemented by internal medicine and ENT notes.  Onset: 6 to 7 months When supine, spinning has to sit up and shake head. Shakes it off after a minute. Nausea no vomiting, no double vision,   Feel like going pass out  If walking down the hallway, head feels like in a tunnel and will stop after a minute.   Bending over and coming back slight.   10 seconds.  to [place] and then sleeps on side with head elevated.  ENT exam negative.  At worse 4 to 5 times a week.  Now 1 to 2 times a week.  Headache a month ago.  Neck stiffness.  Spondylosis.  [place] off roof as young      History of migraines but stopped 1.5 to 2 years ago.   Severe sweats, in bed, nausea, photophobia, phonophobia  Endocrinologist stops HCTZ 2 weeks ago.    Not as many spells but now blood pressure elevated.  4-5 years bp difficult to control.    Since last summer, he has felt dizziness.  No preceding trauma, illness or change in medication.  Sometimes when he is lying supine in bed, he will wake up with severe spinning sensation associated with nausea but no vomiting or double vision.  It is so severe that he feels like he may pass out, so he needs to sit up in bed and shake his head and it resolves in about a minute.  He would need to sleep on either side to prevent recurrence.  Other times, he may be walking when suddenly he feels like he is in a tunnel.  Sometimes when he bends over and comes back up, he feels slight wooziness which lasts about 10 seconds.  At worse, it was occurring 4 to 5 times a week.  About a month ago, his HCTZ was discontinued and it occurs 1 to 2 times a  week.  MRI of brain without contrast from 04/29/2019 personally reviewed demonstrated mild chronic small vessel ischemic changes and chronic lacunar infarcts in the right basal ganglia and left cerebellum but no acute intracranial abnormality.  Follow up carotid ultrasound on 05/24/2019 showed no hemodynamically significant stenosis in the bilateral carotid arteries; antegrade flow noted in the bilateral vertebral arteries.  He was evaluated by ENT in February.  The retention cysts were thought to be incidental and that the nasal obstruction was secondary to turbinate hypertrophy and was prescribed nasal steroids.  Dix-Hallpike was negative.    He has remote history of severe migraines but hasn't had a migraine in 1 1/2 to 2 years.  He had one dull occipital headache radiating into the neck about a month ago, but otherwise no headaches.  He has some neck stiffness with history of chronic neck pain since a fall off a roof when he was young.  Remote MRI of cervical spine from 04/25/1999 showed spondylosis with bi-foraminal narrowing due to uncinate spurring at C3-4, moderate disc degeneration at C5-6 and central disc herniation at T2-3.    PAST MEDICAL HISTORY: Past Medical History:  Diagnosis Date  . ADHD   . Chronic kidney disease   . GERD (gastroesophageal reflux disease)   .  Hypertension   . Migraine headache without aura     PAST SURGICAL HISTORY: Past Surgical History:  Procedure Laterality Date  . HERNIA REPAIR  2010   Bilateral inguinal    MEDICATIONS: Current Outpatient Medications on File Prior to Visit  Medication Sig Dispense Refill  . Ascorbic Acid (VITAMIN C) 1000 MG tablet Take by mouth.    Marland Kitchen aspirin 81 MG chewable tablet Chew 81 mg by mouth daily.    Marland Kitchen atorvastatin (LIPITOR) 20 MG tablet TAKE 1 TABLET BY MOUTH EVERY DAY 90 tablet 3  . AVAPRO 300 MG tablet Take 0.5 tablets (150 mg total) by mouth daily. 45 tablet 0  . FLONASE 50 MCG/ACT nasal spray Place 2 sprays into both  nostrils daily. 16 g 3  . Garlic 100 MG TABS Take by mouth.    . hydrochlorothiazide (HYDRODIURIL) 25 MG tablet Take 1 tablet (25 mg total) by mouth daily. 90 tablet 3  . HYDROcodone-acetaminophen (NORCO/VICODIN) 5-325 MG tablet Take 1-2 tablets by mouth every 6 (six) hours as needed for moderate pain. 14 tablet 0  . irbesartan (AVAPRO) 150 MG tablet Take 150 mg by mouth daily.    Marland Kitchen levothyroxine (SYNTHROID) 100 MCG tablet TAKE 1 TABLET BY MOUTH EVERY DAY 90 tablet 0  . meclizine (ANTIVERT) 25 MG tablet Take 1 tablet (25 mg total) by mouth 3 (three) times daily as needed for dizziness. 45 tablet 0  . Multiple Vitamin (MULTI-VITAMINS) TABS Take by mouth.    . NEXLETOL 180 MG TABS Take 1 tablet by mouth daily.    . Omega-3 1000 MG CAPS Take by mouth.    . ondansetron (ZOFRAN ODT) 4 MG disintegrating tablet Take 1 tablet (4 mg total) by mouth every 8 (eight) hours as needed. 10 tablet 1  . Vitamin D, Ergocalciferol, (DRISDOL) 50000 units CAPS capsule Take by mouth.     No current facility-administered medications on file prior to visit.    ALLERGIES: Allergies  Allergen Reactions  . Sulfa Antibiotics Other (See Comments) and Diarrhea    diarrhea  . Benazepril Hcl Swelling    Throat "closing up"  . Penicillins Other (See Comments)    Unknown    FAMILY HISTORY: Family History  Problem Relation Age of Onset  . Mental illness Mother   . Cancer Paternal Aunt        lung  . Cancer Paternal Grandmother        lung  . Diabetes Neg Hx    SOCIAL HISTORY: Social History   Socioeconomic History  . Marital status: Married    Spouse name: Not on file  . Number of children: Not on file  . Years of education: Not on file  . Highest education level: Not on file  Occupational History  . Not on file  Tobacco Use  . Smoking status: Never Smoker  . Smokeless tobacco: Never Used  Substance and Sexual Activity  . Alcohol use: Yes    Alcohol/week: 0.0 standard drinks    Comment:  occasionally  . Drug use: No  . Sexual activity: Yes  Other Topics Concern  . Not on file  Social History Narrative  . Not on file   Social Determinants of Health   Financial Resource Strain:   . Difficulty of Paying Living Expenses:   Food Insecurity:   . Worried About Programme researcher, broadcasting/film/video in the Last Year:   . Barista in the Last Year:   Transportation Needs:   . Lack  of Transportation (Medical):   Marland Kitchen Lack of Transportation (Non-Medical):   Physical Activity:   . Days of Exercise per Week:   . Minutes of Exercise per Session:   Stress:   . Feeling of Stress :   Social Connections:   . Frequency of Communication with Friends and Family:   . Frequency of Social Gatherings with Friends and Family:   . Attends Religious Services:   . Active Member of Clubs or Organizations:   . Attends Banker Meetings:   Marland Kitchen Marital Status:   Intimate Partner Violence:   . Fear of Current or Ex-Partner:   . Emotionally Abused:   Marland Kitchen Physically Abused:   . Sexually Abused:     PHYSICAL EXAM: Blood pressure (!) 163/102, pulse 60, height 5\' 10"  (1.778 m), weight 205 lb (93 kg), SpO2 98 %. General: No acute distress.  Patient appears well-groomed.  Head:  Normocephalic/atraumatic Eyes:  fundi examined but not visualized Neck: supple, no paraspinal tenderness, full range of motion Back: No paraspinal tenderness Heart: regular rate and rhythm Lungs: Clear to auscultation bilaterally. Vascular: No carotid bruits. Neurological Exam: Mental status: alert and oriented to person, place, and time, recent and remote memory intact, fund of knowledge intact, attention and concentration intact, speech fluent and not dysarthric, language intact. Cranial nerves: CN I: not tested CN II: pupils equal, round and reactive to light, visual fields intact CN III, IV, VI:  full range of motion, no nystagmus, no ptosis CN V: facial sensation intact CN VII: upper and lower face symmetric CN  VIII: hearing intact CN IX, X: gag intact, uvula midline CN XI: sternocleidomastoid and trapezius muscles intact CN XII: tongue midline Bulk & Tone: normal, no fasciculations. Motor:  5/5 throughout  Sensation:  Pinprick and vibration sensation intact.  Deep Tendon Reflexes:  2+ throughout, toes downgoing.   Finger to nose testing:  Without dysmetria.   Heel to shin:  Without dysmetria.   Gait:  Normal station and stride.  Able to turn and tandem walk. Romberg negative. Dix-Hallpike negative.  However, when he sat up, he felt dizzy for several seconds.  IMPRESSION: 1.  Vertigo/dizziness.   2.  Cerebrovascular disease with evidence of remote cerebellar infarct 3.  Cervical spondylosis 4.  Essential hypertension  The supine vertigo semiology seems consistent with benign paroxysmal positional vertigo.  However, it could not be elicited with Dix-Hallpike.  I think that the chronic cerebellar infarct is an incidental finding.  However, given the cerebrovascular disease, I would like to assess for vertebrobasilar insufficiency.  Given the history of cervical spondylosis, cervicogenic etiology is also possible. I do not think that his symptoms are related to migraine.  PLAN: 1.  We will check CTA of head and neck to evaluate the posterior intracranial and extracranial vascular system. 2.  We will check a cervical X-ray to assess Z[place]aga of arthritis/spondylosis 3.  Treat secondary stroke risk factors:  ASA 81mg  daily; optimize blood pressure control; optimize cholesterol control. 4.  Further recommendations pending results.  May consider physical therapy for neck and/or vestibular rehab.  Thank you for allowing me to take part in the care of this patient.  , DO  CC: [name] , MD

## 2019-08-01 NOTE — Patient Instructions (Addendum)
1.  I don't think that the old strokes on the MRI are the cause of your dizziness.  I want to look for any changes in the blood vessels in your head and neck as well as the arthritis in your neck which may be a cause.  Further recommendations pending results.   We will check CTA of head and neck  We will check X-ray of cervical spine    We have sent a referral to [facility] for your CTA of head and neck and they will call you directly to schedule your appointment. They are located at [address] [facility]. If you need to contact them directly please call [phone]. Xray will be done at same.

## 2019-08-17 ENCOUNTER — Ambulatory Visit
Admission: RE | Admit: 2019-08-17 | Discharge: 2019-08-17 | Disposition: A | Discharge status: Home / Self Care | Payer: 59 | Source: Ambulatory Visit | Attending: Neurology | Admitting: Neurology

## 2019-08-17 ENCOUNTER — Other Ambulatory Visit: Payer: Self-pay

## 2019-08-17 DIAGNOSIS — I679 Cerebrovascular disease, unspecified: Secondary | ICD-10-CM

## 2019-08-17 DIAGNOSIS — R42 Dizziness and giddiness: Secondary | ICD-10-CM

## 2019-08-17 DIAGNOSIS — I10 Essential (primary) hypertension: Secondary | ICD-10-CM

## 2019-08-17 DIAGNOSIS — M47812 Spondylosis without myelopathy or radiculopathy, cervical region: Secondary | ICD-10-CM

## 2019-08-17 DIAGNOSIS — M542 Cervicalgia: Secondary | ICD-10-CM | POA: Diagnosis not present

## 2019-08-17 DIAGNOSIS — I6523 Occlusion and stenosis of bilateral carotid arteries: Secondary | ICD-10-CM | POA: Diagnosis not present

## 2019-08-17 MED ORDER — IOPAMIDOL (ISOVUE-370) INJECTION 76%
75.0000 mL | Freq: Once | INTRAVENOUS | Status: AC | PRN
  Administered 2019-08-17: 75 mL via INTRAVENOUS

## 2019-08-21 ENCOUNTER — Telehealth: Payer: Self-pay | Admitting: Neurology

## 2019-08-21 NOTE — Telephone Encounter (Signed)
Patient states he had a CT scan done 5/13 and would like to know if Dr. Everlena Cooper has seen the results yet. He would like to know what they are. Please call.

## 2019-08-21 NOTE — Telephone Encounter (Signed)
Note from [facility] message to pt on 08/18/19 per Dr. Everlena Cooper . One of the small blood vessels in the brain show some narrowing, but none of the major blood vessels show narrowing or blockage. No change in management in regards to these findings (continue aspirin 81mg  daily, cholesterol and blood pressure management). The X-ray of the cervical spine again shows some arthritic changes. I still believe the dizziness is inner ear (even though testing did not elicit the dizziness) or related to arthritis in neck. My recommendation for treating dizziness would be referral to vestibular rehab. If he notes neck pain, we can refer for physical therapy for neck pain as well. Please contact the office if you would like to do this so we can place a referral for you. Have a great day.Thanks.   Please ask DR. Jaffe: Pt wanted to know what a Vestibular Rehab consist of?

## 2019-08-21 NOTE — Telephone Encounter (Signed)
Exercises using head and eye movements to try and lessen dizziness.

## 2019-08-22 ENCOUNTER — Other Ambulatory Visit: Payer: Self-pay

## 2019-08-22 ENCOUNTER — Other Ambulatory Visit: Payer: Self-pay | Admitting: Family Medicine

## 2019-08-22 DIAGNOSIS — R42 Dizziness and giddiness: Secondary | ICD-10-CM

## 2019-08-22 DIAGNOSIS — I10 Essential (primary) hypertension: Secondary | ICD-10-CM

## 2019-08-22 NOTE — Telephone Encounter (Signed)
Patient need to schedule an ov for more refills. 

## 2019-08-22 NOTE — Progress Notes (Signed)
Per Dr. Everlena Cooper and pt Vestibulaer Rehab added to pt chart.

## 2019-08-23 ENCOUNTER — Encounter: Payer: Self-pay | Admitting: *Deleted

## 2019-08-23 ENCOUNTER — Other Ambulatory Visit: Payer: Self-pay | Admitting: *Deleted

## 2019-08-23 ENCOUNTER — Telehealth: Payer: Self-pay | Admitting: Family Medicine

## 2019-08-23 DIAGNOSIS — I10 Essential (primary) hypertension: Secondary | ICD-10-CM

## 2019-08-23 MED ORDER — HYDROCHLOROTHIAZIDE 25 MG PO TABS: 25.0000 mg | ORAL_TABLET | Freq: Every day | ORAL | 3 refills | Status: DC

## 2019-08-23 NOTE — Telephone Encounter (Signed)
Rx sent to the pharmacy.

## 2019-08-23 NOTE — Telephone Encounter (Signed)
Pt is calling stating that he is out of Rx hydrochlorothiazide 25 MG  Pharm:  [facility] in [place], [place]

## 2019-08-24 DIAGNOSIS — Z23 Encounter for immunization: Secondary | ICD-10-CM | POA: Diagnosis not present

## 2019-08-31 DIAGNOSIS — R42 Dizziness and giddiness: Secondary | ICD-10-CM | POA: Diagnosis not present

## 2019-08-31 DIAGNOSIS — R262 Difficulty in walking, not elsewhere classified: Secondary | ICD-10-CM | POA: Diagnosis not present

## 2019-09-11 ENCOUNTER — Other Ambulatory Visit: Payer: Self-pay

## 2019-09-12 ENCOUNTER — Ambulatory Visit (INDEPENDENT_AMBULATORY_CARE_PROVIDER_SITE_OTHER): Payer: 59 | Admitting: Family Medicine

## 2019-09-12 ENCOUNTER — Encounter: Payer: Self-pay | Admitting: Family Medicine

## 2019-09-12 ENCOUNTER — Other Ambulatory Visit (INDEPENDENT_AMBULATORY_CARE_PROVIDER_SITE_OTHER): Payer: 59

## 2019-09-12 VITALS — BP 124/78 | HR 69 | Temp 97.2°F | Resp 16 | Ht 70.0 in | Wt 201.1 lb

## 2019-09-12 DIAGNOSIS — I10 Essential (primary) hypertension: Secondary | ICD-10-CM

## 2019-09-12 DIAGNOSIS — E559 Vitamin D deficiency, unspecified: Secondary | ICD-10-CM

## 2019-09-12 DIAGNOSIS — E785 Hyperlipidemia, unspecified: Secondary | ICD-10-CM

## 2019-09-12 DIAGNOSIS — E039 Hypothyroidism, unspecified: Secondary | ICD-10-CM

## 2019-09-12 DIAGNOSIS — R42 Dizziness and giddiness: Secondary | ICD-10-CM

## 2019-09-12 DIAGNOSIS — N1831 Chronic kidney disease, stage 3a: Secondary | ICD-10-CM | POA: Diagnosis not present

## 2019-09-12 LAB — COMPREHENSIVE METABOLIC PANEL
ALT: 19 U/L (ref 0–53)
AST: 21 U/L (ref 0–37)
Albumin: 4.5 g/dL (ref 3.5–5.2)
Alkaline Phosphatase: 45 U/L (ref 39–117)
BUN: 24 mg/dL — ABNORMAL HIGH (ref 6–23)
CO2: 29 mEq/L (ref 19–32)
Calcium: 9.6 mg/dL (ref 8.4–10.5)
Chloride: 102 mEq/L (ref 96–112)
Creatinine, Ser: 1.44 mg/dL (ref 0.40–1.50)
GFR: 49.95 mL/min — ABNORMAL LOW (ref >60.00)
Glucose, Bld: 90 mg/dL (ref 70–99)
Potassium: 3.9 mEq/L (ref 3.5–5.1)
Sodium: 136 mEq/L (ref 135–145)
Total Bilirubin: 0.8 mg/dL (ref 0.2–1.2)
Total Protein: 7 g/dL (ref 6.0–8.3)

## 2019-09-12 LAB — MICROALBUMIN / CREATININE URINE RATIO
Creatinine,U: 149.3 mg/dL
Microalb Creat Ratio: 0.5 mg/g (ref 0.0–30.0)
Microalb, Ur: <0.7 mg/dL (ref 0.0–1.9)

## 2019-09-12 LAB — LIPID PANEL
Cholesterol: 217 mg/dL — ABNORMAL HIGH (ref 0–200)
HDL: 40.7 mg/dL (ref >39.00)
LDL Cholesterol: 149 mg/dL — ABNORMAL HIGH (ref 0–99)
NonHDL: 176.55
Total CHOL/HDL Ratio: 5
Triglycerides: 138 mg/dL (ref 0.0–149.0)
VLDL: 27.6 mg/dL (ref 0.0–40.0)

## 2019-09-12 LAB — TSH: TSH: 1.56 u[IU]/mL (ref 0.35–4.50)

## 2019-09-12 LAB — VITAMIN D 25 HYDROXY (VIT D DEFICIENCY, FRACTURES): VITD: 60.17 ng/mL (ref 30.00–100.00)

## 2019-09-12 MED ORDER — IRBESARTAN 75 MG PO TABS: 75.0000 mg | ORAL_TABLET | Freq: Every day | ORAL | 2 refills | Status: DC

## 2019-09-12 NOTE — Progress Notes (Signed)
HPI:  [name] is a 59 y.o. male, who is here today for chronic disease management.  He was last seen on 04/14/2019. Since his last visit he has seen neurologist and ENT because of dizziness. ENT did not think it was vertigo and recommended neuro evaluation. Work-up has been otherwise negative. Thought dizziness could be cause by hypotension.  He noted some improvement about 50-60% when he decreased Avapro dose from 150 mg to 75 mg  (1/4 tab). He is eating much better and exercising regularly.  ENT recommended d/c HCTZ but he resumed it because BP went up.  Home BP readings:Low 110's/80's. Negative for severe/frequent headache, visual changes, chest pain, dyspnea, palpitation, focal weakness, or edema.  CKD III: Renal function has been stable. Negative for gross hematuria,foam in urine,or decreased urine output.  Lab Results  Component Value Date   CREATININE 1.41 02/02/2019   BUN 29 (H) 02/02/2019   NA 140 02/02/2019   K 3.8 02/02/2019   CL 104 02/02/2019   CO2 29 02/02/2019   Vit D deficiency: He is on "50 mg" daily.  HLD: Atorvastatin caused myalgias, so changed to  Nexletol 180 mg daily. He is also following low fat diet.  Lab Results  Component Value Date   CHOL 166 02/02/2019   HDL 36.00 (L) 02/02/2019   LDLCALC 107 (H) 02/02/2019   TRIG 112.0 02/02/2019   CHOLHDL 5 02/02/2019   Hypothyroidism:He is on Synthroid 100 mcg daily. Negative for tremor,cold/heat intolerance,or changes in bowel habits.  Lab Results  Component Value Date   TSH 3.01 04/11/2019   Review of Systems  Constitutional: Negative for activity change, appetite change, fatigue and fever.  HENT: Negative for mouth sores, nosebleeds and sore throat.   Respiratory: Negative for cough and wheezing.   Gastrointestinal: Negative for abdominal pain, nausea and vomiting.  Musculoskeletal: Negative for gait problem.  Skin: Negative for rash and wound.  Neurological: Positive  for dizziness. Negative for syncope, facial asymmetry and weakness.  Psychiatric/Behavioral: Negative for confusion.  Rest of ROS, see pertinent positives sand negatives in HPI  Current Outpatient Medications on File Prior to Visit  Medication Sig Dispense Refill  . Ascorbic Acid (VITAMIN C) 1000 MG tablet Take by mouth.    Marland Kitchen aspirin 81 MG chewable tablet Chew 81 mg by mouth daily.    Marland Kitchen FLONASE 50 MCG/ACT nasal spray Place 2 sprays into both nostrils daily. 16 g 3  . hydrochlorothiazide (HYDRODIURIL) 25 MG tablet Take 1 tablet (25 mg total) by mouth daily. 90 tablet 3  . levothyroxine (SYNTHROID) 100 MCG tablet TAKE 1 TABLET BY MOUTH EVERY DAY 90 tablet 0  . Multiple Vitamin (MULTI-VITAMINS) TABS Take by mouth.    . NEXLETOL 180 MG TABS Take 1 tablet by mouth daily.    . Omega-3 1000 MG CAPS Take by mouth.    . Vitamin D, Ergocalciferol, (DRISDOL) 50000 units CAPS capsule Take by mouth.     No current facility-administered medications on file prior to visit.   Past Medical History:  Diagnosis Date  . ADHD   . Chronic kidney disease   . GERD (gastroesophageal reflux disease)   . Hypertension   . Migraine headache without aura    Allergies  Allergen Reactions  . Benazepril Hcl Swelling    Throat "closing up"  . Sulfa Antibiotics Other (See Comments) and Diarrhea    diarrhea  . Penicillins Other (See Comments)    Unknown    Social History  Socioeconomic History  . Marital status: Married    Spouse name: Not on file  . Number of children: 3  . Years of education: 40  . Highest education level: Not on file  Occupational History  . Not on file  Tobacco Use  . Smoking status: Never Smoker  . Smokeless tobacco: Never Used  Substance and Sexual Activity  . Alcohol use: Yes    Alcohol/week: 0.0 standard drinks    Comment: occasionally  . Drug use: No  . Sexual activity: Yes  Other Topics Concern  . Not on file  Social History Narrative   Right handed   Two story home    No caffeine   Social Determinants of Health   Financial Resource Strain:   . Difficulty of Paying Living Expenses:   Food Insecurity:   . Worried About Charity fundraiser in the Last Year:   . Arboriculturist in the Last Year:   Transportation Needs:   . Film/video editor (Medical):   Marland Kitchen Lack of Transportation (Non-Medical):   Physical Activity:   . Days of Exercise per Week:   . Minutes of Exercise per Session:   Stress:   . Feeling of Stress :   Social Connections:   . Frequency of Communication with Friends and Family:   . Frequency of Social Gatherings with Friends and Family:   . Attends Religious Services:   . Active Member of Clubs or Organizations:   . Attends Archivist Meetings:   Marland Kitchen Marital Status:     Vitals:   09/12/19 1026  BP: 124/78  Pulse: 69  Resp: 16  Temp: (!) 97.2 F (36.2 C)  SpO2: 97%   Body mass index is 28.86 kg/m.  Physical Exam  Nursing note reviewed. Constitutional: He is oriented to person, place, and time. He appears well-developed. No distress.  HENT:  Head: Normocephalic and atraumatic.  Mouth/Throat: Oropharynx is clear and moist and mucous membranes are normal.  Eyes: Pupils are equal, round, and reactive to light. Conjunctivae are normal.  Cardiovascular: Normal rate and regular rhythm.  No murmur heard. Pulses:      Dorsalis pedis pulses are 2+ on the right side and 2+ on the left side.  Respiratory: Effort normal and breath sounds normal. No respiratory distress.  GI: Soft. He exhibits no mass. There is no hepatomegaly. There is no abdominal tenderness.  Musculoskeletal:        General: No edema.  Lymphadenopathy:    He has no cervical adenopathy.  Neurological: He is alert and oriented to person, place, and time. He has normal strength. No cranial nerve deficit. Gait normal.  Skin: Skin is warm. No rash noted. No erythema.  Psychiatric: His mood appears anxious. Cognition and memory are normal.  Well  groomed, good eye contact.   ASSESSMENT AND PLAN:  [name] was seen today for chronic disease management..  Orders Placed This Encounter  Procedures  . VITAMIN D 25 Hydroxy (Vit-D Deficiency, Fractures)  . Comprehensive metabolic panel  . Lipid panel  . TSH  . Microalbumin / creatinine urine ratio   Lab Results  Component Value Date   MICROALBUR <0.7 09/12/2019   Lab Results  Component Value Date   TSH 1.56 09/12/2019   Lab Results  Component Value Date   CREATININE 1.44 09/12/2019   BUN 24 (H) 09/12/2019   NA 136 09/12/2019   K 3.9 09/12/2019   CL 102 09/12/2019   CO2  29 09/12/2019   Lab Results  Component Value Date   CHOL 217 (H) 09/12/2019   HDL 40.70 09/12/2019   LDLCALC 149 (H) 09/12/2019   TRIG 138.0 09/12/2019   CHOLHDL 5 09/12/2019   Lab Results  Component Value Date   ALT 19 09/12/2019   AST 21 09/12/2019   ALKPHOS 45 09/12/2019   BILITOT 0.8 09/12/2019   The 10-year ASCVD risk score Denman George DC Jr., et al., 2013) is: 11.8%   Values used to calculate the score:     Age: 50 years     Sex: Male     Is Non-Hispanic African American: No     Diabetic: No     Tobacco smoker: No     Systolic Blood Pressure: 124 mmHg     Is BP treated: Yes     HDL Cholesterol: 40.7 mg/dL     Total Cholesterol: 217 mg/dL  1. Essential hypertension BP running on lower ranges. Avapro decreased from 75 mg to 37.5 mg daily. No changes in HCTZ. Continue monitoring BP. F/U in 4 months.  2. Stage 3a chronic kidney disease Problem has been stable overall. Continue Avapro and low salt diet. Continue avoiding NSAIDs. Adequate BP control. Further recommendation will be given according to BMP result.  3. Vitamin D deficiency Further recommendations will be given according to 25 OH vit D.  4. Hypothyroidism (acquired) Continue levothyroxine 100 mcg daily. Further recommendation will be given according to lab results.  5. Hyperlipidemia, unspecified  hyperlipidemia type Did not tolerate Atorvastatin. Continue Nexletol 180 mg daily and low fat diet. Further recommendation will be given according to lipid panel results.  6. Dizziness Work-up otherwise negative. Improved with decreasing dose of Avapro. Fall precautions discussed.  Return in about 4 months (around 01/12/2020).  Laura Caldas G. [place], MD  [facility]. [place] office.  A few things to remember from today's visit:  Today Avapro 75 mg sent. You can decrease dose if blood pressure is low. No changes in rest of meds.  If you need refills please call your pharmacy. Do not use My Chart to request refills or for acute issues that need immediate attention.    Please be sure medication list is accurate. If a new problem present, please set up appointment sooner than planned today.

## 2019-09-12 NOTE — Patient Instructions (Addendum)
A few things to remember from today's visit:   Essential hypertension - Plan: Comprehensive metabolic panel  Stage 3a chronic kidney disease - Plan: Comprehensive metabolic panel, Microalbumin / creatinine urine ratio  Vitamin D deficiency - Plan: VITAMIN D 25 Hydroxy (Vit-D Deficiency, Fractures)  Hypothyroidism (acquired) - Plan: TSH  Hyperlipidemia, unspecified hyperlipidemia type - Plan: Comprehensive metabolic panel, Lipid panel  Dizziness   Today Avapro 75 mg sent. You can decrease dose if blood pressure is low. No changes in rest of meds.  If you need refills please call your pharmacy. Do not use My Chart to request refills or for acute issues that need immediate attention.    Please be sure medication list is accurate. If a new problem present, please set up appointment sooner than planned today.

## 2019-09-15 MED ORDER — LEVOTHYROXINE SODIUM 100 MCG PO TABS: 100.0000 ug | ORAL_TABLET | Freq: Every day | ORAL | 3 refills | Status: DC

## 2019-09-21 DIAGNOSIS — Z23 Encounter for immunization: Secondary | ICD-10-CM | POA: Diagnosis not present

## 2019-11-14 DIAGNOSIS — R69 Illness, unspecified: Secondary | ICD-10-CM | POA: Diagnosis not present

## 2019-11-14 DIAGNOSIS — E785 Hyperlipidemia, unspecified: Secondary | ICD-10-CM | POA: Diagnosis not present

## 2019-11-14 DIAGNOSIS — H903 Sensorineural hearing loss, bilateral: Secondary | ICD-10-CM | POA: Diagnosis not present

## 2019-11-14 DIAGNOSIS — I700 Atherosclerosis of aorta: Secondary | ICD-10-CM | POA: Diagnosis not present

## 2019-11-14 DIAGNOSIS — N401 Enlarged prostate with lower urinary tract symptoms: Secondary | ICD-10-CM | POA: Diagnosis not present

## 2019-11-14 DIAGNOSIS — E039 Hypothyroidism, unspecified: Secondary | ICD-10-CM | POA: Diagnosis not present

## 2019-11-14 DIAGNOSIS — I6350 Cerebral infarction due to unspecified occlusion or stenosis of unspecified cerebral artery: Secondary | ICD-10-CM | POA: Diagnosis not present

## 2019-11-14 DIAGNOSIS — I10 Essential (primary) hypertension: Secondary | ICD-10-CM | POA: Diagnosis not present

## 2019-11-14 DIAGNOSIS — N1831 Chronic kidney disease, stage 3a: Secondary | ICD-10-CM | POA: Diagnosis not present

## 2019-11-14 DIAGNOSIS — E559 Vitamin D deficiency, unspecified: Secondary | ICD-10-CM | POA: Diagnosis not present

## 2019-11-21 DIAGNOSIS — M25511 Pain in right shoulder: Secondary | ICD-10-CM | POA: Diagnosis not present

## 2019-11-21 DIAGNOSIS — M67911 Unspecified disorder of synovium and tendon, right shoulder: Secondary | ICD-10-CM | POA: Diagnosis not present

## 2019-11-21 DIAGNOSIS — M67912 Unspecified disorder of synovium and tendon, left shoulder: Secondary | ICD-10-CM | POA: Diagnosis not present

## 2019-11-21 DIAGNOSIS — M25512 Pain in left shoulder: Secondary | ICD-10-CM | POA: Diagnosis not present

## 2019-11-22 ENCOUNTER — Other Ambulatory Visit: Payer: Self-pay | Admitting: Family Medicine

## 2019-11-22 DIAGNOSIS — M25512 Pain in left shoulder: Secondary | ICD-10-CM

## 2019-12-05 ENCOUNTER — Other Ambulatory Visit: Payer: Self-pay | Admitting: Family Medicine

## 2019-12-19 ENCOUNTER — Other Ambulatory Visit: Payer: Self-pay

## 2019-12-19 ENCOUNTER — Ambulatory Visit
Admission: RE | Admit: 2019-12-19 | Discharge: 2019-12-19 | Disposition: A | Discharge status: Home / Self Care | Payer: 59 | Source: Ambulatory Visit | Attending: Family Medicine | Admitting: Family Medicine

## 2019-12-19 DIAGNOSIS — M19012 Primary osteoarthritis, left shoulder: Secondary | ICD-10-CM | POA: Diagnosis not present

## 2019-12-19 DIAGNOSIS — M25512 Pain in left shoulder: Secondary | ICD-10-CM

## 2019-12-19 DIAGNOSIS — S46012A Strain of muscle(s) and tendon(s) of the rotator cuff of left shoulder, initial encounter: Secondary | ICD-10-CM | POA: Diagnosis not present

## 2019-12-19 DIAGNOSIS — R531 Weakness: Secondary | ICD-10-CM | POA: Diagnosis not present

## 2019-12-21 DIAGNOSIS — M67912 Unspecified disorder of synovium and tendon, left shoulder: Secondary | ICD-10-CM | POA: Diagnosis not present

## 2020-01-10 DIAGNOSIS — Z20828 Contact with and (suspected) exposure to other viral communicable diseases: Secondary | ICD-10-CM | POA: Diagnosis not present

## 2020-01-31 DIAGNOSIS — M25512 Pain in left shoulder: Secondary | ICD-10-CM | POA: Diagnosis not present

## 2020-01-31 DIAGNOSIS — M67912 Unspecified disorder of synovium and tendon, left shoulder: Secondary | ICD-10-CM | POA: Diagnosis not present

## 2020-02-07 ENCOUNTER — Other Ambulatory Visit: Payer: Self-pay | Admitting: Orthopaedic Surgery

## 2020-02-28 NOTE — Patient Instructions (Addendum)
DUE TO COVID-19 ONLY ONE VISITOR IS ALLOWED TO COME WITH YOU AND STAY IN THE WAITING ROOM ONLY DURING PRE OP AND PROCEDURE.   IF YOU WILL BE ADMITTED INTO THE HOSPITAL YOU ARE ALLOWED ONE SUPPORT PERSON DURING VISITATION HOURS ONLY (10AM -8PM)   . The support person may change daily. . The support person must pass our screening, gel in and out, and wear a mask at all times, including in the patient's room. . Patients must also wear a mask when staff or their support person are in the room.   COVID SWAB TESTING MUST BE COMPLETED ON:  Friday, 03-08-20 @ 10:05 AM   [address] [place], [place] [address]  (Must self quarantine after testing. Follow instructions on handout.)        Your procedure is scheduled on:  Tuesday, 03-12-20   Report to [facility] Main  Entrance    Report to admitting at 1:20 PM   Call this number if you have problems the morning of surgery [phone]   Do not eat food :After Midnight.   May have liquids until 12:15 PM  day of surgery  CLEAR LIQUID DIET  Foods Allowed                                                                     Foods Excluded  Water, Black Coffee and tea, regular and decaf             liquids that you cannot  Plain Jell-O in any flavor  (No red)                                    see through such as: Fruit ices (not with fruit pulp)                                      milk, soups, orange juice              Iced Popsicles (No red)                                      All solid food                                   Apple juices Sports drinks like Gatorade (No red) Lightly seasoned clear broth or consume(fat free) Sugar, honey syrup     Complete one Ensure drink the morning of surgery at 12:15 PM the day of surgery.    Oral Hygiene is also important to reduce your risk of infection.                                    Remember - BRUSH YOUR TEETH THE MORNING OF SURGERY WITH YOUR REGULAR TOOTHPASTE   Do NOT smoke after  Midnight   Take these medicines the morning  of surgery with A SIP OF WATER:  Synthroid                                You may not have any metal on your body including jewelry, and body piercings             Do not wear make-up, lotions, powders, perfumes/cologne, or deodorant             Men may shave face and neck.   Do not bring valuables to the hospital. [facility] IS NOT RESPONSIBLE   FOR VALUABLES.   Contacts, dentures or bridgework may not be worn into surgery.     Patients discharged the day of surgery will not be allowed to drive home.               Please read over the following fact sheets you were given: IF YOU HAVE QUESTIONS ABOUT YOUR PRE OP INSTRUCTIONS PLEASE CALL [phone]   [facility] - Preparing for Surgery Before surgery, you can play an important role.  Because skin is not sterile, your skin needs to be as free of germs as possible.  You can reduce the number of germs on your skin by washing with CHG (chlorahexidine gluconate) soap before surgery.  CHG is an antiseptic cleaner which kills germs and bonds with the skin to continue killing germs even after washing. Please DO NOT use if you have an allergy to CHG or antibacterial soaps.  If your skin becomes reddened/irritated stop using the CHG and inform your nurse when you arrive at Short Stay. Do not shave (including legs and underarms) for at least 48 hours prior to the first CHG shower.  You may shave your face/neck.  Please follow these instructions carefully:  1.  Shower with CHG Soap the night before surgery and the  morning of surgery.  2.  If you choose to wash your hair, wash your hair first as usual with your normal  shampoo.  3.  After you shampoo, rinse your hair and body thoroughly to remove the shampoo.                             4.  Use CHG as you would any other liquid soap.  You can apply chg directly to the skin and wash.  Gently with a scrungie or clean washcloth.  5.  Apply the CHG Soap to  your body ONLY FROM THE NECK DOWN.   Do   not use on face/ open                           Wound or open sores. Avoid contact with eyes, ears mouth and   genitals (private parts).                       Wash face,  Genitals (private parts) with your normal soap.             6.  Wash thoroughly, paying special attention to the area where your    surgery  will be performed.  7.  Thoroughly rinse your body with warm water from the neck down.  8.  DO NOT shower/wash with your normal soap after using and rinsing off the CHG Soap.  9.  Pat yourself dry with a clean towel.            10.  Wear clean pajamas.            11.  Place clean sheets on your bed the night of your first shower and do not  sleep with pets. Day of Surgery : Do not apply any lotions/deodorants the morning of surgery.  Please wear clean clothes to the hospital/surgery center.  FAILURE TO FOLLOW THESE INSTRUCTIONS MAY RESULT IN THE CANCELLATION OF YOUR SURGERY  PATIENT SIGNATURE_________________________________  NURSE SIGNATURE__________________________________  ________________________________________________________________________   Adam Phenix  An incentive spirometer is a tool that can help keep your lungs clear and active. This tool measures how well you are filling your lungs with each breath. Taking long deep breaths may help reverse or decrease the chance of developing breathing (pulmonary) problems (especially infection) following:  A long period of time when you are unable to move or be active. BEFORE THE PROCEDURE   If the spirometer includes an indicator to show your best effort, your nurse or respiratory therapist will set it to a desired goal.  If possible, sit up straight or lean slightly forward. Try not to slouch.  Hold the incentive spirometer in an upright position. INSTRUCTIONS FOR USE  1. Sit on the edge of your bed if possible, or sit up as far as you can in bed or on a  chair. 2. Hold the incentive spirometer in an upright position. 3. Breathe out normally. 4. Place the mouthpiece in your mouth and seal your lips tightly around it. 5. Breathe in slowly and as deeply as possible, raising the piston or the ball toward the top of the column. 6. Hold your breath for 3-5 seconds or for as long as possible. Allow the piston or ball to fall to the bottom of the column. 7. Remove the mouthpiece from your mouth and breathe out normally. 8. Rest for a few seconds and repeat Steps 1 through 7 at least 10 times every 1-2 hours when you are awake. Take your time and take a few normal breaths between deep breaths. 9. The spirometer may include an indicator to show your best effort. Use the indicator as a goal to work toward during each repetition. 10. After each set of 10 deep breaths, practice coughing to be sure your lungs are clear. If you have an incision (the cut made at the time of surgery), support your incision when coughing by placing a pillow or rolled up towels firmly against it. Once you are able to get out of bed, walk around indoors and cough well. You may stop using the incentive spirometer when instructed by your caregiver.  RISKS AND COMPLICATIONS  Take your time so you do not get dizzy or light-headed.  If you are in pain, you may need to take or ask for pain medication before doing incentive spirometry. It is harder to take a deep breath if you are having pain. AFTER USE  Rest and breathe slowly and easily.  It can be helpful to keep track of a log of your progress. Your caregiver can provide you with a simple table to help with this. If you are using the spirometer at home, follow these instructions: [place] IF:   You are having difficultly using the spirometer.  You have trouble using the spirometer as often as instructed.  Your pain medication is not giving enough relief while using the spirometer.  You  develop fever of 100.5 F  (38.1 C) or higher. SEEK IMMEDIATE MEDICAL CARE IF:   You cough up bloody sputum that had not been present before.  You develop fever of 102 F (38.9 C) or greater.  You develop worsening pain at or near the incision site. MAKE SURE YOU:   Understand these instructions.  Will watch your condition.  Will get help right away if you are not doing well or get worse. Document Released: 08/03/2006 Document Revised: 06/15/2011 Document Reviewed: 10/04/2006 [facility] Patient Information 2014 [place], [place].   ________________________________________________________________________

## 2020-02-28 NOTE — Progress Notes (Addendum)
COVID Vaccine Completed:  x2 Date COVID Vaccine completed:  08-24-19 & 09-21-19 COVID vaccine manufacturer: Pfizer    Moderna   [employer]   PCP - [name] Swaziland, MD Cardiologist -   Chest x-ray -  EKG - 03-04-20 in Epic Stress Test -  ECHO - 12-24-15 in Epic Cardiac Cath -  Pacemaker/ICD device last checked:  Sleep Study -  CPAP -   Fasting Blood Sugar -  Checks Blood Sugar _____ times a day  Blood Thinner Instructions: Aspirin Instructions:  ASA 891 mg Last Dose:  Anesthesia review:   Patient denies shortness of breath, fever, cough and chest pain at PAT appointment.  Patient able to climb a flight of stairs and perform ADL's without assistance.   Patient verbalized understanding of instructions that were given to them at the PAT appointment. Patient was also instructed that they will need to review over the PAT instructions again at home before surgery.

## 2020-03-04 ENCOUNTER — Other Ambulatory Visit: Payer: Self-pay

## 2020-03-04 ENCOUNTER — Encounter (HOSPITAL_COMMUNITY): Payer: Self-pay

## 2020-03-04 ENCOUNTER — Encounter (HOSPITAL_COMMUNITY)
Admission: RE | Admit: 2020-03-04 | Discharge: 2020-03-04 | Disposition: A | Discharge status: Home / Self Care | Payer: 59 | Source: Ambulatory Visit | Attending: Orthopaedic Surgery | Admitting: Orthopaedic Surgery

## 2020-03-04 DIAGNOSIS — Z01818 Encounter for other preprocedural examination: Secondary | ICD-10-CM | POA: Diagnosis not present

## 2020-03-04 DIAGNOSIS — I10 Essential (primary) hypertension: Secondary | ICD-10-CM | POA: Diagnosis not present

## 2020-03-04 HISTORY — DX: Personal history of urinary calculi: Z87.442

## 2020-03-04 HISTORY — DX: Hypothyroidism, unspecified: E03.9

## 2020-03-04 HISTORY — DX: Unspecified osteoarthritis, unspecified site: M19.90

## 2020-03-04 LAB — BASIC METABOLIC PANEL
Anion gap: 8 (ref 5–15)
BUN: 27 mg/dL — ABNORMAL HIGH (ref 8–23)
CO2: 28 mmol/L (ref 22–32)
Calcium: 9.7 mg/dL (ref 8.9–10.3)
Chloride: 106 mmol/L (ref 98–111)
Creatinine, Ser: 1.47 mg/dL — ABNORMAL HIGH (ref 0.61–1.24)
GFR, Estimated: 54 mL/min — ABNORMAL LOW (ref >60)
Glucose, Bld: 93 mg/dL (ref 70–99)
Potassium: 4.5 mmol/L (ref 3.5–5.1)
Sodium: 142 mmol/L (ref 135–145)

## 2020-03-04 LAB — CBC
HCT: 47.1 % (ref 39.0–52.0)
Hemoglobin: 15.8 g/dL (ref 13.0–17.0)
MCH: 30.8 pg (ref 26.0–34.0)
MCHC: 33.5 g/dL (ref 30.0–36.0)
MCV: 91.8 fL (ref 80.0–100.0)
Platelets: 267 10*3/uL (ref 150–400)
RBC: 5.13 MIL/uL (ref 4.22–5.81)
RDW: 12.4 % (ref 11.5–15.5)
WBC: 5.1 10*3/uL (ref 4.0–10.5)
nRBC: 0 % (ref 0.0–0.2)

## 2020-03-08 ENCOUNTER — Other Ambulatory Visit (HOSPITAL_COMMUNITY)
Admission: RE | Admit: 2020-03-08 | Discharge: 2020-03-08 | Disposition: A | Discharge status: Home / Self Care | Payer: 59 | Source: Ambulatory Visit | Attending: Orthopaedic Surgery | Admitting: Orthopaedic Surgery

## 2020-03-08 DIAGNOSIS — Z20822 Contact with and (suspected) exposure to covid-19: Secondary | ICD-10-CM | POA: Insufficient documentation

## 2020-03-08 DIAGNOSIS — Z01812 Encounter for preprocedural laboratory examination: Secondary | ICD-10-CM | POA: Insufficient documentation

## 2020-03-08 LAB — SARS CORONAVIRUS 2 (TAT 6-24 HRS): SARS Coronavirus 2: NEGATIVE

## 2020-03-11 ENCOUNTER — Emergency Department (HOSPITAL_BASED_OUTPATIENT_CLINIC_OR_DEPARTMENT_OTHER): Payer: 59

## 2020-03-11 ENCOUNTER — Encounter (HOSPITAL_BASED_OUTPATIENT_CLINIC_OR_DEPARTMENT_OTHER): Payer: Self-pay | Admitting: *Deleted

## 2020-03-11 ENCOUNTER — Emergency Department (HOSPITAL_BASED_OUTPATIENT_CLINIC_OR_DEPARTMENT_OTHER)
Admission: EM | Admit: 2020-03-11 | Discharge: 2020-03-12 | Disposition: A | Discharge status: Home / Self Care | Payer: 59 | Attending: Emergency Medicine | Admitting: Emergency Medicine

## 2020-03-11 ENCOUNTER — Other Ambulatory Visit: Payer: Self-pay

## 2020-03-11 DIAGNOSIS — N183 Chronic kidney disease, stage 3 unspecified: Secondary | ICD-10-CM | POA: Insufficient documentation

## 2020-03-11 DIAGNOSIS — N201 Calculus of ureter: Secondary | ICD-10-CM | POA: Diagnosis not present

## 2020-03-11 DIAGNOSIS — I129 Hypertensive chronic kidney disease with stage 1 through stage 4 chronic kidney disease, or unspecified chronic kidney disease: Secondary | ICD-10-CM | POA: Diagnosis not present

## 2020-03-11 DIAGNOSIS — E039 Hypothyroidism, unspecified: Secondary | ICD-10-CM | POA: Diagnosis not present

## 2020-03-11 DIAGNOSIS — N200 Calculus of kidney: Secondary | ICD-10-CM | POA: Diagnosis not present

## 2020-03-11 DIAGNOSIS — R109 Unspecified abdominal pain: Secondary | ICD-10-CM

## 2020-03-11 DIAGNOSIS — Z79899 Other long term (current) drug therapy: Secondary | ICD-10-CM | POA: Insufficient documentation

## 2020-03-11 DIAGNOSIS — K219 Gastro-esophageal reflux disease without esophagitis: Secondary | ICD-10-CM | POA: Diagnosis not present

## 2020-03-11 DIAGNOSIS — K4090 Unilateral inguinal hernia, without obstruction or gangrene, not specified as recurrent: Secondary | ICD-10-CM | POA: Diagnosis not present

## 2020-03-11 DIAGNOSIS — R1032 Left lower quadrant pain: Secondary | ICD-10-CM | POA: Diagnosis not present

## 2020-03-11 DIAGNOSIS — Z7982 Long term (current) use of aspirin: Secondary | ICD-10-CM | POA: Diagnosis not present

## 2020-03-11 LAB — CBC WITH DIFFERENTIAL/PLATELET
Abs Immature Granulocytes: 0.08 10*3/uL — ABNORMAL HIGH (ref 0.00–0.07)
Basophils Absolute: 0.1 10*3/uL (ref 0.0–0.1)
Basophils Relative: 1 %
Eosinophils Absolute: 0.1 10*3/uL (ref 0.0–0.5)
Eosinophils Relative: 1 %
HCT: 45.7 % (ref 39.0–52.0)
Hemoglobin: 15.8 g/dL (ref 13.0–17.0)
Immature Granulocytes: 1 %
Lymphocytes Relative: 13 %
Lymphs Abs: 1.3 10*3/uL (ref 0.7–4.0)
MCH: 31.1 pg (ref 26.0–34.0)
MCHC: 34.6 g/dL (ref 30.0–36.0)
MCV: 90 fL (ref 80.0–100.0)
Monocytes Absolute: 0.6 10*3/uL (ref 0.1–1.0)
Monocytes Relative: 7 %
Neutro Abs: 7.3 10*3/uL (ref 1.7–7.7)
Neutrophils Relative %: 77 %
Platelets: 265 10*3/uL (ref 150–400)
RBC: 5.08 MIL/uL (ref 4.22–5.81)
RDW: 12.2 % (ref 11.5–15.5)
WBC: 9.4 10*3/uL (ref 4.0–10.5)
nRBC: 0 % (ref 0.0–0.2)

## 2020-03-11 LAB — BASIC METABOLIC PANEL
Anion gap: 11 (ref 5–15)
BUN: 34 mg/dL — ABNORMAL HIGH (ref 8–23)
CO2: 24 mmol/L (ref 22–32)
Calcium: 9.5 mg/dL (ref 8.9–10.3)
Chloride: 103 mmol/L (ref 98–111)
Creatinine, Ser: 1.95 mg/dL — ABNORMAL HIGH (ref 0.61–1.24)
GFR, Estimated: 38 mL/min — ABNORMAL LOW (ref >60)
Glucose, Bld: 114 mg/dL — ABNORMAL HIGH (ref 70–99)
Potassium: 3.4 mmol/L — ABNORMAL LOW (ref 3.5–5.1)
Sodium: 138 mmol/L (ref 135–145)

## 2020-03-11 MED ORDER — MORPHINE SULFATE (PF) 4 MG/ML IV SOLN
4.0000 mg | Freq: Once | INTRAVENOUS | Status: AC
  Administered 2020-03-11: 4 mg via INTRAVENOUS
  Filled 2020-03-11: qty 1

## 2020-03-11 MED ORDER — OXYCODONE-ACETAMINOPHEN 5-325 MG PO TABS
ORAL_TABLET | ORAL | Status: AC
  Administered 2020-03-11: 1 via ORAL
  Filled 2020-03-11: qty 1

## 2020-03-11 MED ORDER — ONDANSETRON 4 MG PO TBDP: 4.0000 mg | ORAL_TABLET | Freq: Once | ORAL | Status: AC

## 2020-03-11 MED ORDER — OXYCODONE-ACETAMINOPHEN 5-325 MG PO TABS: 1.0000 | ORAL_TABLET | Freq: Once | ORAL | Status: AC

## 2020-03-11 MED ORDER — SODIUM CHLORIDE 0.9 % IV BOLUS
1000.0000 mL | Freq: Once | INTRAVENOUS | Status: AC
  Administered 2020-03-11: 1000 mL via INTRAVENOUS

## 2020-03-11 MED ORDER — ONDANSETRON 4 MG PO TBDP
ORAL_TABLET | ORAL | Status: AC
  Administered 2020-03-11: 4 mg via ORAL
  Filled 2020-03-11: qty 1

## 2020-03-11 MED ORDER — LIDOCAINE HCL URETHRAL/MUCOSAL 2 % EX GEL
CUTANEOUS | Status: AC
  Filled 2020-03-11: qty 11

## 2020-03-11 MED ORDER — KETOROLAC TROMETHAMINE 30 MG/ML IJ SOLN
15.0000 mg | Freq: Once | INTRAMUSCULAR | Status: AC
  Administered 2020-03-11: 15 mg via INTRAVENOUS
  Filled 2020-03-11: qty 1

## 2020-03-11 NOTE — ED Notes (Signed)
Provider in to evaluate. Unable to advance catheter. Provider aware.

## 2020-03-11 NOTE — ED Notes (Signed)
ED Provider at bedside. 

## 2020-03-11 NOTE — ED Triage Notes (Signed)
C/o left flank pain x 1 hr  °

## 2020-03-11 NOTE — ED Provider Notes (Signed)
[facility] EMERGENCY DEPARTMENT Provider Note   CSN: [id number] Arrival date & time: 03/11/20  1754     History Chief Complaint  Patient presents with  . Flank Pain    left    [name] is a 59 y.o. male with PMhx HTN, GERD, history of kidney stones who presents to the ED today with complaint of sudden onset, constant, sharp, worsening, L flank pain that began around 5 PM today.  Also complains of nausea and dry heaving.  Patient reports history of kidney stones and states this feels similar.  He has had kidney stones twice in 2019 and passed them both times.  He does mention that he has been unable to urinate since this morning despite drinking fluids earlier in the day.  Not taking anything for pain prior to arrival.  Patient denies fevers, chills, dysuria, urinary frequency, hematuria, any other associated symptoms.  The history is provided by the patient, the spouse and medical records.       Past Medical History:  Diagnosis Date  . ADHD   . Arthritis   . Chronic kidney disease   . GERD (gastroesophageal reflux disease)   . History of kidney stones   . Hypertension   . Hypothyroidism   . Migraine headache without aura     Patient Active Problem List   Diagnosis Date Noted  . Allergic rhinitis 03/09/2018  . Rectal bleed 08/24/2017  . Renal cyst 06/18/2017  . Back pain 06/18/2017  . Nodule of middle lobe of right lung 06/17/2017  . Nephrolithiasis 06/17/2017  . Hypothyroidism (acquired) 12/24/2016  . CKD (chronic kidney disease), stage III (HCC) 01/03/2016  . Hyperlipidemia 01/03/2016  . Vitamin D deficiency 01/03/2016  . Essential hypertension 09/03/2015  . Headache, migraine 09/03/2015  . Attention deficit hyperactivity disorder (ADHD) 09/03/2015    Past Surgical History:  Procedure Laterality Date  . COLONOSCOPY    . HERNIA REPAIR  2010   Bilateral inguinal  . KNEE ARTHROSCOPY    . TONSILLECTOMY    . WISDOM TOOTH EXTRACTION          Family History  Problem Relation Age of Onset  . Mental illness Mother   . Cancer Paternal Aunt        lung  . Cancer Paternal Grandmother        lung  . Diabetes Neg Hx     Social History   Tobacco Use  . Smoking status: Never Smoker  . Smokeless tobacco: Never Used  Vaping Use  . Vaping Use: Never used  Substance Use Topics  . Alcohol use: Yes    Alcohol/week: 0.0 standard drinks    Comment: occasionally  . Drug use: No    Home Medications Prior to Admission medications   Medication Sig Start Date End Date Taking? Authorizing Provider  Ascorbic Acid (VITAMIN C) 1000 MG tablet Take 1,000 mg by mouth daily.     [provider]  aspirin 81 MG EC tablet Take 81 mg by mouth daily.     [provider]  Cholecalciferol (VITAMIN D) 50 MCG (2000 UT) CAPS Take 2,000 Units by mouth daily.    [provider]  FLONASE 50 MCG/ACT nasal spray Place 2 sprays into both nostrils daily. Patient not taking: Reported on 02/26/2020 03/09/18   [place], [name] G, MD  hydrochlorothiazide (HYDRODIURIL) 25 MG tablet Take 1 tablet (25 mg total) by mouth daily. 08/23/19   [place], [name] G, MD  irbesartan (AVAPRO) 75 MG tablet  TAKE 1 TABLET BY MOUTH EVERY DAY Patient taking differently: Take 75 mg by mouth daily.  12/05/19   [place], [name] G, MD  levothyroxine (SYNTHROID) 100 MCG tablet Take 1 tablet (100 mcg total) by mouth daily. 09/15/19   [place], [name] G, MD  Multiple Vitamin (MULTI-VITAMINS) TABS Take 1 tablet by mouth daily.     [provider]  multivitamin-lutein (OCUVITE-LUTEIN) CAPS capsule Take 1 capsule by mouth daily.    [provider]  NEXLETOL 180 MG TABS Take 180 mg by mouth daily.  07/18/19   [provider]  Omega-3 1000 MG CAPS Take 1,000 mg by mouth daily.     [provider]  TURMERIC PO Take 1,000 mg by mouth daily.    [provider]  Ubiquinol 100 MG CAPS Take 100 mg by mouth daily.    [provider]    Allergies    Benazepril hcl, Sulfa antibiotics, and Penicillins  Review of Systems   Review of Systems  Constitutional: Negative for chills and fever.  Gastrointestinal: Positive for nausea.  Genitourinary: Positive for difficulty urinating and flank pain.  All other systems reviewed and are negative.   Physical Exam Updated Vital Signs BP (!) 142/91   Pulse 69   Temp 98 F (36.7 C)   Resp 16   Ht 5\' 11"  (1.803 m)   Wt 90.7 kg   SpO2 100%   BMI 27.89 kg/m   Physical Exam Vitals and nursing note reviewed.  Constitutional:      Appearance: He is ill-appearing and diaphoretic.  HENT:     Head: Normocephalic and atraumatic.  Eyes:     Conjunctiva/sclera: Conjunctivae normal.  Cardiovascular:     Rate and Rhythm: Normal rate and regular rhythm.  Pulmonary:     Effort: Pulmonary effort is normal.     Breath sounds: Normal breath sounds. No wheezing, rhonchi or rales.  Abdominal:     Palpations: Abdomen is soft.     Tenderness: There is abdominal tenderness. There is no right CVA tenderness, left CVA tenderness, guarding or rebound.     Comments: Soft, + LLQ abdominal TTP, +BS throughout, no r/g/r, neg murphy's, neg mcburney's, no CVA TTP  Musculoskeletal:     Cervical back: Neck supple.  Skin:    General: Skin is warm.  Neurological:     Mental Status: He is alert.     ED Results / Procedures / Treatments   Labs (all labs ordered are listed, but only abnormal results are displayed) Labs Reviewed  BASIC METABOLIC PANEL - Abnormal; Notable for the following components:      Result Value   Potassium 3.4 (*)    Glucose, Bld 114 (*)    BUN 34 (*)    Creatinine, Ser 1.95 (*)    GFR, Estimated 38 (*)    All other components within normal limits  CBC WITH DIFFERENTIAL/PLATELET - Abnormal; Notable for the following components:   Abs Immature Granulocytes 0.08 (*)    All other components within normal limits  URINALYSIS, ROUTINE W REFLEX  MICROSCOPIC    EKG None  Radiology CT Renal Stone Study  Result Date: 03/11/2020 CLINICAL DATA:  59 year old male with low back and flank pain acute onset. EXAM: CT ABDOMEN AND PELVIS WITHOUT CONTRAST TECHNIQUE: Multidetector CT imaging of the abdomen and pelvis was performed following the standard protocol without IV contrast. COMPARISON:  CT Abdomen and Pelvis 08/06/2017. FINDINGS: Lower chest: Negative. Hepatobiliary: Negative gallbladder. Stable noncontrast liver  since 2019 with small benign probable cyst of the anterior left hepatic lobe on series 2, image 15. Pancreas: Negative noncontrast pancreas. Spleen: Negative. Adrenals/Urinary Tract: Normal adrenal glands. Chronic right renal cysts, most pronounced in the mid and lower pole, have not significantly changed since 2019 and have simple fluid density. One of the small lateral cortical cysts is partially calcified (series 4, image 67) but there is no right nephrolithiasis. No right hydronephrosis, and the right ureter is decompressed to the bladder. However, on the left there is mild hydroureter, and also mild left hydronephrosis suspected superimposed on benign left renal parapelvic cysts. The left ureter remains mildly dilated to just before the ureterovesical junction where a punctate 2-3 mm calculus is identified (coronal image 60 and series 2, image 71). Superimposed chronic pelvic phleboliths again noted and more numerous on the left. There are also left intrarenal calculi individually up to 5 mm in the lower pole. Urinary bladder is decompressed and otherwise unremarkable. Stomach/Bowel: Decompressed large bowel. Moderate diverticulosis of the descending and sigmoid colon with no active inflammation identified. Normal appendix (series 2, image 43). Negative terminal ileum. No dilated small bowel. Mostly decompressed stomach and duodenum. No free air, free fluid. Vascular/Lymphatic: Aortoiliac calcified atherosclerosis. Vascular patency is not  evaluated in the absence of IV contrast. No lymphadenopathy. Reproductive: Chronic fat containing left inguinal hernia is stable. No herniated bowel. Other: No pelvic free fluid. Musculoskeletal: Advanced chronic disc and endplate degeneration at the thoracolumbar junction, L1-L2. Moderate chronic lower lumbar facet arthropathy in the setting of grade 1 spondylolisthesis L4-L5. No acute osseous abnormality identified. IMPRESSION: 1. Acute obstructive uropathy on the left with a punctate 2-3 mm calculus just proximal to the left UVJ. Superimposed left nephrolithiasis, 4-5 mm chronic benign renal cysts also, including left side parapelvic cysts. 2. No other acute or inflammatory process identified in the noncontrast abdomen or pelvis. 3. Chronic fat containing left inguinal hernia. Descending and sigmoid diverticulosis. Lumbar spine degeneration. Aortic Atherosclerosis (ICD10-I70.0). Electronically Signed   By: Odessa FlemingH  Hall M.D.   On: 03/11/2020 18:46    Procedures Procedures (including critical care time)  Medications Ordered in ED Medications  lidocaine (XYLOCAINE) 2 % jelly (has no administration in time range)  oxyCODONE-acetaminophen (PERCOCET/ROXICET) 5-325 MG per tablet 1 tablet (1 tablet Oral Given 03/11/20 1814)  ondansetron (ZOFRAN-ODT) disintegrating tablet 4 mg (4 mg Oral Given 03/11/20 1815)  sodium chloride 0.9 % bolus 1,000 mL ( Intravenous Stopped 03/11/20 2154)  ketorolac (TORADOL) 30 MG/ML injection 15 mg (15 mg Intravenous Given 03/11/20 2054)  morphine 4 MG/ML injection 4 mg (4 mg Intravenous Given 03/11/20 2226)  oxyCODONE-acetaminophen (PERCOCET/ROXICET) 5-325 MG per tablet 1 tablet (1 tablet Oral Given 03/12/20 0010)  methocarbamol (ROBAXIN) tablet 500 mg (500 mg Oral Given 03/12/20 0010)    ED Course  I have reviewed the triage vital signs and the nursing notes.  Pertinent labs & imaging results that were available during my care of the patient were reviewed by me and considered in my  medical decision making (see chart for details).    MDM Rules/Calculators/A&P                          59 year old male who presents to the ED today with complaint of sudden onset left flank/left lower quadrant pain that began around 5 PM today with associated nausea and dry heaving.  History of kidney stones.  Has also been having difficulty urinating today.  On arrival to  the ED vitals stable, patient is afebrile, nontachycardic nontachypneic.  He was provided with Zofran and Percocet in the ED with mild relief however when he is brought back to the room he states his pain is worsened.  CT scan was obtained while patient was in the waiting room which does show a 2 to 3 mm stone at the left UVJ with obstruction.  Also with superimposed left nephrolithiasis with renal cyst.  In the obstructive nature will obtain labs to check kidney function, plan for urinalysis.  Patient again states he has been unable to urinate, bladder scan was obtained with 37 cc.  Will provide fluids at this time and see if patient can urinate for [place], have stated that if he is unable to urinate he will need a Foley catheter.    CBC without leukocytosis.  Hemoglobin stable at 15.8. BMP with an elevated creatinine 1.95; slightly elevated compared to baseline.   Lab Results  Component Value Date   CREATININE 1.95 (H) 03/11/2020   CREATININE 1.47 (H) 03/04/2020   CREATININE 1.44 09/12/2019   Reevaluation patient still unable to urinate.  Nursing staff attempted to place a Foley catheter after additional pain medication of 4 mg morphine provided.  However patient was uncooperative during exam and continues to clench causing it to be difficult to place catheter.  I personally took the bedside ultrasound into the room and it does appear patient has fluid in his bladder however does not appear overtly distended.  He does appear to be making urine and it is difficult to say why he is unable to urinate at this time.  Patient continues  to get up and attempt to urinate however continues to be unsuccessful.  He states that this is "normal" for him however his wife is at bedside and states it is not normal for him to not have been able to urinate for greater than 12 hours.  I do not feel patient is appropriate for discharge at this time until he is able to urinate for [place], will provide additional pain medication and muscle relaxer to see if this does not help patient's pelvic floor relax prior to reattempt at Foley catheter.   At shift change case signed out to Dr. Adela Lank who will dispo patient accordingly after he is able to urinate.   This note was prepared using Dragon voice recognition software and may include unintentional dictation errors due to the inherent limitations of voice recognition software.  Final Clinical Impression(s) / ED Diagnoses Final diagnoses:  None    Rx / DC Orders ED Discharge Orders    None       Tanda Rockers, PA-C 03/12/20 0035    Sabino Donovan, MD 03/12/20 [phone]

## 2020-03-11 NOTE — ED Notes (Signed)
Patient transported to CT 

## 2020-03-11 NOTE — ED Notes (Signed)
Pt unable to give urine at this time.

## 2020-03-11 NOTE — ED Notes (Signed)
Po fluids given per provider. Pt still unable to void.

## 2020-03-11 NOTE — H&P (Signed)
[name] is an 59 y.o. male.   Chief Complaint: Left shoulder pain HPI: [name] is here today for follow-up of his left shoulder.  Unfortunately it continues to bother him.  The shot helped some and then it starts to come back again.  This has been going on and off for years.  He is at the point where he would like this fixed.  He denies any recent injuries.    MRI:  I reviewed an MRI scan films and report of a study done at [facility] on 12/19/19.  This shows a fairly high-grade partial-thickness tear of the supraspinatus and infraspinatus.  Past Medical History:  Diagnosis Date  . ADHD   . Arthritis   . Chronic kidney disease   . GERD (gastroesophageal reflux disease)   . History of kidney stones   . Hypertension   . Hypothyroidism   . Migraine headache without aura     Past Surgical History:  Procedure Laterality Date  . COLONOSCOPY    . HERNIA REPAIR  2010   Bilateral inguinal  . KNEE ARTHROSCOPY    . TONSILLECTOMY    . WISDOM TOOTH EXTRACTION      Family History  Problem Relation Age of Onset  . Mental illness Mother   . Cancer Paternal Aunt        lung  . Cancer Paternal Grandmother        lung  . Diabetes Neg Hx    Social History:  reports that he has never smoked. He has never used smokeless tobacco. He reports current alcohol use. He reports that he does not use drugs.  Allergies:  Allergies  Allergen Reactions  . Benazepril Hcl Anaphylaxis and Swelling  . Sulfa Antibiotics Diarrhea and Other (See Comments)  . Penicillins Other (See Comments)    Unknown    No medications prior to admission.    No results found for this or any previous visit (from the past 48 hour(s)). No results found.  Review of Systems  Musculoskeletal: Positive for arthralgias.       Left shoulder  All other systems reviewed and are negative.   There were no vitals taken for this visit. Physical Exam Constitutional:      Appearance: Normal appearance.   HENT:     Head: Normocephalic and atraumatic.     Nose: Nose normal.     Mouth/Throat:     Pharynx: Oropharynx is clear.  Eyes:     Extraocular Movements: Extraocular movements intact.  Cardiovascular:     Rate and Rhythm: Normal rate and regular rhythm.     Pulses: Normal pulses.  Pulmonary:     Effort: Pulmonary effort is normal.  Abdominal:     Palpations: Abdomen is soft.  Musculoskeletal:     Cervical back: Normal range of motion.     Comments: Examination of the left shoulder shows full range of motion.  He has a positive Hawkins and impingement.  No tenderness to palpation of the [facility] joint.  He has 5 out of 5 strength but pain with rotator cuff testing.  He is neurovascularly intact distally.    Skin:    General: Skin is warm and dry.  Neurological:     General: No focal deficit present.     Mental Status: He is alert and oriented to person, place, and time.  Psychiatric:        Mood and Affect: Mood normal.        Behavior: Behavior normal.  Thought Content: Thought content normal.        Judgment: Judgment normal.      Assessment/Plan Assessment:  Left shoulder partial rotator cuff tear by MRI 2021 injected most recently 01/01/20  Plan: After discussing with the patient we informed him that the only long-term solution at this point is a left shoulder arthroscopy.  I reviewed the risk of anesthesia, infection, DVT, bleeding related to a left shoulder arthroscopy.  He is in agreement and would like to proceed.  He will likely be in a sling for a few days afterwards for comfort and then will him out of that as he can tolerate.  We did discuss physical therapy afterwards.  We will see him back a week after his surgery to get him started with this if needed.   Ginger Organ Conard Alvira, PA-C 03/11/2020, 10:36 AM

## 2020-03-12 ENCOUNTER — Encounter (HOSPITAL_COMMUNITY): Admission: RE | Payer: Self-pay | Source: Ambulatory Visit

## 2020-03-12 ENCOUNTER — Ambulatory Visit (HOSPITAL_COMMUNITY)
Admission: RE | Admit: 2020-03-12 | Payer: Commercial Managed Care - PPO | Source: Ambulatory Visit | Admitting: Orthopaedic Surgery

## 2020-03-12 LAB — URINALYSIS, MICROSCOPIC (REFLEX)

## 2020-03-12 LAB — URINALYSIS, ROUTINE W REFLEX MICROSCOPIC
Bilirubin Urine: NEGATIVE
Glucose, UA: NEGATIVE mg/dL
Ketones, ur: NEGATIVE mg/dL
Leukocytes,Ua: NEGATIVE
Nitrite: NEGATIVE
Protein, ur: NEGATIVE mg/dL
Specific Gravity, Urine: >1.03 — ABNORMAL HIGH (ref 1.005–1.030)
pH: 5.5 (ref 5.0–8.0)

## 2020-03-12 SURGERY — ARTHROSCOPY, SHOULDER
Anesthesia: General | Site: Shoulder | Laterality: Left

## 2020-03-12 MED ORDER — MORPHINE SULFATE 15 MG PO TABS
7.5000 mg | ORAL_TABLET | ORAL | 0 refills | Status: DC | PRN
Start: 2020-03-12 — End: 2020-03-12

## 2020-03-12 MED ORDER — ONDANSETRON HCL 4 MG/2ML IJ SOLN
4.0000 mg | Freq: Once | INTRAMUSCULAR | Status: AC
  Administered 2020-03-12: 4 mg via INTRAVENOUS
  Filled 2020-03-12: qty 2

## 2020-03-12 MED ORDER — MORPHINE SULFATE (PF) 4 MG/ML IV SOLN
4.0000 mg | Freq: Once | INTRAVENOUS | Status: AC
  Administered 2020-03-12: 4 mg via INTRAVENOUS
  Filled 2020-03-12: qty 1

## 2020-03-12 MED ORDER — OXYCODONE-ACETAMINOPHEN 5-325 MG PO TABS
1.0000 | ORAL_TABLET | Freq: Once | ORAL | Status: AC
  Administered 2020-03-12: 1 via ORAL
  Filled 2020-03-12: qty 1

## 2020-03-12 MED ORDER — MORPHINE SULFATE 15 MG PO TABS
7.5000 mg | ORAL_TABLET | ORAL | 0 refills | Status: DC | PRN
Start: 2020-03-12 — End: 2020-04-11

## 2020-03-12 MED ORDER — METHOCARBAMOL 500 MG PO TABS
500.0000 mg | ORAL_TABLET | Freq: Once | ORAL | Status: AC
  Administered 2020-03-12: 500 mg via ORAL
  Filled 2020-03-12: qty 1

## 2020-03-12 MED ORDER — ONDANSETRON 4 MG PO TBDP: ORAL_TABLET | ORAL | 0 refills | Status: AC

## 2020-03-12 NOTE — Discharge Instructions (Signed)
Return for fever, uncontrolled pain.   Take 4 over the counter ibuprofen tablets 3 times a day or 2 over-the-counter naproxen tablets twice a day for pain. Also take tylenol 1000mg (2 extra strength) four times a day.   Then take the pain medicine if you feel like you need it. Narcotics do not help with the pain, they only make you care about it less.  You can become addicted to this, people may break into your house to steal it.  It will constipate you.  If you drive under the influence of this medicine you can get a DUI.

## 2020-03-12 NOTE — ED Notes (Signed)
PT requested Pain medicine before discharge.

## 2020-03-12 NOTE — ED Notes (Signed)
PT able to urinate. MD aware.

## 2020-03-12 NOTE — ED Notes (Signed)
ED Provider at bedside. 

## 2020-03-14 ENCOUNTER — Other Ambulatory Visit: Payer: Self-pay | Admitting: Orthopaedic Surgery

## 2020-03-14 DIAGNOSIS — N200 Calculus of kidney: Secondary | ICD-10-CM | POA: Diagnosis not present

## 2020-03-14 DIAGNOSIS — R3912 Poor urinary stream: Secondary | ICD-10-CM | POA: Diagnosis not present

## 2020-03-14 DIAGNOSIS — N281 Cyst of kidney, acquired: Secondary | ICD-10-CM | POA: Diagnosis not present

## 2020-03-14 DIAGNOSIS — N201 Calculus of ureter: Secondary | ICD-10-CM | POA: Diagnosis not present

## 2020-03-19 NOTE — Patient Instructions (Signed)
DUE TO COVID-19 ONLY ONE VISITOR IS ALLOWED TO COME WITH YOU AND STAY IN THE WAITING ROOM ONLY DURING PRE OP AND PROCEDURE DAY OF SURGERY. THE 1 VISITOR  MAY VISIT WITH YOU AFTER SURGERY IN YOUR PRIVATE ROOM DURING VISITING HOURS ONLY!  YOU NEED TO HAVE A COVID 19 TEST ON: 03/20/20 @ 10:55 AM, THIS TEST MUST BE DONE BEFORE SURGERY,  COVID TESTING SITE [address] [place] [address], IT IS ON THE RIGHT GOING OUT [address] APPROXIMATELY  2 MINUTES PAST [employer] ON THE RIGHT. ONCE YOUR COVID TEST IS COMPLETED,  PLEASE BEGIN THE QUARANTINE INSTRUCTIONS AS OUTLINED IN YOUR HANDOUT.                Farrel Guimond Stansbery   Your procedure is scheduled on: 03/22/20   Report to [facility] Main  Entrance   Report to admitting at: 1:30  PM     Call this number if you have problems the morning of surgery [phone]    Remember:   NO SOLID FOOD AFTER MIDNIGHT THE NIGHT PRIOR TO SURGERY. NOTHING BY MOUTH EXCEPT CLEAR LIQUIDS UNTIL: 12:30 PM . PLEASE FINISH ENSURE DRINK PER SURGEON ORDER  WHICH NEEDS TO BE COMPLETED AT : 12:30 PM.  CLEAR LIQUID DIET   Foods Allowed                                                                     Foods Excluded  Coffee and tea, regular and decaf                             liquids that you cannot  Plain Jell-O any favor except red or purple                                           see through such as: Fruit ices (not with fruit pulp)                                     milk, soups, orange juice  Iced Popsicles                                    All solid food Carbonated beverages, regular and diet                                    Cranberry, grape and apple juices Sports drinks like Gatorade Lightly seasoned clear broth or consume(fat free) Sugar, honey syrup  Sample Menu Breakfast                                Lunch  Supper Cranberry juice                    Beef broth                             Chicken broth Jell-O                                     Grape juice                           Apple juice Coffee or tea                        Jell-O                                      Popsicle                                                Coffee or tea                        Coffee or tea  _____________________________________________________________________   BRUSH YOUR TEETH MORNING OF SURGERY AND RINSE YOUR MOUTH OUT, NO CHEWING GUM CANDY OR MINTS.    Take these medicines the morning of surgery with A SIP OF WATER: synthroid.                               You may not have any metal on your body including hair pins and              piercings  Do not wear jewelry, lotions, powders or perfumes, deodorant             Men may shave face and neck.ours prior to surgery.   Do not bring valuables to the hospital. [facility] IS NOT             RESPONSIBLE   FOR VALUABLES.  Contacts, dentures or bridgework may not be worn into surgery.  Leave suitcase in the car. After surgery it may be brought to your room.     Patients discharged the day of surgery will not be allowed to drive home. IF YOU ARE HAVING SURGERY AND GOING HOME THE SAME DAY, YOU MUST HAVE AN ADULT TO DRIVE YOU HOME AND BE WITH YOU FOR 24 HOURS. YOU MAY GO HOME BY TAXI OR UBER OR ORTHERWISE, BUT AN ADULT MUST ACCOMPANY YOU HOME AND STAY WITH YOU FOR 24 HOURS.  Name and phone number of your driver:  Special Instructions: N/A              Please read over the following fact sheets you were given: _____________________________________________________________________          [facility] - Preparing for Surgery Before surgery, you can play an important role.  Because skin is not sterile, your skin needs to be as free of germs as possible.  You can reduce the number of germs on your skin by washing  with CHG (chlorahexidine gluconate) soap before surgery.  CHG is an antiseptic cleaner which kills germs and bonds with  the skin to continue killing germs even after washing. Please DO NOT use if you have an allergy to CHG or antibacterial soaps.  If your skin becomes reddened/irritated stop using the CHG and inform your nurse when you arrive at Short Stay. Do not shave (including legs and underarms) for at least 48 hours prior to the first CHG shower.  You may shave your face/neck. Please follow these instructions carefully:  1.  Shower with CHG Soap the night before surgery and the  morning of Surgery.  2.  If you choose to wash your hair, wash your hair first as usual with your  normal  shampoo.  3.  After you shampoo, rinse your hair and body thoroughly to remove the  shampoo.                           4.  Use CHG as you would any other liquid soap.  You can apply chg directly  to the skin and wash                       Gently with a scrungie or clean washcloth.  5.  Apply the CHG Soap to your body ONLY FROM THE NECK DOWN.   Do not use on face/ open                           Wound or open sores. Avoid contact with eyes, ears mouth and genitals (private parts).                       Wash face,  Genitals (private parts) with your normal soap.             6.  Wash thoroughly, paying special attention to the area where your surgery  will be performed.  7.  Thoroughly rinse your body with warm water from the neck down.  8.  DO NOT shower/wash with your normal soap after using and rinsing off  the CHG Soap.                9.  Pat yourself dry with a clean towel.            10.  Wear clean pajamas.            11.  Place clean sheets on your bed the night of your first shower and do not  sleep with pets. Day of Surgery : Do not apply any lotions/deodorants the morning of surgery.  Please wear clean clothes to the hospital/surgery center.  FAILURE TO FOLLOW THESE INSTRUCTIONS MAY RESULT IN THE CANCELLATION OF YOUR SURGERY PATIENT SIGNATURE_________________________________  NURSE  SIGNATURE__________________________________  ________________________________________________________________________   Adam Phenix  An incentive spirometer is a tool that can help keep your lungs clear and active. This tool measures how well you are filling your lungs with each breath. Taking long deep breaths may help reverse or decrease the chance of developing breathing (pulmonary) problems (especially infection) following:  A long period of time when you are unable to move or be active. BEFORE THE PROCEDURE   If the spirometer includes an indicator to show your best effort, your nurse or respiratory therapist will set it to a desired goal.  If possible, sit up straight  or lean slightly forward. Try not to slouch.  Hold the incentive spirometer in an upright position. INSTRUCTIONS FOR USE  1. Sit on the edge of your bed if possible, or sit up as far as you can in bed or on a chair. 2. Hold the incentive spirometer in an upright position. 3. Breathe out normally. 4. Place the mouthpiece in your mouth and seal your lips tightly around it. 5. Breathe in slowly and as deeply as possible, raising the piston or the ball toward the top of the column. 6. Hold your breath for 3-5 seconds or for as long as possible. Allow the piston or ball to fall to the bottom of the column. 7. Remove the mouthpiece from your mouth and breathe out normally. 8. Rest for a few seconds and repeat Steps 1 through 7 at least 10 times every 1-2 hours when you are awake. Take your time and take a few normal breaths between deep breaths. 9. The spirometer may include an indicator to show your best effort. Use the indicator as a goal to work toward during each repetition. 10. After each set of 10 deep breaths, practice coughing to be sure your lungs are clear. If you have an incision (the cut made at the time of surgery), support your incision when coughing by placing a pillow or rolled up towels firmly  against it. Once you are able to get out of bed, walk around indoors and cough well. You may stop using the incentive spirometer when instructed by your caregiver.  RISKS AND COMPLICATIONS  Take your time so you do not get dizzy or light-headed.  If you are in pain, you may need to take or ask for pain medication before doing incentive spirometry. It is harder to take a deep breath if you are having pain. AFTER USE  Rest and breathe slowly and easily.  It can be helpful to keep track of a log of your progress. Your caregiver can provide you with a simple table to help with this. If you are using the spirometer at home, follow these instructions: [place] IF:   You are having difficultly using the spirometer.  You have trouble using the spirometer as often as instructed.  Your pain medication is not giving enough relief while using the spirometer.  You develop fever of 100.5 F (38.1 C) or higher. SEEK IMMEDIATE MEDICAL CARE IF:   You cough up bloody sputum that had not been present before.  You develop fever of 102 F (38.9 C) or greater.  You develop worsening pain at or near the incision site. MAKE SURE YOU:   Understand these instructions.  Will watch your condition.  Will get help right away if you are not doing well or get worse. Document Released: 08/03/2006 Document Revised: 06/15/2011 Document Reviewed: 10/04/2006 [facility] Patient Information 2014 [place], [place].   ________________________________________________________________________

## 2020-03-20 ENCOUNTER — Other Ambulatory Visit: Payer: Self-pay

## 2020-03-20 ENCOUNTER — Encounter (HOSPITAL_COMMUNITY): Payer: Self-pay

## 2020-03-20 ENCOUNTER — Other Ambulatory Visit (HOSPITAL_COMMUNITY)
Admission: RE | Admit: 2020-03-20 | Discharge: 2020-03-20 | Disposition: A | Discharge status: Home / Self Care | Payer: 59 | Source: Ambulatory Visit | Attending: Orthopaedic Surgery | Admitting: Orthopaedic Surgery

## 2020-03-20 ENCOUNTER — Encounter (HOSPITAL_COMMUNITY)
Admission: RE | Admit: 2020-03-20 | Discharge: 2020-03-20 | Disposition: A | Discharge status: Home / Self Care | Payer: 59 | Source: Ambulatory Visit | Attending: Orthopaedic Surgery | Admitting: Orthopaedic Surgery

## 2020-03-20 DIAGNOSIS — Z20822 Contact with and (suspected) exposure to covid-19: Secondary | ICD-10-CM | POA: Insufficient documentation

## 2020-03-20 DIAGNOSIS — Z01812 Encounter for preprocedural laboratory examination: Secondary | ICD-10-CM | POA: Insufficient documentation

## 2020-03-20 HISTORY — DX: Cardiac murmur, unspecified: R01.1

## 2020-03-20 LAB — SARS CORONAVIRUS 2 (TAT 6-24 HRS): SARS Coronavirus 2: NEGATIVE

## 2020-03-20 NOTE — H&P (Signed)
[name] is an 59 y.o. male.   Chief Complaint: left shoulder pain HPI: [name] is here today for follow-up of his left shoulder.  Unfortunately it continues to bother him.  The shot helped some and then it starts to come back again.  This has been going on and off for years.  He is at the point where he would like this fixed.  He denies any recent injuries.    MRI:  I reviewed an MRI scan films and report of a study done at [facility] on 12/19/19.  This shows a fairly high-grade partial-thickness tear of the supraspinatus and infraspinatus.  Past Medical History:  Diagnosis Date  . ADHD   . Arthritis   . Chronic kidney disease   . GERD (gastroesophageal reflux disease)   . Heart murmur   . History of kidney stones   . Hypertension   . Hypothyroidism   . Migraine headache without aura     Past Surgical History:  Procedure Laterality Date  . COLONOSCOPY    . HERNIA REPAIR  2010   Bilateral inguinal  . KNEE ARTHROSCOPY    . TONSILLECTOMY    . WISDOM TOOTH EXTRACTION      Family History  Problem Relation Age of Onset  . Mental illness Mother   . Cancer Paternal Aunt        lung  . Cancer Paternal Grandmother        lung  . Diabetes Neg Hx    Social History:  reports that he has never smoked. He has never used smokeless tobacco. He reports current alcohol use. He reports that he does not use drugs.  Allergies:  Allergies  Allergen Reactions  . Benazepril Hcl Anaphylaxis and Swelling  . Sulfa Antibiotics Diarrhea and Other (See Comments)  . Penicillins Other (See Comments)    Unknown    No medications prior to admission.    Results for orders placed or performed during the hospital encounter of 03/20/20 (from the past 48 hour(s))  SARS CORONAVIRUS 2 (TAT 6-24 HRS) Nasopharyngeal Nasopharyngeal Swab     Status: None   Collection Time: 03/20/20 10:12 AM   Specimen: Nasopharyngeal Swab  Result Value Ref Range   SARS Coronavirus 2 NEGATIVE NEGATIVE     Comment: (NOTE) SARS-CoV-2 target nucleic acids are NOT DETECTED.  The SARS-CoV-2 RNA is generally detectable in upper and lower respiratory specimens during the acute phase of infection. Negative results do not preclude SARS-CoV-2 infection, do not rule out co-infections with other pathogens, and should not be used as the sole basis for treatment or other patient management decisions. Negative results must be combined with clinical observations, patient history, and epidemiological information. The expected result is Negative.  Fact Sheet for Patients: HairSlick.no  Fact Sheet for Healthcare Providers: quierodirigir.com  This test is not yet approved or cleared by the [place] FDA and  has been authorized for detection and/or diagnosis of SARS-CoV-2 by FDA under an Emergency Use Authorization (EUA). This EUA will remain  in effect (meaning this test can be used) for the duration of the COVID-19 declaration under Se ction 564(b)(1) of the Act, 21 U.S.C. section 360bbb-3(b)(1), unless the authorization is terminated or revoked sooner.  Performed at [facility], [address] [place], [place] [address]    No results found.  Review of Systems  Musculoskeletal: Positive for arthralgias.       Left shoulder  All other systems reviewed and are negative.  There were no vitals taken for this visit. Physical Exam Constitutional:      Appearance: Normal appearance.  HENT:     Head: Normocephalic and atraumatic.     Mouth/Throat:     Pharynx: Oropharynx is clear.  Eyes:     Extraocular Movements: Extraocular movements intact.  Cardiovascular:     Rate and Rhythm: Normal rate and regular rhythm.     Pulses: Normal pulses.  Pulmonary:     Effort: Pulmonary effort is normal.  Abdominal:     Palpations: Abdomen is soft.  Musculoskeletal:     Cervical back: Normal range of motion.     Comments:  Examination of the left shoulder shows full range of motion.  He has a positive Hawkins and impingement.  No tenderness to palpation of the [facility] joint.  He has 5 out of 5 strength but pain with rotator cuff testing.  He is neurovascularly intact distally.    Neurological:     Mental Status: He is alert.      Assessment/Plan Assessment: Left shoulder partial rotator cuff tear by MRI 2021 injected most recently 01/01/20  Plan: After discussing with the patient we informed him that the only long-term solution at this point is a left shoulder arthroscopy.  I reviewed the risk of anesthesia, infection, DVT, bleeding related to a left shoulder arthroscopy.  He is in agreement and would like to proceed.  He will likely be in a sling for a few days afterwards for comfort and then will him out of that as he can tolerate.  We did discuss physical therapy afterwards.  We will see him back a week after his surgery to get him started with this if needed. He can call with any questions or concerns in the interim.  Keith Organ Chavie Kolinski, PA-C 03/20/2020, 5:49 PM

## 2020-03-20 NOTE — Progress Notes (Signed)
COVID Vaccine Completed: Yes Date COVID Vaccine completed: 09/21/19 COVID vaccine manufacturer:    Moderna     PCP - Dr. [name] Swazil[place]. Cardiologist - No  Chest x-ray -  EKG -03/04/20  Stress Test -  ECHO - 12/24/15 Cardiac Cath -  Pacemaker/ICD device last checked:  Sleep Study -  CPAP -   Fasting Blood Sugar -  Checks Blood Sugar _____ times a day  Blood Thinner Instructions: Aspirin Instructions: Last Dose:  Anesthesia review: Hx: HTN,Heart murmur.  Patient denies shortness of breath, fever, cough and chest pain at PAT appointment   Patient verbalized understanding of instructions that were given to them at the PAT appointment. Patient was also instructed that they will need to review over the PAT instructions again at home before surgery.

## 2020-03-21 MED ORDER — TRANEXAMIC ACID 1000 MG/10ML IV SOLN
2000.0000 mg | INTRAVENOUS | Status: DC
  Filled 2020-03-21: qty 20

## 2020-03-21 NOTE — Progress Notes (Signed)
Pt aware to arrive at [facility] admitting at 1300 on Friday 03/22/2020 for scheduled procedure.

## 2020-03-22 ENCOUNTER — Ambulatory Visit (HOSPITAL_COMMUNITY): Payer: 59 | Admitting: Physician Assistant

## 2020-03-22 ENCOUNTER — Ambulatory Visit (HOSPITAL_COMMUNITY): Payer: 59 | Admitting: Certified Registered"

## 2020-03-22 ENCOUNTER — Encounter (HOSPITAL_COMMUNITY)
Admission: RE | Disposition: A | Discharge status: Home / Self Care | Payer: Self-pay | Source: Home / Self Care | Attending: Orthopaedic Surgery

## 2020-03-22 ENCOUNTER — Telehealth (HOSPITAL_COMMUNITY): Payer: Self-pay | Admitting: *Deleted

## 2020-03-22 ENCOUNTER — Ambulatory Visit (HOSPITAL_COMMUNITY)
Admission: RE | Admit: 2020-03-22 | Discharge: 2020-03-22 | Disposition: A | Discharge status: Home / Self Care | Payer: 59 | Attending: Orthopaedic Surgery | Admitting: Orthopaedic Surgery

## 2020-03-22 ENCOUNTER — Other Ambulatory Visit: Payer: Self-pay

## 2020-03-22 DIAGNOSIS — E039 Hypothyroidism, unspecified: Secondary | ICD-10-CM | POA: Insufficient documentation

## 2020-03-22 DIAGNOSIS — Z882 Allergy status to sulfonamides status: Secondary | ICD-10-CM | POA: Diagnosis not present

## 2020-03-22 DIAGNOSIS — Z880 Allergy status to penicillin: Secondary | ICD-10-CM | POA: Insufficient documentation

## 2020-03-22 DIAGNOSIS — M1990 Unspecified osteoarthritis, unspecified site: Secondary | ICD-10-CM | POA: Insufficient documentation

## 2020-03-22 DIAGNOSIS — M7542 Impingement syndrome of left shoulder: Secondary | ICD-10-CM | POA: Insufficient documentation

## 2020-03-22 DIAGNOSIS — E785 Hyperlipidemia, unspecified: Secondary | ICD-10-CM | POA: Diagnosis not present

## 2020-03-22 DIAGNOSIS — M75112 Incomplete rotator cuff tear or rupture of left shoulder, not specified as traumatic: Secondary | ICD-10-CM | POA: Diagnosis not present

## 2020-03-22 DIAGNOSIS — I129 Hypertensive chronic kidney disease with stage 1 through stage 4 chronic kidney disease, or unspecified chronic kidney disease: Secondary | ICD-10-CM | POA: Insufficient documentation

## 2020-03-22 DIAGNOSIS — R69 Illness, unspecified: Secondary | ICD-10-CM | POA: Diagnosis not present

## 2020-03-22 DIAGNOSIS — Z818 Family history of other mental and behavioral disorders: Secondary | ICD-10-CM | POA: Insufficient documentation

## 2020-03-22 DIAGNOSIS — G8918 Other acute postprocedural pain: Secondary | ICD-10-CM | POA: Diagnosis not present

## 2020-03-22 DIAGNOSIS — Z801 Family history of malignant neoplasm of trachea, bronchus and lung: Secondary | ICD-10-CM | POA: Diagnosis not present

## 2020-03-22 DIAGNOSIS — N189 Chronic kidney disease, unspecified: Secondary | ICD-10-CM | POA: Insufficient documentation

## 2020-03-22 HISTORY — PX: SHOULDER ARTHROSCOPY: SHX128

## 2020-03-22 SURGERY — ARTHROSCOPY, SHOULDER
Anesthesia: General | Site: Shoulder | Laterality: Left

## 2020-03-22 MED ORDER — ONDANSETRON HCL 4 MG/2ML IJ SOLN
INTRAMUSCULAR | Status: AC
  Filled 2020-03-22: qty 2

## 2020-03-22 MED ORDER — OXYCODONE HCL 5 MG/5ML PO SOLN: 5.0000 mg | Freq: Once | ORAL | Status: DC | PRN

## 2020-03-22 MED ORDER — ONDANSETRON HCL 4 MG/2ML IJ SOLN
INTRAMUSCULAR | Status: DC | PRN
  Administered 2020-03-22: 4 mg via INTRAVENOUS

## 2020-03-22 MED ORDER — SODIUM CHLORIDE 0.9 % IR SOLN
Status: DC | PRN
Start: 2020-03-22 — End: 2020-03-22
  Administered 2020-03-22 (×2): 3000 mL

## 2020-03-22 MED ORDER — PROPOFOL 10 MG/ML IV BOLUS
INTRAVENOUS | Status: DC | PRN
  Administered 2020-03-22: 200 mg via INTRAVENOUS

## 2020-03-22 MED ORDER — ORAL CARE MOUTH RINSE
15.0000 mL | Freq: Once | OROMUCOSAL | Status: AC
  Administered 2020-03-22: 14:00:00 15 mL via OROMUCOSAL

## 2020-03-22 MED ORDER — PHENYLEPHRINE 40 MCG/ML (10ML) SYRINGE FOR IV PUSH (FOR BLOOD PRESSURE SUPPORT)
PREFILLED_SYRINGE | INTRAVENOUS | Status: DC | PRN
  Administered 2020-03-22: 120 ug via INTRAVENOUS

## 2020-03-22 MED ORDER — AMISULPRIDE (ANTIEMETIC) 5 MG/2ML IV SOLN: 10.0000 mg | Freq: Once | INTRAVENOUS | Status: DC | PRN

## 2020-03-22 MED ORDER — ROCURONIUM BROMIDE 10 MG/ML (PF) SYRINGE
PREFILLED_SYRINGE | INTRAVENOUS | Status: AC
  Filled 2020-03-22: qty 10

## 2020-03-22 MED ORDER — MIDAZOLAM HCL 2 MG/2ML IJ SOLN
1.0000 mg | Freq: Once | INTRAMUSCULAR | Status: AC
  Administered 2020-03-22: 15:00:00 2 mg via INTRAVENOUS
  Filled 2020-03-22: qty 2

## 2020-03-22 MED ORDER — FENTANYL CITRATE (PF) 100 MCG/2ML IJ SOLN
INTRAMUSCULAR | Status: DC | PRN
  Administered 2020-03-22: 50 ug via INTRAVENOUS

## 2020-03-22 MED ORDER — FENTANYL CITRATE (PF) 100 MCG/2ML IJ SOLN
50.0000 ug | INTRAMUSCULAR | Status: DC
  Administered 2020-03-22: 15:00:00 100 ug via INTRAVENOUS
  Filled 2020-03-22: qty 2

## 2020-03-22 MED ORDER — ROCURONIUM BROMIDE 10 MG/ML (PF) SYRINGE
PREFILLED_SYRINGE | INTRAVENOUS | Status: DC | PRN
  Administered 2020-03-22: 30 mg via INTRAVENOUS

## 2020-03-22 MED ORDER — LACTATED RINGERS IV SOLN: INTRAVENOUS | Status: DC

## 2020-03-22 MED ORDER — ONDANSETRON HCL 4 MG/2ML IJ SOLN: 4.0000 mg | Freq: Once | INTRAMUSCULAR | Status: DC | PRN

## 2020-03-22 MED ORDER — SUCCINYLCHOLINE CHLORIDE 200 MG/10ML IV SOSY
PREFILLED_SYRINGE | INTRAVENOUS | Status: DC | PRN
  Administered 2020-03-22: 100 mg via INTRAVENOUS

## 2020-03-22 MED ORDER — LIDOCAINE HCL (PF) 2 % IJ SOLN
INTRAMUSCULAR | Status: AC
  Filled 2020-03-22: qty 5

## 2020-03-22 MED ORDER — DEXAMETHASONE SODIUM PHOSPHATE 10 MG/ML IJ SOLN
INTRAMUSCULAR | Status: DC | PRN
  Administered 2020-03-22: 4 mg via INTRAVENOUS

## 2020-03-22 MED ORDER — EPINEPHRINE PF 1 MG/ML IJ SOLN
INTRAMUSCULAR | Status: AC
  Filled 2020-03-22: qty 1

## 2020-03-22 MED ORDER — FENTANYL CITRATE (PF) 100 MCG/2ML IJ SOLN
INTRAMUSCULAR | Status: AC
  Filled 2020-03-22: qty 2

## 2020-03-22 MED ORDER — POVIDONE-IODINE 10 % EX SWAB
2.0000 "application " | Freq: Once | CUTANEOUS | Status: AC
  Administered 2020-03-22: 2 via TOPICAL

## 2020-03-22 MED ORDER — BUPIVACAINE HCL (PF) 0.5 % IJ SOLN
INTRAMUSCULAR | Status: DC | PRN
  Administered 2020-03-22: 15 mL via PERINEURAL

## 2020-03-22 MED ORDER — HYDROCODONE-ACETAMINOPHEN 5-325 MG PO TABS
1.0000 | ORAL_TABLET | Freq: Four times a day (QID) | ORAL | 0 refills | Status: DC | PRN
Start: 2020-03-22 — End: 2020-04-11

## 2020-03-22 MED ORDER — CHLORHEXIDINE GLUCONATE 0.12 % MT SOLN: 15.0000 mL | Freq: Once | OROMUCOSAL | Status: AC

## 2020-03-22 MED ORDER — SUGAMMADEX SODIUM 200 MG/2ML IV SOLN
INTRAVENOUS | Status: DC | PRN
  Administered 2020-03-22: 200 mg via INTRAVENOUS

## 2020-03-22 MED ORDER — TRANEXAMIC ACID-NACL 1000-0.7 MG/100ML-% IV SOLN
1000.0000 mg | INTRAVENOUS | Status: DC
  Filled 2020-03-22: qty 100

## 2020-03-22 MED ORDER — FENTANYL CITRATE (PF) 100 MCG/2ML IJ SOLN: 25.0000 ug | INTRAMUSCULAR | Status: DC | PRN

## 2020-03-22 MED ORDER — DEXAMETHASONE SODIUM PHOSPHATE 10 MG/ML IJ SOLN
INTRAMUSCULAR | Status: AC
  Filled 2020-03-22: qty 1

## 2020-03-22 MED ORDER — PROPOFOL 10 MG/ML IV BOLUS
INTRAVENOUS | Status: AC
  Filled 2020-03-22: qty 20

## 2020-03-22 MED ORDER — LIDOCAINE 2% (20 MG/ML) 5 ML SYRINGE
INTRAMUSCULAR | Status: DC | PRN
  Administered 2020-03-22: 60 mg via INTRAVENOUS

## 2020-03-22 MED ORDER — BUPIVACAINE-EPINEPHRINE (PF) 0.5% -1:200000 IJ SOLN
INTRAMUSCULAR | Status: AC
  Filled 2020-03-22: qty 30

## 2020-03-22 MED ORDER — EPINEPHRINE PF 1 MG/ML IJ SOLN
INTRAMUSCULAR | Status: DC | PRN
  Administered 2020-03-22: 1 mg

## 2020-03-22 MED ORDER — OXYCODONE HCL 5 MG PO TABS: 5.0000 mg | ORAL_TABLET | Freq: Once | ORAL | Status: DC | PRN

## 2020-03-22 MED ORDER — VANCOMYCIN HCL IN DEXTROSE 1-5 GM/200ML-% IV SOLN
1000.0000 mg | INTRAVENOUS | Status: AC
  Administered 2020-03-22 (×2): 1000 mg via INTRAVENOUS
  Filled 2020-03-22: qty 200

## 2020-03-22 MED ORDER — BUPIVACAINE LIPOSOME 1.3 % IJ SUSP
INTRAMUSCULAR | Status: DC | PRN
Start: 2020-03-22 — End: 2020-03-22
  Administered 2020-03-22: 10 mL

## 2020-03-22 MED ORDER — EPHEDRINE SULFATE-NACL 50-0.9 MG/10ML-% IV SOSY
PREFILLED_SYRINGE | INTRAVENOUS | Status: DC | PRN
  Administered 2020-03-22: 15 mg via INTRAVENOUS
  Administered 2020-03-22: 10 mg via INTRAVENOUS

## 2020-03-22 SURGICAL SUPPLY — 45 items
AID PSTN UNV HD RSTRNT DISP (MISCELLANEOUS) ×1
BLADE EXCALIBUR 4.0MM X 13CM (MISCELLANEOUS) ×1
BLADE EXCALIBUR 4.0X13 (MISCELLANEOUS) ×1 IMPLANT
BLADE SURG SZ11 CARB STEEL (BLADE) ×3 IMPLANT
BURR OVAL 8 FLU 4.0MM X 13CM (MISCELLANEOUS) ×1
BURR OVAL 8 FLU 4.0X13 (MISCELLANEOUS) ×1 IMPLANT
CANNULA SHOULDER 7CM (CANNULA) ×3 IMPLANT
CANNULA TWIST IN 8.25X7CM (CANNULA) ×3 IMPLANT
COVER SURGICAL LIGHT HANDLE (MISCELLANEOUS) ×3 IMPLANT
COVER WAND RF STERILE (DRAPES) IMPLANT
DRAPE SHEET LG 3/4 BI-LAMINATE (DRAPES) ×6 IMPLANT
DRAPE SHOULDER BEACH CHAIR (DRAPES) ×3 IMPLANT
DRAPE SURG 17X11 SM STRL (DRAPES) ×3 IMPLANT
DRAPE U-SHAPE 47X51 STRL (DRAPES) ×3 IMPLANT
DRSG ADAPTIC 3X8 NADH LF (GAUZE/BANDAGES/DRESSINGS) ×1 IMPLANT
DRSG EMULSION OIL 3X3 NADH (GAUZE/BANDAGES/DRESSINGS) ×3 IMPLANT
DRSG PAD ABDOMINAL 8X10 ST (GAUZE/BANDAGES/DRESSINGS) ×7 IMPLANT
DURAPREP 26ML APPLICATOR (WOUND CARE) ×3 IMPLANT
GAUZE SPONGE 4X4 12PLY STRL (GAUZE/BANDAGES/DRESSINGS) ×3 IMPLANT
GLOVE BIO SURGEON STRL SZ8 (GLOVE) ×6 IMPLANT
GLOVE BIOGEL PI IND STRL 8 (GLOVE) ×2 IMPLANT
GLOVE BIOGEL PI INDICATOR 8 (GLOVE) ×4
KIT BASIN OR (CUSTOM PROCEDURE TRAY) ×3 IMPLANT
KIT POSITION SHOULDER SCHLEI (MISCELLANEOUS) ×3 IMPLANT
KIT TURNOVER KIT A (KITS) IMPLANT
MANIFOLD NEPTUNE II (INSTRUMENTS) ×3 IMPLANT
NDL SAFETY ECLIPSE 18X1.5 (NEEDLE) ×1 IMPLANT
NDL SPNL 18GX3.5 QUINCKE PK (NEEDLE) ×1 IMPLANT
NEEDLE HYPO 18GX1.5 SHARP (NEEDLE) ×3
NEEDLE SPNL 18GX3.5 QUINCKE PK (NEEDLE) ×3 IMPLANT
PACK SHOULDER (CUSTOM PROCEDURE TRAY) ×3 IMPLANT
PENCIL SMOKE EVACUATOR (MISCELLANEOUS) IMPLANT
PROBE BIPOLAR ATHRO 135MM 90D (MISCELLANEOUS) ×3 IMPLANT
PROTECTOR NERVE ULNAR (MISCELLANEOUS) ×3 IMPLANT
RESTRAINT HEAD UNIVERSAL NS (MISCELLANEOUS) ×3 IMPLANT
SLING ARM FOAM STRAP LRG (SOFTGOODS) ×2 IMPLANT
SLING ARM IMMOBILIZER LRG (SOFTGOODS) IMPLANT
SLING ARM IMMOBILIZER MED (SOFTGOODS) ×3 IMPLANT
SUT ETHILON 4 0 PS 2 18 (SUTURE) ×3 IMPLANT
SYR 3ML LL SCALE MARK (SYRINGE) ×3 IMPLANT
SYR CONTROL 10ML LL (SYRINGE) ×3 IMPLANT
TAPE CLOTH SURG 6X10 WHT LF (GAUZE/BANDAGES/DRESSINGS) ×2 IMPLANT
TUBING ARTHROSCOPY IRRIG 16FT (MISCELLANEOUS) ×3 IMPLANT
TUBING CONNECTING 10 (TUBING) ×2 IMPLANT
TUBING CONNECTING 10' (TUBING) ×1

## 2020-03-22 NOTE — Progress Notes (Signed)
AssistedDr. Carolyn Witman with left, ultrasound guided, interscalene  block. Side rails up, monitors on throughout procedure. See vital signs in flow sheet. Tolerated Procedure well. ° °

## 2020-03-22 NOTE — Anesthesia Procedure Notes (Signed)
Procedure Name: Intubation Date/Time: 03/22/2020 3:12 PM Performed by: Niel Hummer, CRNA Pre-anesthesia Checklist: Patient identified, Emergency Drugs available, Suction available and Patient being monitored Patient Re-evaluated:Patient Re-evaluated prior to induction Oxygen Delivery Method: Circle system utilized Preoxygenation: Pre-oxygenation with 100% oxygen Induction Type: IV induction Laryngoscope Size: Mac and 4 Grade View: Grade II Tube type: Oral Tube size: 7.5 mm Number of attempts: 1 Airway Equipment and Method: Stylet Placement Confirmation: ETT inserted through vocal cords under direct vision,  positive ETCO2 and breath sounds checked- equal and bilateral Secured at: 23 cm Tube secured with: Tape Dental Injury: Teeth and Oropharynx as per pre-operative assessment

## 2020-03-22 NOTE — Transfer of Care (Signed)
Immediate Anesthesia Transfer of Care Note  Patient: Leroi Haque Dilmore  Procedure(s) Performed: LEFT SHOULDER ARTHROSCOPY (Left Shoulder)  Patient Location: PACU  Anesthesia Type:General  Level of Consciousness: awake, alert  and oriented  Airway & Oxygen Therapy: Patient Spontanous Breathing and Patient connected to face mask oxygen  Post-op Assessment: Report given to RN and Post -op Vital signs reviewed and stable  Post vital signs: Reviewed and stable  Last Vitals:  Vitals Value Taken Time  BP 154/101 03/22/20 1606  Temp    Pulse 76 03/22/20 1608  Resp 21 03/22/20 1608  SpO2 100 % 03/22/20 1608  Vitals shown include unvalidated device data.  Last Pain:  Vitals:   03/22/20 1449  TempSrc:   PainSc: 0-No pain         Complications: No complications documented.

## 2020-03-22 NOTE — Anesthesia Postprocedure Evaluation (Signed)
Anesthesia Post Note  Patient: [name]  Procedure(s) Performed: LEFT SHOULDER ARTHROSCOPY,ACROMIOPLASTY, DEBRIDEMENT (Left Shoulder)     Patient location during evaluation: PACU Anesthesia Type: General Level of consciousness: awake and alert Pain management: pain level controlled Vital Signs Assessment: post-procedure vital signs reviewed and stable Respiratory status: spontaneous breathing, nonlabored ventilation and respiratory function stable Cardiovascular status: blood pressure returned to baseline and stable Postop Assessment: no apparent nausea or vomiting Anesthetic complications: no   No complications documented.  Last Vitals:  Vitals:   03/22/20 1630 03/22/20 1645  BP: (!) 147/99 (!) 142/94  Pulse: 72 64  Resp: 16 13  Temp:    SpO2: 95% 97%    Last Pain:  Vitals:   03/22/20 1645  TempSrc:   PainSc: 0-No pain                 Lucretia Kern

## 2020-03-22 NOTE — Anesthesia Procedure Notes (Signed)
Anesthesia Regional Block: Interscalene brachial plexus block   Pre-Anesthetic Checklist: ,, timeout performed, Correct Patient, Correct Site, Correct Laterality, Correct Procedure, Correct Position, site marked, Risks and benefits discussed,  Surgical consent,  Pre-op evaluation,  At surgeon's request and post-op pain management  Laterality: Left  Prep: chloraprep       Needles:  Injection technique: Single-shot  Needle Type: Echogenic Stimulator Needle     Needle Length: 10cm  Needle Gauge: 20     Additional Needles:   Procedures:,,,, ultrasound used (permanent image in chart),,,,  Narrative:  Start time: 03/22/2020 2:40 PM End time: 03/22/2020 2:46 PM Injection made incrementally with aspirations every 5 mL.  Performed by: Personally  Anesthesiologist: Lucretia Kern, MD  Additional Notes: Standard monitors applied. Skin prepped. Good needle visualization with ultrasound. Injection made in 5cc increments with no resistance to injection. Patient tolerated the procedure well.

## 2020-03-22 NOTE — Op Note (Signed)
[name] [id number] 03/22/2020   PRE-OP DIAGNOSIS: left sh imp and partial RCT  POST-OP DIAGNOSIS: same  PROCEDURE: right sh scope aply and deb  ANESTHESIA: general and block  Velna Ochs   Dictation #:  [id number]

## 2020-03-22 NOTE — Interval H&P Note (Signed)
History and Physical Interval Note:  03/22/2020 2:24 PM  [name]  has presented today for surgery, with the diagnosis of LEFT SHOULDER PAIN.  The various methods of treatment have been discussed with the patient and family. After consideration of risks, benefits and other options for treatment, the patient has consented to  Procedure(s): LEFT SHOULDER ARTHROSCOPY (Left) as a surgical intervention.  The patient's history has been reviewed, patient examined, no change in status, stable for surgery.  I have reviewed the patient's chart and labs.  Questions were answered to the patient's satisfaction.     Velna Ochs

## 2020-03-22 NOTE — Anesthesia Preprocedure Evaluation (Addendum)
Anesthesia Evaluation  Patient identified by MRN, date of birth, ID band Patient awake    Reviewed: Allergy & Precautions, NPO status , Patient's Chart, lab work & pertinent test results  History of Anesthesia Complications Negative for: history of anesthetic complications  Airway Mallampati: II  TM Distance: >3 FB Neck ROM: Full    Dental  (+) Teeth Intact   Pulmonary neg pulmonary ROS,    Pulmonary exam normal        Cardiovascular hypertension, Pt. on medications Normal cardiovascular exam     Neuro/Psych  Headaches, negative psych ROS   GI/Hepatic Neg liver ROS, GERD  ,  Endo/Other  Hypothyroidism   Renal/GU Renal InsufficiencyRenal disease (CKDIII; Cr 1.95)  negative genitourinary   Musculoskeletal  (+) narcotic dependent  Abdominal   Peds  Hematology negative hematology ROS (+)   Anesthesia Other Findings   Reproductive/Obstetrics                            Anesthesia Physical Anesthesia Plan  ASA: II  Anesthesia Plan: General   Post-op Pain Management: GA combined w/ Regional for post-op pain   Induction: Intravenous  PONV Risk Score and Plan: 2 and Ondansetron, Dexamethasone, Treatment may vary due to age or medical condition and Midazolam  Airway Management Planned: Oral ETT  Additional Equipment: None  Intra-op Plan:   Post-operative Plan: Extubation in OR  Informed Consent: I have reviewed the patients History and Physical, chart, labs and discussed the procedure including the risks, benefits and alternatives for the proposed anesthesia with the patient or authorized representative who has indicated his/her understanding and acceptance.     Dental advisory given  Plan Discussed with:   Anesthesia Plan Comments:         Anesthesia Quick Evaluation

## 2020-03-23 ENCOUNTER — Encounter (HOSPITAL_COMMUNITY): Payer: Self-pay | Admitting: Orthopaedic Surgery

## 2020-03-23 NOTE — Op Note (Signed)
NAMEBARUC, [name] [id number] ACCOUNT [id number] DATE OF BIRTH:02-18-1961 FACILITY: [facility] LOCATION: [facility] PHYSICIAN:Jasmynn Pfalzgraf Janann Colonel, MD  OPERATIVE REPORT  DATE OF PROCEDURE:  03/22/2020  PREOPERATIVE DIAGNOSES: 1.  Left shoulder impingement. 2.  Left shoulder partial rotator cuff tear.  POSTOPERATIVE DIAGNOSES: 1.  Left shoulder impingement. 2.  Left shoulder partial rotator cuff tear.  PROCEDURE: 1.  Left shoulder arthroscopic acromioplasty. 2.  Left shoulder arthroscopic debridement.  ANESTHESIA:  General and block.  ATTENDING SURGEON:  Dr. Marcene Corning.  ASSISTANT:  Elodia Florence, PA.  INDICATIONS:  The patient is a 59 year old man with a very long history of left shoulder pain.  This has responded to subacromial injection multiple times in the past.  He has also been through physical therapy.  He persisted with pain with use of his  arm and when trying to rest.  By MRI scan, he has a high-grade partial tear of the rotator cuff.  He is offered an arthroscopy.  Informed operative consent was obtained after discussion of possible complications including reaction to anesthesia and  infection.  SUMMARY OF FINDINGS AND PROCEDURE:  Under general anesthesia and a block, a left shoulder arthroscopy was performed.  The glenohumeral joint showed no degenerative change and the biceps tendon was partially torn.  A debridement was done to stable tissues  and about 90% of the structure appeared normal, so it was spared.  The rotator cuff appeared normal from below.  In the subacromial space, he had an authentic bursal aspect partial cuff tear, but it did not constitute more than about 10% or 15% of the  thickness of the rotator cuff and a debridement was done through the supraspinatus and infraspinatus and through the subacromial space.  This was an extensive debridement.  He did have a fairly prominent subacromial morphology dressed with an  acromioplasty  back to a flat surface.  He was scheduled to go home the same day.  DESCRIPTION OF PROCEDURE:  The patient was taken to the operating suite where general anesthetic was applied without difficulty.  He was also given a block in the preanesthesia area.  He was positioned in beach chair position and prepped and draped in  normal sterile fashion.  After the administration of preoperative IV vancomycin and an appropriate time-out, an arthroscopy of the left shoulder was performed through a total of 3 portals.  Findings were as noted above and procedure consisted initially  of the biceps tendon, debridement back to stable tissues.  We then performed an extensive cuff debridement was seen in the subacromial space, but again no tear worthy of repair was found.  He had a fairly prominent subacromial morphology.  An  acromioplasty was done with the bur in the lateral position followed by transfer of the bur to the posterior position.  The shoulder was thoroughly irrigated, followed by reapproximation of the portals loosely with nylon.  Adaptic was applied followed by  dry gauze and tape.  Estimated blood loss and intraoperative fluids can obtain from anesthesia records.  DISPOSITION:  The patient was extubated in the operating room and taken to recovery room in stable condition.  Plans were for him to go home same day and follow up in the office next week.  I will contact him by phone tonight.  HN/NUANCE  D:03/22/2020 T:03/23/2020 JOB:[id number]

## 2020-04-03 DIAGNOSIS — N200 Calculus of kidney: Secondary | ICD-10-CM | POA: Diagnosis not present

## 2020-04-11 ENCOUNTER — Inpatient Hospital Stay (HOSPITAL_COMMUNITY): Payer: Commercial Managed Care - PPO | Admitting: Certified Registered Nurse Anesthetist

## 2020-04-11 ENCOUNTER — Ambulatory Visit (HOSPITAL_COMMUNITY)
Admission: AD | Admit: 2020-04-11 | Discharge: 2020-04-11 | Disposition: A | Discharge status: Home / Self Care | Payer: Commercial Managed Care - PPO | Source: Ambulatory Visit | Attending: Urology | Admitting: Urology

## 2020-04-11 ENCOUNTER — Encounter (HOSPITAL_COMMUNITY): Payer: Self-pay | Admitting: Urology

## 2020-04-11 ENCOUNTER — Encounter (HOSPITAL_COMMUNITY)
Admission: AD | Disposition: A | Discharge status: Home / Self Care | Payer: Self-pay | Source: Ambulatory Visit | Attending: Urology

## 2020-04-11 ENCOUNTER — Other Ambulatory Visit: Payer: Self-pay

## 2020-04-11 ENCOUNTER — Other Ambulatory Visit: Payer: Self-pay | Admitting: Urology

## 2020-04-11 DIAGNOSIS — Z20822 Contact with and (suspected) exposure to covid-19: Secondary | ICD-10-CM | POA: Insufficient documentation

## 2020-04-11 DIAGNOSIS — Z882 Allergy status to sulfonamides status: Secondary | ICD-10-CM | POA: Insufficient documentation

## 2020-04-11 DIAGNOSIS — N201 Calculus of ureter: Secondary | ICD-10-CM | POA: Insufficient documentation

## 2020-04-11 DIAGNOSIS — Z880 Allergy status to penicillin: Secondary | ICD-10-CM | POA: Diagnosis not present

## 2020-04-11 DIAGNOSIS — Z888 Allergy status to other drugs, medicaments and biological substances status: Secondary | ICD-10-CM | POA: Insufficient documentation

## 2020-04-11 DIAGNOSIS — Z885 Allergy status to narcotic agent status: Secondary | ICD-10-CM | POA: Insufficient documentation

## 2020-04-11 HISTORY — PX: CYSTOSCOPY W/ URETERAL STENT PLACEMENT: SHX1429

## 2020-04-11 LAB — CBC
HCT: 47 % (ref 39.0–52.0)
Hemoglobin: 15.9 g/dL (ref 13.0–17.0)
MCH: 30.3 pg (ref 26.0–34.0)
MCHC: 33.8 g/dL (ref 30.0–36.0)
MCV: 89.5 fL (ref 80.0–100.0)
Platelets: 295 10*3/uL (ref 150–400)
RBC: 5.25 MIL/uL (ref 4.22–5.81)
RDW: 12 % (ref 11.5–15.5)
WBC: 8.7 10*3/uL (ref 4.0–10.5)
nRBC: 0 % (ref 0.0–0.2)

## 2020-04-11 LAB — BASIC METABOLIC PANEL
Anion gap: 12 (ref 5–15)
BUN: 32 mg/dL — ABNORMAL HIGH (ref 8–23)
CO2: 23 mmol/L (ref 22–32)
Calcium: 9.5 mg/dL (ref 8.9–10.3)
Chloride: 103 mmol/L (ref 98–111)
Creatinine, Ser: 1.74 mg/dL — ABNORMAL HIGH (ref 0.61–1.24)
GFR, Estimated: 44 mL/min — ABNORMAL LOW (ref >60)
Glucose, Bld: 99 mg/dL (ref 70–99)
Potassium: 3.5 mmol/L (ref 3.5–5.1)
Sodium: 138 mmol/L (ref 135–145)

## 2020-04-11 LAB — SARS CORONAVIRUS 2 BY RT PCR (HOSPITAL ORDER, PERFORMED IN ~~LOC~~ HOSPITAL LAB): SARS Coronavirus 2: NEGATIVE

## 2020-04-11 SURGERY — CYSTOSCOPY, WITH RETROGRADE PYELOGRAM AND URETERAL STENT INSERTION
Anesthesia: General | Laterality: Left

## 2020-04-11 MED ORDER — ACETAMINOPHEN 10 MG/ML IV SOLN
INTRAVENOUS | Status: AC
  Filled 2020-04-11: qty 100

## 2020-04-11 MED ORDER — CIPROFLOXACIN HCL 500 MG PO TABS: 500.0000 mg | ORAL_TABLET | Freq: Two times a day (BID) | ORAL | 0 refills | Status: AC

## 2020-04-11 MED ORDER — ACETAMINOPHEN 10 MG/ML IV SOLN
1000.0000 mg | Freq: Once | INTRAVENOUS | Status: DC | PRN
  Administered 2020-04-11: 1000 mg via INTRAVENOUS

## 2020-04-11 MED ORDER — PROPOFOL 10 MG/ML IV BOLUS
INTRAVENOUS | Status: DC | PRN
  Administered 2020-04-11: 200 mg via INTRAVENOUS

## 2020-04-11 MED ORDER — EPHEDRINE SULFATE-NACL 50-0.9 MG/10ML-% IV SOSY
PREFILLED_SYRINGE | INTRAVENOUS | Status: DC | PRN
  Administered 2020-04-11: 10 mg via INTRAVENOUS
  Administered 2020-04-11: 5 mg via INTRAVENOUS
  Administered 2020-04-11: 15 mg via INTRAVENOUS

## 2020-04-11 MED ORDER — OXYCODONE HCL 5 MG/5ML PO SOLN: 5.0000 mg | Freq: Once | ORAL | Status: DC | PRN

## 2020-04-11 MED ORDER — 0.9 % SODIUM CHLORIDE (POUR BTL) OPTIME
TOPICAL | Status: DC | PRN
  Administered 2020-04-11: 1000 mL

## 2020-04-11 MED ORDER — ONDANSETRON HCL 4 MG/2ML IJ SOLN
INTRAMUSCULAR | Status: DC | PRN
  Administered 2020-04-11: 4 mg via INTRAVENOUS

## 2020-04-11 MED ORDER — GENTAMICIN SULFATE 40 MG/ML IJ SOLN
5.0000 mg/kg | Freq: Once | INTRAVENOUS | Status: AC
  Administered 2020-04-11: 470 mg via INTRAVENOUS
  Filled 2020-04-11: qty 11.75

## 2020-04-11 MED ORDER — FENTANYL CITRATE (PF) 100 MCG/2ML IJ SOLN: 25.0000 ug | INTRAMUSCULAR | Status: DC | PRN

## 2020-04-11 MED ORDER — ONDANSETRON HCL 4 MG/2ML IJ SOLN: 4.0000 mg | Freq: Once | INTRAMUSCULAR | Status: DC | PRN

## 2020-04-11 MED ORDER — SODIUM CHLORIDE 0.9 % IR SOLN
Status: DC | PRN
  Administered 2020-04-11: 1000 mL

## 2020-04-11 MED ORDER — LACTATED RINGERS IV SOLN: INTRAVENOUS | Status: DC

## 2020-04-11 MED ORDER — MORPHINE SULFATE 15 MG PO TABS: 7.5000 mg | ORAL_TABLET | ORAL | 0 refills | Status: DC | PRN

## 2020-04-11 MED ORDER — DEXAMETHASONE SODIUM PHOSPHATE 10 MG/ML IJ SOLN
INTRAMUSCULAR | Status: DC | PRN
  Administered 2020-04-11: 10 mg via INTRAVENOUS

## 2020-04-11 MED ORDER — IOHEXOL 300 MG/ML  SOLN
INTRAMUSCULAR | Status: DC | PRN
  Administered 2020-04-11: 6 mL

## 2020-04-11 MED ORDER — FENTANYL CITRATE (PF) 100 MCG/2ML IJ SOLN
INTRAMUSCULAR | Status: DC | PRN
  Administered 2020-04-11: 100 ug via INTRAVENOUS

## 2020-04-11 MED ORDER — CHLORHEXIDINE GLUCONATE 0.12 % MT SOLN
15.0000 mL | Freq: Once | OROMUCOSAL | Status: AC
  Administered 2020-04-11: 15 mL via OROMUCOSAL

## 2020-04-11 MED ORDER — FENTANYL CITRATE (PF) 100 MCG/2ML IJ SOLN
INTRAMUSCULAR | Status: AC
  Filled 2020-04-11: qty 2

## 2020-04-11 MED ORDER — ORAL CARE MOUTH RINSE: 15.0000 mL | Freq: Once | OROMUCOSAL | Status: AC

## 2020-04-11 MED ORDER — MIDAZOLAM HCL 2 MG/2ML IJ SOLN
INTRAMUSCULAR | Status: AC
  Filled 2020-04-11: qty 2

## 2020-04-11 MED ORDER — MIDAZOLAM HCL 2 MG/2ML IJ SOLN
INTRAMUSCULAR | Status: DC | PRN
  Administered 2020-04-11: 2 mg via INTRAVENOUS

## 2020-04-11 MED ORDER — OXYCODONE HCL 5 MG PO TABS
5.0000 mg | ORAL_TABLET | Freq: Once | ORAL | Status: DC | PRN
Start: 2020-04-11 — End: 2020-04-12

## 2020-04-11 MED ORDER — PROPOFOL 10 MG/ML IV BOLUS
INTRAVENOUS | Status: AC
  Filled 2020-04-11: qty 20

## 2020-04-11 MED ORDER — LIDOCAINE 2% (20 MG/ML) 5 ML SYRINGE
INTRAMUSCULAR | Status: DC | PRN
  Administered 2020-04-11: 100 mg via INTRAVENOUS

## 2020-04-11 SURGICAL SUPPLY — 14 items
BAG URO CATCHER STRL LF (MISCELLANEOUS) ×2 IMPLANT
CATH URET 5FR 28IN OPEN ENDED (CATHETERS) ×2 IMPLANT
CLOTH BEACON ORANGE TIMEOUT ST (SAFETY) ×2 IMPLANT
GLOVE BIOGEL M 7.0 STRL (GLOVE) ×2 IMPLANT
GOWN STRL REUS W/TWL LRG LVL3 (GOWN DISPOSABLE) ×4 IMPLANT
GUIDEWIRE STR DUAL SENSOR (WIRE) ×1 IMPLANT
GUIDEWIRE ZIPWRE .038 STRAIGHT (WIRE) ×2 IMPLANT
KIT TURNOVER KIT A (KITS) IMPLANT
MANIFOLD NEPTUNE II (INSTRUMENTS) ×2 IMPLANT
PACK CYSTO (CUSTOM PROCEDURE TRAY) ×2 IMPLANT
STENT URET 6FRX26 CONTOUR (STENTS) ×1 IMPLANT
SYR 10ML LL (SYRINGE) ×2 IMPLANT
TUBING CONNECTING 10 (TUBING) ×2 IMPLANT
TUBING UROLOGY SET (TUBING) ×1 IMPLANT

## 2020-04-11 NOTE — Op Note (Signed)
Operative Note  Preoperative diagnosis:  1.  Left ureteral stone  Postoperative diagnosis: 1.  Left ureteral stone  Procedure(s): 1.  Cystoscopy 2. Left retrograde pyelogram with interpretation 3. Left ureteral stent placement  Surgeon: Jettie Pagan, MD  Assistants:  None  Anesthesia:  General  Complications:  None  EBL:  Minimal  Specimens: 1.  ID Type Source Tests Collected by Time Destination  A : left renal pelvis urine culture Tissue PATH Other URINE CULTURE Jannifer Hick, MD 04/11/2020 1809     Drains/Catheters: 1.  Left 6Fr x 26cm ureteral stent  Intraoperative findings:   1. Cystoscopy demonstrated no suspicious lesions, masses, stones or other pathology. 2. LeftRetrograde pyelogram demonstrated moderate left hydronephrosis. 3. Successful left ureteral stent placement with curl in the renal pelvis and bladder respectively.  Indication:  Kaizen Ibsen Tirone is a 60 y.o. male with obstructing distal left ureteral stone. After reviewing the management options for treatment, he elected to proceed with the above surgical procedure(s). We have discussed the potential benefits and risks of the procedure, side effects of the proposed treatment, the likelihood of the patient achieving the goals of the procedure, and any potential problems that might occur during the procedure or recuperation. Informed consent has been obtained.  Description of procedure: The patient was taken to the operating room and general anesthesia was induced.  The patient was placed in the dorsal lithotomy position, prepped and draped in the usual sterile fashion, and preoperative antibiotics were administered. A preoperative time-out was performed.   Cystourethroscopy was performed.  The patient's urethra was examined. There was a wide caliber bulbar urethral stenosis which was navigated without difficulty. There was some bilobar prostatic hypertrophy. The bladder was then systematically examined in  its entirety. There was no evidence for any bladder tumors, stones, or other mucosal pathology.    Attention then turned to the left ureteral orifice. A 0.038 zip wire was passed through the left orifice and over the wire a 5 Fr open ended catheter was inserted and passed up to the level of the renal pelvis. Aspirate was obtained and sent off as left renal pelvis urine for culture. Omnipaque contrast was injected through the ureteral catheter and a retrograde pyelogram was performed with findings as dictated above. The wire was then replaced and the open ended catheter was removed.   A 6Fr x 26cm ureteral stent was advance over the wire. The stent was positioned appropriately under fluoroscopic and cystoscopic guidance.  The wire was then removed with an adequate stent curl noted in the left upper pole as well as in the bladder.  The bladder was then emptied and the procedure ended.  The patient appeared to tolerate the procedure well and without complications.  The patient was able to be awakened and transferred to the recovery unit in satisfactory condition.   Plan:  Discharge home. F/u to schedule left URS/LL.  Matt R. Tadd Holtmeyer MD [facility]  Pager: [phone]

## 2020-04-11 NOTE — Anesthesia Preprocedure Evaluation (Signed)
Anesthesia Evaluation  Patient identified by MRN, date of birth, ID band Patient awake    Reviewed: Allergy & Precautions, H&P , NPO status , Patient's Chart, lab work & pertinent test results  Airway Mallampati: II  TM Distance: >3 FB Neck ROM: Full    Dental no notable dental hx.    Pulmonary neg pulmonary ROS,    Pulmonary exam normal breath sounds clear to auscultation       Cardiovascular hypertension, Pt. on medications Normal cardiovascular exam Rhythm:Regular Rate:Normal     Neuro/Psych negative neurological ROS  negative psych ROS   GI/Hepatic Neg liver ROS, GERD  ,  Endo/Other  Hypothyroidism   Renal/GU Renal disease  negative genitourinary   Musculoskeletal negative musculoskeletal ROS (+)   Abdominal   Peds negative pediatric ROS (+)  Hematology negative hematology ROS (+)   Anesthesia Other Findings   Reproductive/Obstetrics negative OB ROS                             Anesthesia Physical Anesthesia Plan  ASA: II  Anesthesia Plan: General   Post-op Pain Management:    Induction: Intravenous  PONV Risk Score and Plan: 2 and Ondansetron, Dexamethasone and Treatment may vary due to age or medical condition  Airway Management Planned: LMA  Additional Equipment:   Intra-op Plan:   Post-operative Plan: Extubation in OR  Informed Consent: I have reviewed the patients History and Physical, chart, labs and discussed the procedure including the risks, benefits and alternatives for the proposed anesthesia with the patient or authorized representative who has indicated his/her understanding and acceptance.     Dental advisory given  Plan Discussed with: CRNA and Surgeon  Anesthesia Plan Comments:         Anesthesia Quick Evaluation

## 2020-04-11 NOTE — H&P (Signed)
Urology Preoperative H&P   Chief Complaint: Left flank pain  History of Present Illness: [name] is a 60 y.o. male who presents with acute onset of left flank pain that began this morning.  He is writhing in pain.  He denies fevers or chills.  He has had some nausea and emesis.  He has had some increased urinary frequency and urgency today.  He initially presented to the ED on 03/11/2020 with acute left flank pain. CT A/P demonstrated a punctate 2-3 mm calculus just proximal to the left UVJ. There is also superimposed left nephrolithiasis, 4-5 mm chronic benign renal cysts and left side parapelvic cyst.   He thought he was able to pass the stone and brought a stone in for analysis.  His pain recurred this morning.  CT A/P was obtained on 04/11/2020 which demonstrated some moderate left-sided hydronephrosis with perinephric and periureteral stranding with a punctate calcification at the left UVJ.  He desires left ureteral stent placement.  He does have a history of urolithiasis and states he has had at least 4 times of medical expulsive therapy. He denies any procedures to treat his stones.   He does state he has chronic lower urinary tract symptoms including sensation incomplete bladder emptying, frequency, intermittency, urgency, weak flow stream, straining to void and for time nocturia. IPSS score is 17, Q OL 3. He denies taking medical therapy for this prior.    Past Medical History:  Diagnosis Date  . ADHD   . Arthritis   . Chronic kidney disease   . GERD (gastroesophageal reflux disease)   . Heart murmur   . History of kidney stones   . Hypertension   . Hypothyroidism   . Migraine headache without aura     Past Surgical History:  Procedure Laterality Date  . COLONOSCOPY    . HERNIA REPAIR  2010   Bilateral inguinal  . KNEE ARTHROSCOPY    . SHOULDER ARTHROSCOPY Left 03/22/2020   Procedure: LEFT SHOULDER ARTHROSCOPY,ACROMIOPLASTY, DEBRIDEMENT;  Surgeon: Marcene Corning, MD;  Location: [facility];  Service: Orthopedics;  Laterality: Left;  . TONSILLECTOMY    . WISDOM TOOTH EXTRACTION      Allergies:  Allergies  Allergen Reactions  . Benazepril Hcl Anaphylaxis and Swelling  . Other Swelling    oysters  . Hydrocodone-Acetaminophen Nausea And Vomiting  . Sulfa Antibiotics Diarrhea and Other (See Comments)  . Penicillins Other (See Comments)    Unknown    Family History  Problem Relation Age of Onset  . Mental illness Mother   . Cancer Paternal Aunt        lung  . Cancer Paternal Grandmother        lung  . Diabetes Neg Hx     Social History:  reports that he has never smoked. He has never used smokeless tobacco. He reports current alcohol use. He reports that he does not use drugs.  ROS: A complete review of systems was performed.  All systems are negative except for pertinent findings as noted.  Physical Exam:  Vital signs in last 24 hours:   Constitutional:  Alert and oriented, No acute distress Cardiovascular: Regular rate and rhythm Respiratory: Normal respiratory effort, Lungs clear bilaterally GI: Abdomen is soft, nontender, nondistended, no abdominal masses GU: No CVA tenderness Lymphatic: No lymphadenopathy Neurologic: Grossly intact, no focal deficits Psychiatric: Normal mood and affect  Laboratory Data:  No results for input(s): WBC, HGB, HCT, PLT in the last 72 hours.  No results  for input(s): NA, K, CL, GLUCOSE, BUN, CALCIUM, CREATININE in the last 72 hours.  Invalid input(s): CO3   No results found for this or any previous visit (from the past 24 hour(s)). No results found for this or any previous visit (from the past 240 hour(s)).  Renal Function: No results for input(s): CREATININE in the last 168 hours. CrCl cannot be calculated (Patient's most recent lab result is older than the maximum 21 days allowed.).  Radiologic Imaging: No results found.  I independently reviewed the above imaging  studies.  Assessment and Plan [name] is a 60 y.o. male with obstructing left ureteral stone here for cystoscopy, left retrograde pyelogram, left ureteral stent placement.  -The risks, benefits and alternatives of cystoscopy with left JJ stent placement was discussed with the patient.  Risks include, but are not limited to: bleeding, urinary tract infection, ureteral injury, ureteral stricture disease, chronic pain, urinary symptoms, bladder injury, stent migration, the need for nephrostomy tube placement, MI, CVA, DVT, PE and the inherent risks with general anesthesia.  The patient voices understanding and wishes to proceed.   Matt R. Shakema Surita MD 04/11/2020, 4:53 PM  [facility] Pager: [phone]): [phone]

## 2020-04-11 NOTE — Anesthesia Procedure Notes (Signed)
Date/Time: 04/11/2020 6:22 PM Performed by: Minerva Ends, CRNA Oxygen Delivery Method: Simple face mask Placement Confirmation: positive ETCO2 and breath sounds checked- equal and bilateral Dental Injury: Teeth and Oropharynx as per pre-operative assessment

## 2020-04-11 NOTE — Anesthesia Procedure Notes (Signed)
Procedure Name: LMA Insertion Date/Time: 04/11/2020 5:53 PM Performed by: Minerva Ends, CRNA Pre-anesthesia Checklist: Patient identified, Emergency Drugs available, Suction available and Patient being monitored Patient Re-evaluated:Patient Re-evaluated prior to induction Oxygen Delivery Method: Circle System Utilized Preoxygenation: Pre-oxygenation with 100% oxygen Induction Type: IV induction Ventilation: Mask ventilation without difficulty LMA: LMA inserted LMA Size: 4.0 Number of attempts: 1 Airway Equipment and Method: Bite block Placement Confirmation: positive ETCO2 Tube secured with: Tape Dental Injury: Teeth and Oropharynx as per pre-operative assessment  Comments: Smooth IV induction Rose-- LMA insertion- AM CRNA atraumatic -- let front tooth with slight chipping- unchanged with LMA insertion- bilat BS

## 2020-04-11 NOTE — Transfer of Care (Signed)
Immediate Anesthesia Transfer of Care Note  Patient: [name]  Procedure(s) Performed: CYSTOSCOPY WITH RETROGRADE PYELOGRAM/URETERAL STENT PLACEMENT (Left )  Patient Location: PACU  Anesthesia Type:General  Level of Consciousness: sedated  Airway & Oxygen Therapy: Patient Spontanous Breathing and Patient connected to face mask oxygen  Post-op Assessment: Report given to RN and Post -op Vital signs reviewed and stable  Post vital signs: Reviewed and stable  Last Vitals:  Vitals Value Taken Time  BP 115/73 04/11/20 1828  Temp    Pulse 73 04/11/20 1830  Resp 16 04/11/20 1830  SpO2 100 % 04/11/20 1830  Vitals shown include unvalidated device data.  Last Pain:  Vitals:   04/11/20 1653  TempSrc: Oral  PainSc: 0-No pain         Complications: No complications documented.

## 2020-04-11 NOTE — Discharge Instructions (Signed)
General Anesthesia, Adult, Care After This sheet gives you information about how to care for yourself after your procedure. Your health care provider may also give you more specific instructions. If you have problems or questions, contact your health care provider. What can I expect after the procedure? After the procedure, the following side effects are common:  Pain or discomfort at the IV site.  Nausea.  Vomiting.  Sore throat.  Trouble concentrating.  Feeling cold or chills.  Weak or tired.  Sleepiness and fatigue.  Soreness and body aches. These side effects can affect parts of the body that were not involved in surgery. Follow these instructions at home:  For at least 24 hours after the procedure:  Have a responsible adult stay with you. It is important to have someone help care for you until you are awake and alert.  Rest as needed.  Do not: ? Participate in activities in which you could fall or become injured. ? Drive. ? Use heavy machinery. ? Drink alcohol. ? Take sleeping pills or medicines that cause drowsiness. ? Make important decisions or sign legal documents. ? Take care of children on your own. Eating and drinking  Follow any instructions from your health care provider about eating or drinking restrictions.  When you feel hungry, start by eating small amounts of foods that are soft and easy to digest (bland), such as toast. Gradually return to your regular diet.  Drink enough fluid to keep your urine pale yellow.  If you vomit, rehydrate by drinking water, juice, or clear broth. General instructions  If you have sleep apnea, surgery and certain medicines can increase your risk for breathing problems. Follow instructions from your health care provider about wearing your sleep device: ? Anytime you are sleeping, including during daytime naps. ? While taking prescription pain medicines, sleeping medicines, or medicines that make you drowsy.  Return to  your normal activities as told by your health care provider. Ask your health care provider what activities are safe for you.  Take over-the-counter and prescription medicines only as told by your health care provider.  If you smoke, do not smoke without supervision.  Keep all follow-up visits as told by your health care provider. This is important. Contact a health care provider if:  You have nausea or vomiting that does not get better with medicine.  You cannot eat or drink without vomiting.  You have pain that does not get better with medicine.  You are unable to pass urine.  You develop a skin rash.  You have a fever.  You have redness around your IV site that gets worse. Get help right away if:  You have difficulty breathing.  You have chest pain.  You have blood in your urine or stool, or you vomit blood. Summary  After the procedure, it is common to have a sore throat or nausea. It is also common to feel tired.  Have a responsible adult stay with you for the first 24 hours after general anesthesia. It is important to have someone help care for you until you are awake and alert.  When you feel hungry, start by eating small amounts of foods that are soft and easy to digest (bland), such as toast. Gradually return to your regular diet.  Drink enough fluid to keep your urine pale yellow.  Return to your normal activities as told by your health care provider. Ask your health care provider what activities are safe for you. This information is not   intended to replace advice given to you by your health care provider. Make sure you discuss any questions you have with your health care provider. Document Revised: 03/26/2017 Document Reviewed: 11/06/2016 Elsevier Patient Education  2020 [employer]. Activity:  You are encouraged to ambulate frequently (about every hour during waking hours) to help prevent blood clots from forming in your legs or lungs.    Diet: You should  advance your diet as instructed by your physician.  It will be normal to have some bloating, nausea, and abdominal discomfort intermittently.   Prescriptions:  You will be provided a prescription for pain medication to take as needed.  If your pain is not severe enough to require the prescription pain medication, you may take extra strength Tylenol instead which will have less side effects.  You should also take a prescribed stool softener to avoid straining with bowel movements as the prescription pain medication may constipate you.  What to call us about: You should call the office [phone]) if you develop fever > 101 or develop persistent vomiting.

## 2020-04-12 ENCOUNTER — Encounter (HOSPITAL_COMMUNITY): Payer: Self-pay | Admitting: Urology

## 2020-04-12 NOTE — Anesthesia Postprocedure Evaluation (Signed)
Anesthesia Post Note  Patient: [name]  Procedure(s) Performed: CYSTOSCOPY WITH RETROGRADE PYELOGRAM/URETERAL STENT PLACEMENT (Left )     Patient location during evaluation: PACU Anesthesia Type: General Level of consciousness: awake and alert Pain management: pain level controlled Vital Signs Assessment: post-procedure vital signs reviewed and stable Respiratory status: spontaneous breathing, nonlabored ventilation, respiratory function stable and patient connected to nasal cannula oxygen Cardiovascular status: blood pressure returned to baseline and stable Postop Assessment: no apparent nausea or vomiting Anesthetic complications: no   No complications documented.  Last Vitals:  Vitals:   04/11/20 1915 04/11/20 2030  BP: (!) 133/91 121/88  Pulse: 79 88  Resp: 17 18  Temp:  (!) 36.3 C  SpO2: 93% 95%    Last Pain:  Vitals:   04/11/20 2030  TempSrc:   PainSc: 0-No pain                 Garritt Molyneux S

## 2020-04-12 NOTE — Discharge Summary (Signed)
Date of admission: 04/11/2020  Date of discharge: 04/12/2020  Admission diagnosis: Left ureteral stone  Discharge diagnosis: Left ureteral stone  Secondary diagnoses: none  History and Physical: For full details, please see admission history and physical. Briefly, [name] is a 60 y.o. year old patient with left ureteral stone and pain.   Hospital Course: The patient recovered in the usual expected fashion.  He had his diet advanced slowly.  Initially managed with IV pain control, then transitioned to PO meds when he was tolerating oral intake.  His labs were stable throughout the hospital course.  He was discharged to home on POD#0.  At the time of discharge the patient was tolerating a regular diet, passing flatus, ambulating, had adequate pain control and was agreeable to discharge.  Follow up as scheduled.    Laboratory values:  Recent Labs    04/11/20 1700  HGB 15.9  HCT 47.0   Recent Labs    04/11/20 1700  CREATININE 1.74*    Disposition: Home  Discharge instruction: The patient was instructed to be ambulatory but told to refrain from heavy lifting, strenuous activity, or driving.   Discharge medications:  Allergies as of 04/11/2020      Reactions   Benazepril Hcl Anaphylaxis, Swelling   Other Swelling   oysters   Hydrocodone-acetaminophen Nausea And Vomiting   Sulfa Antibiotics Diarrhea, Other (See Comments)   Penicillins Other (See Comments)   Unknown      Medication List    STOP taking these medications   HYDROcodone-acetaminophen 5-325 MG tablet Commonly known as: NORCO/VICODIN     TAKE these medications   aspirin 81 MG EC tablet Take 81 mg by mouth daily.   ciprofloxacin 500 MG tablet Commonly known as: Cipro Take 1 tablet (500 mg total) by mouth 2 (two) times daily for 3 days.   Flonase 50 MCG/ACT nasal spray Generic drug: fluticasone Place 2 sprays into both nostrils daily. What changed:   when to take this  reasons to take this    hydrochlorothiazide 25 MG tablet Commonly known as: HYDRODIURIL Take 1 tablet (25 mg total) by mouth daily.   irbesartan 75 MG tablet Commonly known as: AVAPRO TAKE 1 TABLET BY MOUTH EVERY DAY   levothyroxine 100 MCG tablet Commonly known as: SYNTHROID Take 1 tablet (100 mcg total) by mouth daily.   morphine 15 MG tablet Commonly known as: MSIR Take 0.5 tablets (7.5 mg total) by mouth every 4 (four) hours as needed for severe pain.   Multi-Vitamins Tabs Take 1 tablet by mouth daily.   multivitamin-lutein Caps capsule Take 1 capsule by mouth daily.   Nexletol 180 MG Tabs Generic drug: Bempedoic Acid Take 180 mg by mouth daily.   Omega-3 1000 MG Caps Take 1,000 mg by mouth daily.   ondansetron 4 MG disintegrating tablet Commonly known as: Zofran ODT 4mg  ODT q4 hours prn nausea/vomit What changed:   how much to take  how to take this  when to take this  reasons to take this  additional instructions   TURMERIC PO Take 1,000 mg by mouth daily.   Ubiquinol 100 MG Caps Take 100 mg by mouth daily.   vitamin C 1000 MG tablet Take 1,000 mg by mouth daily.   Vitamin D 50 MCG (2000 UT) Caps Take 2,000 Units by mouth daily.       Followup:   Follow-up Information    Schedule an appointment as soon as possible for a visit with [facility].  Contact information: [address] [place] [address] [phone]              Matt R. Brandace Cargle MD [facility]  Pager: [phone]

## 2020-04-13 LAB — URINE CULTURE: Culture: NO GROWTH

## 2020-04-17 ENCOUNTER — Other Ambulatory Visit: Payer: Self-pay | Admitting: Urology

## 2020-05-02 ENCOUNTER — Other Ambulatory Visit: Payer: Self-pay

## 2020-05-02 ENCOUNTER — Encounter (HOSPITAL_BASED_OUTPATIENT_CLINIC_OR_DEPARTMENT_OTHER): Payer: Self-pay | Admitting: Urology

## 2020-05-02 NOTE — Progress Notes (Signed)
Spoke w/ via phone for pre-op interview--- Pt Lab needs dos---- Istat              Lab results------ current ekg in epic/ chart COVID test ------ 05-03-2020 @ 0855 Arrive at ------- 1215 on 05-07-2020 NPO after MN NO Solid Food.  Clear liquids from MN until--- 115 Medications to take morning of surgery ----- Synthroid, Nexletol Diabetic medication ----- n/a Patient Special Instructions ----- n/a Pre-Op special Istructions ----- n/a Patient verbalized understanding of instructions that were given at this phone interview. Patient denies shortness of breath, chest pain, fever, cough at this phone interview.

## 2020-05-03 ENCOUNTER — Other Ambulatory Visit (HOSPITAL_COMMUNITY)
Admission: RE | Admit: 2020-05-03 | Discharge: 2020-05-03 | Disposition: A | Discharge status: Home / Self Care | Payer: Commercial Managed Care - PPO | Source: Ambulatory Visit | Attending: Urology | Admitting: Urology

## 2020-05-03 DIAGNOSIS — U071 COVID-19: Secondary | ICD-10-CM | POA: Diagnosis not present

## 2020-05-03 DIAGNOSIS — Z8616 Personal history of COVID-19: Secondary | ICD-10-CM

## 2020-05-03 DIAGNOSIS — Z01812 Encounter for preprocedural laboratory examination: Secondary | ICD-10-CM | POA: Insufficient documentation

## 2020-05-03 HISTORY — DX: Personal history of COVID-19: Z86.16

## 2020-05-03 LAB — SARS CORONAVIRUS 2 (TAT 6-24 HRS): SARS Coronavirus 2: POSITIVE — AB

## 2020-05-03 NOTE — Progress Notes (Signed)
Received call from front desk at [facility] that pt covid test come back positive.  Called and left message with OR scheduler, Selita, informed her about pt covid positive and case has been moved to depot until it gets rescheduled.  Called and spoke w/ pt via phone informed him that his covid test is positive.  Pt verbalized understanding that he is to continue quarantining, call his pcp office to inform them of positive covid test then they will instruct him about what to do if he becomes symptomatic, and if becomes sob go to ED.  Called and left message with [facility] answering service to inform Dr Cardell Peach of this pt's positive covid test which had surgery scheduled on 05-06-2020 @ 0730 and that pt had been called and message left with [facility], told that Dr Cardell Peach will be paged and informed.

## 2020-05-04 ENCOUNTER — Telehealth: Payer: Self-pay | Admitting: Adult Health

## 2020-05-04 NOTE — Progress Notes (Signed)
Patient with positive covid result. MD aware of results   

## 2020-05-04 NOTE — Telephone Encounter (Signed)
Called to discuss with patient about COVID-19 symptoms and the use of one of the available treatments for those with mild to moderate Covid symptoms and at a high risk of hospitalization.  Pt appears to qualify for outpatient treatment due to co-morbid conditions and/or a member of an at-risk group in accordance with the FDA Emergency Use Authorization.    Symptom onset:No Sx  Vaccinated: Yes  Booster? Yes  Immunocompromised? No  Qualifiers: CKD   Unable to reach pt - Reached patient 05/04/2020, Asymptomatic , was tested for pre-procedure . Given Hotline number if sx develop.   Keith Yang NP-C

## 2020-05-06 ENCOUNTER — Ambulatory Visit (HOSPITAL_BASED_OUTPATIENT_CLINIC_OR_DEPARTMENT_OTHER): Admission: RE | Admit: 2020-05-06 | Payer: Commercial Managed Care - PPO | Source: Home / Self Care | Admitting: Urology

## 2020-05-06 HISTORY — DX: Presence of external hearing-aid: Z97.4

## 2020-05-06 HISTORY — DX: Calculus of ureter: N20.1

## 2020-05-06 HISTORY — DX: Presence of spectacles and contact lenses: Z97.3

## 2020-05-06 HISTORY — DX: Chronic kidney disease, stage 3 unspecified: N18.30

## 2020-05-06 HISTORY — DX: Cyst of kidney, acquired: N28.1

## 2020-05-06 HISTORY — DX: Urgency of urination: R39.15

## 2020-05-06 SURGERY — CYSTOSCOPY/URETEROSCOPY/HOLMIUM LASER/STENT PLACEMENT
Anesthesia: General | Laterality: Left

## 2020-05-13 ENCOUNTER — Other Ambulatory Visit: Payer: Self-pay | Admitting: Urology

## 2020-05-24 ENCOUNTER — Encounter (HOSPITAL_BASED_OUTPATIENT_CLINIC_OR_DEPARTMENT_OTHER): Payer: Self-pay | Admitting: Urology

## 2020-05-24 ENCOUNTER — Other Ambulatory Visit: Payer: Self-pay

## 2020-05-24 NOTE — Progress Notes (Signed)
Spoke w/ via phone for pre-op interview--- Pt Lab needs dos---- Istat              Lab results------ current ekg in epic/ chart;  And positive covid result 05-03-2020 in epic  COVID test ----  No test needed since pt is within 90 day window per policy.  Per pt had fever, throat ulcers, cough.  Fever resolved in 2 days, throat ulcers resolved 1 week, and cough resolved in 3 weeks.  Arrive at ------- 1145 on 05-27-2020 NPO after MN NO Solid Food.  Clear liquids from MN until--- 1045 Medications to take morning of surgery ----- Synthroid, Nexletol Diabetic medication ----- n/a Patient Special Instructions ----- n/a Pre-Op special Istructions ----- n/a Patient verbalized understanding of instructions that were given at this phone interview. Patient denies shortness of breath, chest pain, fever, cough at this phone interview.

## 2020-05-27 ENCOUNTER — Ambulatory Visit (HOSPITAL_BASED_OUTPATIENT_CLINIC_OR_DEPARTMENT_OTHER)
Admission: RE | Admit: 2020-05-27 | Discharge: 2020-05-27 | Disposition: A | Discharge status: Home / Self Care | Payer: Commercial Managed Care - PPO | Attending: Urology | Admitting: Urology

## 2020-05-27 ENCOUNTER — Other Ambulatory Visit: Payer: Self-pay

## 2020-05-27 ENCOUNTER — Ambulatory Visit (HOSPITAL_BASED_OUTPATIENT_CLINIC_OR_DEPARTMENT_OTHER): Payer: Commercial Managed Care - PPO | Admitting: Certified Registered Nurse Anesthetist

## 2020-05-27 ENCOUNTER — Encounter (HOSPITAL_BASED_OUTPATIENT_CLINIC_OR_DEPARTMENT_OTHER)
Admission: RE | Disposition: A | Discharge status: Home / Self Care | Payer: Self-pay | Source: Home / Self Care | Attending: Urology

## 2020-05-27 ENCOUNTER — Encounter (HOSPITAL_BASED_OUTPATIENT_CLINIC_OR_DEPARTMENT_OTHER): Payer: Self-pay | Admitting: Urology

## 2020-05-27 DIAGNOSIS — Z7982 Long term (current) use of aspirin: Secondary | ICD-10-CM | POA: Insufficient documentation

## 2020-05-27 DIAGNOSIS — N202 Calculus of kidney with calculus of ureter: Secondary | ICD-10-CM | POA: Diagnosis present

## 2020-05-27 DIAGNOSIS — R3912 Poor urinary stream: Secondary | ICD-10-CM | POA: Insufficient documentation

## 2020-05-27 DIAGNOSIS — Z882 Allergy status to sulfonamides status: Secondary | ICD-10-CM | POA: Insufficient documentation

## 2020-05-27 DIAGNOSIS — N138 Other obstructive and reflux uropathy: Secondary | ICD-10-CM | POA: Insufficient documentation

## 2020-05-27 DIAGNOSIS — Z79899 Other long term (current) drug therapy: Secondary | ICD-10-CM | POA: Insufficient documentation

## 2020-05-27 DIAGNOSIS — R3911 Hesitancy of micturition: Secondary | ICD-10-CM | POA: Insufficient documentation

## 2020-05-27 DIAGNOSIS — Z79890 Hormone replacement therapy (postmenopausal): Secondary | ICD-10-CM | POA: Diagnosis not present

## 2020-05-27 DIAGNOSIS — N200 Calculus of kidney: Secondary | ICD-10-CM

## 2020-05-27 DIAGNOSIS — Z87442 Personal history of urinary calculi: Secondary | ICD-10-CM | POA: Insufficient documentation

## 2020-05-27 DIAGNOSIS — N401 Enlarged prostate with lower urinary tract symptoms: Secondary | ICD-10-CM | POA: Insufficient documentation

## 2020-05-27 DIAGNOSIS — Z880 Allergy status to penicillin: Secondary | ICD-10-CM | POA: Diagnosis not present

## 2020-05-27 DIAGNOSIS — R39198 Other difficulties with micturition: Secondary | ICD-10-CM | POA: Insufficient documentation

## 2020-05-27 HISTORY — PX: CYSTOSCOPY/URETEROSCOPY/HOLMIUM LASER/STENT PLACEMENT: SHX6546

## 2020-05-27 LAB — POCT I-STAT, CHEM 8
BUN: 24 mg/dL — ABNORMAL HIGH (ref 8–23)
Calcium, Ion: 1.3 mmol/L (ref 1.15–1.40)
Chloride: 103 mmol/L (ref 98–111)
Creatinine, Ser: 1.3 mg/dL — ABNORMAL HIGH (ref 0.61–1.24)
Glucose, Bld: 84 mg/dL (ref 70–99)
HCT: 45 % (ref 39.0–52.0)
Hemoglobin: 15.3 g/dL (ref 13.0–17.0)
Potassium: 3.8 mmol/L (ref 3.5–5.1)
Sodium: 141 mmol/L (ref 135–145)
TCO2: 25 mmol/L (ref 22–32)

## 2020-05-27 SURGERY — CYSTOSCOPY/URETEROSCOPY/HOLMIUM LASER/STENT PLACEMENT
Anesthesia: General | Laterality: Left

## 2020-05-27 MED ORDER — FENTANYL CITRATE (PF) 100 MCG/2ML IJ SOLN
INTRAMUSCULAR | Status: DC | PRN
  Administered 2020-05-27 (×2): 50 ug via INTRAVENOUS

## 2020-05-27 MED ORDER — MIDAZOLAM HCL 2 MG/2ML IJ SOLN
INTRAMUSCULAR | Status: AC
  Filled 2020-05-27: qty 2

## 2020-05-27 MED ORDER — SODIUM CHLORIDE 0.9 % IV SOLN: INTRAVENOUS | Status: DC

## 2020-05-27 MED ORDER — OXYCODONE HCL 5 MG PO TABS
10.0000 mg | ORAL_TABLET | Freq: Once | ORAL | Status: AC
Start: 2020-05-27 — End: 2020-05-27
  Administered 2020-05-27: 10 mg via ORAL

## 2020-05-27 MED ORDER — LIDOCAINE 2% (20 MG/ML) 5 ML SYRINGE
INTRAMUSCULAR | Status: DC | PRN
  Administered 2020-05-27: 100 mg via INTRAVENOUS

## 2020-05-27 MED ORDER — ONDANSETRON HCL 4 MG/2ML IJ SOLN
INTRAMUSCULAR | Status: DC | PRN
  Administered 2020-05-27: 4 mg via INTRAVENOUS

## 2020-05-27 MED ORDER — EPHEDRINE SULFATE-NACL 50-0.9 MG/10ML-% IV SOSY
PREFILLED_SYRINGE | INTRAVENOUS | Status: DC | PRN
  Administered 2020-05-27: 10 mg via INTRAVENOUS

## 2020-05-27 MED ORDER — ONDANSETRON HCL 4 MG/2ML IJ SOLN
INTRAMUSCULAR | Status: AC
  Filled 2020-05-27: qty 2

## 2020-05-27 MED ORDER — LACTATED RINGERS IV SOLN: INTRAVENOUS | Status: DC

## 2020-05-27 MED ORDER — IOHEXOL 300 MG/ML  SOLN
INTRAMUSCULAR | Status: DC | PRN
  Administered 2020-05-27: 6 mL via URETHRAL

## 2020-05-27 MED ORDER — MORPHINE SULFATE 15 MG PO TABS: 7.5000 mg | ORAL_TABLET | ORAL | 0 refills | Status: DC | PRN

## 2020-05-27 MED ORDER — EPHEDRINE 5 MG/ML INJ
INTRAVENOUS | Status: AC
  Filled 2020-05-27: qty 10

## 2020-05-27 MED ORDER — SODIUM CHLORIDE 0.9 % IR SOLN
Status: DC | PRN
  Administered 2020-05-27: 6000 mL via INTRAVESICAL

## 2020-05-27 MED ORDER — PHENYLEPHRINE 40 MCG/ML (10ML) SYRINGE FOR IV PUSH (FOR BLOOD PRESSURE SUPPORT)
PREFILLED_SYRINGE | INTRAVENOUS | Status: AC
  Filled 2020-05-27: qty 10

## 2020-05-27 MED ORDER — PROPOFOL 10 MG/ML IV BOLUS
INTRAVENOUS | Status: AC
  Filled 2020-05-27: qty 20

## 2020-05-27 MED ORDER — OXYBUTYNIN CHLORIDE ER 10 MG PO TB24
10.0000 mg | ORAL_TABLET | Freq: Every day | ORAL | Status: DC
  Administered 2020-05-27: 10 mg via ORAL

## 2020-05-27 MED ORDER — GENTAMICIN SULFATE 40 MG/ML IJ SOLN: 5.0000 mg/kg | Freq: Once | INTRAVENOUS | Status: DC

## 2020-05-27 MED ORDER — MIDAZOLAM HCL 2 MG/2ML IJ SOLN
INTRAMUSCULAR | Status: DC | PRN
  Administered 2020-05-27: 2 mg via INTRAVENOUS

## 2020-05-27 MED ORDER — DEXAMETHASONE SODIUM PHOSPHATE 10 MG/ML IJ SOLN
INTRAMUSCULAR | Status: DC | PRN
  Administered 2020-05-27: 10 mg via INTRAVENOUS

## 2020-05-27 MED ORDER — DEXAMETHASONE SODIUM PHOSPHATE 10 MG/ML IJ SOLN
INTRAMUSCULAR | Status: AC
  Filled 2020-05-27: qty 1

## 2020-05-27 MED ORDER — OXYBUTYNIN CHLORIDE ER 10 MG PO TB24: 10.0000 mg | ORAL_TABLET | Freq: Every day | ORAL | 0 refills | Status: AC

## 2020-05-27 MED ORDER — FENTANYL CITRATE (PF) 100 MCG/2ML IJ SOLN
INTRAMUSCULAR | Status: AC
  Filled 2020-05-27: qty 2

## 2020-05-27 MED ORDER — OXYCODONE HCL 5 MG PO TABS
ORAL_TABLET | ORAL | Status: AC
  Filled 2020-05-27: qty 2

## 2020-05-27 MED ORDER — PHENYLEPHRINE 40 MCG/ML (10ML) SYRINGE FOR IV PUSH (FOR BLOOD PRESSURE SUPPORT)
PREFILLED_SYRINGE | INTRAVENOUS | Status: DC | PRN
  Administered 2020-05-27 (×2): 120 ug via INTRAVENOUS

## 2020-05-27 MED ORDER — FENTANYL CITRATE (PF) 100 MCG/2ML IJ SOLN
25.0000 ug | INTRAMUSCULAR | Status: DC | PRN
  Administered 2020-05-27 (×2): 50 ug via INTRAVENOUS

## 2020-05-27 MED ORDER — GENTAMICIN SULFATE 40 MG/ML IJ SOLN
460.0000 mg | Freq: Once | INTRAVENOUS | Status: AC
  Administered 2020-05-27: 460 mg via INTRAVENOUS
  Filled 2020-05-27: qty 11.5

## 2020-05-27 MED ORDER — OXYBUTYNIN CHLORIDE ER 10 MG PO TB24
ORAL_TABLET | ORAL | Status: AC
  Filled 2020-05-27: qty 1

## 2020-05-27 MED ORDER — PROPOFOL 10 MG/ML IV BOLUS
INTRAVENOUS | Status: DC | PRN
  Administered 2020-05-27: 200 mg via INTRAVENOUS

## 2020-05-27 SURGICAL SUPPLY — 28 items
APL SKNCLS STERI-STRIP NONHPOA (GAUZE/BANDAGES/DRESSINGS) ×1
BAG DRAIN URO-CYSTO SKYTR STRL (DRAIN) ×2 IMPLANT
BAG DRN UROCATH (DRAIN) ×1
BASKET ZERO TIP NITINOL 2.4FR (BASKET) ×1 IMPLANT
BENZOIN TINCTURE PRP APPL 2/3 (GAUZE/BANDAGES/DRESSINGS) ×1 IMPLANT
BSKT STON RTRVL ZERO TP 2.4FR (BASKET) ×1
CATH SET URETHRAL DILATOR (CATHETERS) IMPLANT
CATH URET 5FR 28IN OPEN ENDED (CATHETERS) ×2 IMPLANT
CLOTH BEACON ORANGE TIMEOUT ST (SAFETY) ×2 IMPLANT
DRSG TEGADERM 2-3/8X2-3/4 SM (GAUZE/BANDAGES/DRESSINGS) ×1 IMPLANT
FIBER LASER FLEXIVA 365 (UROLOGICAL SUPPLIES) IMPLANT
GLOVE SURG ENC MOIS LTX SZ7 (GLOVE) ×2 IMPLANT
GOWN STRL REUS W/TWL LRG LVL3 (GOWN DISPOSABLE) ×2 IMPLANT
GUIDEWIRE STR DUAL SENSOR (WIRE) ×2 IMPLANT
GUIDEWIRE ZIPWRE .038 STRAIGHT (WIRE) ×1 IMPLANT
IV NS 1000ML (IV SOLUTION) ×2
IV NS 1000ML BAXH (IV SOLUTION) ×1 IMPLANT
IV NS IRRIG 3000ML ARTHROMATIC (IV SOLUTION) ×4 IMPLANT
KIT TURNOVER CYSTO (KITS) ×2 IMPLANT
MANIFOLD NEPTUNE II (INSTRUMENTS) ×2 IMPLANT
NS IRRIG 500ML POUR BTL (IV SOLUTION) ×2 IMPLANT
PACK CYSTO (CUSTOM PROCEDURE TRAY) ×2 IMPLANT
SYR 10ML LL (SYRINGE) ×2 IMPLANT
TRACTIP FLEXIVA PULS ID 200XHI (Laser) IMPLANT
TRACTIP FLEXIVA PULSE ID 200 (Laser)
TUBE CONNECTING 12X1/4 (SUCTIONS) ×2 IMPLANT
TUBE FEEDING 8FR 16IN STR KANG (MISCELLANEOUS) IMPLANT
TUBING UROLOGY SET (TUBING) ×1 IMPLANT

## 2020-05-27 NOTE — H&P (Signed)
Office Visit Report     04/11/2020   --------------------------------------------------------------------------------   Daaron Dimarco  MRN: [id number]  DOB: 02/04/1961, 60 year old Male  SSN:    PRIMARY CARE:  [name] Swaziland, MD  REFERRING:    PROVIDER:  Marcine Matar, M.D.  TREATING:  Jettie Pagan, M.D.  LOCATION:  [facility] [phone]     --------------------------------------------------------------------------------   CC/HPI: Veatrice Bourbon i a 60 year old male seen in follow-up today with left ureteral stone and left renal stone.   He presented to the ED on 03/11/2020 with acute left flank pain. CT A/P demonstrated a punctate 2-3 mm calculus just proximal to the left UVJ. There is also superimposed left nephrolithiasis which is approximately 5 mm in the left lower pole. He also has benign renal cysts and left side parapelvic cyst. He thought he was able to pass a stone and actually brought this in for the last visit which revealed calcium oxalate.   He developed acute worsening of left flank pain this morning. He is actually writhing in pain on examination today. He has had some nausea and emesis. He denies fevers or chills.   He does have a history of urolithiasis and states he has had at least 4 times of medical expulsive therapy. He denies any procedures to treat his stones.   He does state he has chronic lower urinary tract symptoms including sensation incomplete bladder emptying, frequency, intermittency, urgency, weak flow stream, straining to void and for time nocturia. IPSS score is 17, Q OL 3. He denies taking medical therapy for this prior.     ALLERGIES: Penicillin Sulfa Drugs    MEDICATIONS: Aspirin 81 mg tablet,chewable  Hydrochlorothiazide 25 mg tablet  Tamsulosin Hcl 0.4 mg capsule 1 capsule PO Q HS  Eye Vitamin  Fish Oil  Irbesartan  Levothyroxine 100 mcg capsule  Multivitamin  Nexletol 180 mg tablet  Ocuvite Adult 50 Plus  Turmeric   Ubiquinol 100 mg capsule  Vitamin C  Vitamin D     GU PSH: None     PSH Notes: hernia mesh, knee scope   NON-GU PSH: None   GU PMH: Renal calculus - 03/14/2020 Renal cyst - 03/14/2020 Ureteral calculus - 03/14/2020 Urinary Hesitancy - 03/14/2020 Weak Urinary Stream - 03/14/2020    NON-GU PMH: Cardiac murmur, unspecified GERD Hypercholesterolemia Hypertension Hypothyroidism    FAMILY HISTORY: pancreatic cancer - Father Prostate Cancer - Runs in Family   SOCIAL HISTORY: Marital Status: Unknown Preferred Language: English; Ethnicity: Not Hispanic Or Latino; Race: White Current Smoking Status: Patient has never smoked.   Tobacco Use Assessment Completed: Used Tobacco in last 30 days? Drinks 2 drinks per day.  Does not drink caffeine.    REVIEW OF SYSTEMS:    GU Review Male:   Patient denies frequent urination, hard to postpone urination, burning/ pain with urination, get up at night to urinate, leakage of urine, stream starts and stops, trouble starting your stream, have to strain to urinate , erection problems, and penile pain.  Gastrointestinal (Upper):   Patient denies nausea, vomiting, and indigestion/ heartburn.  Gastrointestinal (Lower):   Patient denies diarrhea and constipation.  Constitutional:   Patient denies fever, night sweats, weight loss, and fatigue.  Skin:   Patient denies skin rash/ lesion and itching.  Eyes:   Patient denies blurred vision and double vision.  Ears/ Nose/ Throat:   Patient denies sore throat and sinus problems.  Hematologic/Lymphatic:   Patient denies swollen glands and easy bruising.  Cardiovascular:  Patient denies leg swelling and chest pains.  Respiratory:   Patient denies cough and shortness of breath.  Endocrine:   Patient denies excessive thirst.  Musculoskeletal:   Patient denies back pain and joint pain.  Neurological:   Patient denies headaches and dizziness.  Psychologic:   Patient denies depression and anxiety.   VITAL  SIGNS:      04/11/2020 01:58 PM  Weight 200 lb / 90.72 kg  BP 135/95 mmHg  Pulse 73 /min  Temperature 97.3 F / 36.2 C   MULTI-SYSTEM PHYSICAL EXAMINATION:    Constitutional: Well-nourished. No physical deformities. Normally developed. Good grooming.  Respiratory: No labored breathing, no use of accessory muscles.   Cardiovascular: Normal temperature, normal extremity pulses, no swelling, no varicosities.  Gastrointestinal: No mass, no tenderness, no rigidity, non obese abdomen. Mild L CVA tenderness.     Complexity of Data:  Source Of History:  Patient, Medical Record Summary  Records Review:   AUA Symptom Score, Previous Doctor Records, Previous Patient Records, IIEF Score  Urine Test Review:   Urinalysis  X-Ray Review: C.T. Abdomen/Pelvis: Reviewed Films. Reviewed Report.     PROCEDURES:         C.T. Urogram - [id number]      Patient confirmed No Neulasta OnPro Device.        PVR Ultrasound - [id number]  Scanned Volume: 155 cc         Urinalysis w/Scope Dipstick Dipstick Cont'd Micro  Color: Yellow Bilirubin: Neg mg/dL WBC/hpf: 0 - 5/hpf  Appearance: Slightly Cloudy Ketones: Neg mg/dL RBC/hpf: 3 - 02/OVZ  Specific Gravity: 1.025 Blood: Trace ery/uL Bacteria: NS (Not Seen)  pH: <=5.0 Protein: Neg mg/dL Cystals: NS (Not Seen)  Glucose: Neg mg/dL Urobilinogen: 0.2 mg/dL Casts: NS (Not Seen)    Nitrites: Neg Trichomonas: Not Present    Leukocyte Esterase: Neg leu/uL Mucous: Present      Epithelial Cells: NS (Not Seen)      Yeast: NS (Not Seen)      Sperm: Not Present    ASSESSMENT:      ICD-10 Details  1 GU:   Renal calculus - N20.0   2   Ureteral calculus - N20.1   3   Flank Pain - R10.84    PLAN:           Orders Labs Urinalysis, CULTURE, URINE  X-Rays: C.T. Stone Protocol Without Contrast. No Oral Contrast  X-Ray Notes: History:  Hematuria: Yes/No  Patient to see MD after exam: Yes/No  Previous exam: CT / IVP/ US/ KUB/ None  When:  Where:  Diabetic: Yes/  No  BUN/ Creatine:  Date of last BUN Creatinine:  Weight in pounds:  Allergy- Contrasts/ Shellfish: Yes/ No  Conflicting diabetic meds: Yes/ No  Oral contrast and instructions given to patient:   Prior Authorization #: NPCR ref # Annett Gula [id number] 216 pm            Schedule         Document Letter(s):  Created for Patient: Clinical Summary         Notes:   1. Left ureteral stone: CT 04/11/2020 with 1-2 mm calcific density in the distal left ureter with similar mild left collecting system distention. Given the severe nature of his pain, I recommended cystoscopy, left ureteral stent placement. He agrees. I also submitted surgery low to treat his ureteral and renal stone electively.   2. Left renal stones: Identified a left lower pole stone measuring 5 mm discussed  options for management including surveillance, ESWL, URS. He elects to proceed with URS to clear his stone burden. We will send urine for culture.   We discussed the options for management of kidney stones, including observation, ESWL, ureteroscopy with laser lithotripsy, and PCNL. The risks and benefits of each option were discussed.  For observation I described the risks which include but are not limited to silent renal damage, life-threatening infection, need for emergent surgery, failure to pass stone, and pain.   ESWL: risks and benefits of ESWL were outlined including infection, bleeding, pain, steinstrasse, kidney injury, need for ancillary treatments, and global anesthesia risks including but not limited to CVA, MI, DVT, PE, pneumonia, and death.   Ureteroscopy: risks and benefits of ureteroscopy were outlined, including infection, bleeding, pain, temporary ureteral stent and associated stent bother, ureteral injury, ureteral stricture, need for ancillary treatments, and global anesthesia risks including but not limited to CVA, MI, DVT, PE, pneumonia, and death.   PCNL: risks and benefits of PCNL were outlined  including infection, bleeding, blood transfusion, pain, pneumothorax, bowel injury, persistent urine leak, positioning injury, inability to clear stone burden, renal laceration, arterial venous fistula or malformation, need for ancillary treatments, and global anesthesia risks including but not limited to CVA, MI, DVT, PE, pneumonia, and death.   3. Medical evaluation for urolithiasis: I reviewed his 24-hour urine with him today. There is evidence of low urine volume, borderline hyperoxaluria, low pH. I did offer potassium citrate. He offers to treat first with dietary means.   We discussed dietary methods for stone prevention including the following: increased water intake to 2-3 liters per day, add lemon or lemon concentrate to water to increase citrate which is beneficial for stone prevention, limiting dietary sodium to less than 2000 mg per day, limiting animal protein to less than 2 servings (16 ounces/day), and limiting foods high in oxalate content (spinach, beans, chocolate, etc.).   4. BPH with LUTS: Significant lower urinary tract symptoms including weak flow stream, intermittency, hesitancy. IPSS score is 17. Will obtain PSA after acute stone issues resolved. Continue tamsulosin.   5. B/l renal cysts: Seen on CT A/P 12/6. Discussed that these are benign. No further followup needed.   CC: [name] Swaziland, MD        Next Appointment:      Next Appointment: 05/14/2020 09:45 AM    Appointment Type: Postoperative Appointment    Location: [facility] [phone]    Provider: Jettie Pagan, M.D.    Reason for Visit: PO 1 MONTH      Signed by Jettie Pagan, M.D. on 04/11/20 at 9:52 PM (EST)  Urology Preoperative H&P   Chief Complaint: Left ureteral and renal stones  History of Present Illness: [name] is a 60 y.o. male with left ureteral and renal stones here for cysto, L URS/LL, L RPG, L stent exchange. He had Covid recently and case was delayed. He reports  doing well. No fevers, chills. No dysuria. Preop UCx neg.    Past Medical History:  Diagnosis Date  . ADHD   . Arthritis   . Chronic kidney disease (CKD), stage III (moderate) (HCC)    followed by pcp  . GERD (gastroesophageal reflux disease)    per pt adjusted diet  . Heart murmur    per pt was told by pcp approx. 2020 that a slight murmur heard, did not order any work-up  . History of 2019 novel coronavirus disease (COVID-19) 05/03/2020   positive resolve  in epic,  per pt had fever, throat ulcers, cough.  fever resolved in 2 days, throat ulcers resolved in 1 week,  and cough resolved in 3 wks  . History of kidney stones   . Hypertension   . Hypothyroidism    endocrinologist--- dr   . Left ureteral calculus   . Migraine headache without aura   . Renal cyst   . Urgency of urination   . Wears contact lenses   . Wears hearing aid in both ears     Past Surgical History:  Procedure Laterality Date  . COLONOSCOPY  last one 02/ 2019  . CYSTOSCOPY W/ URETERAL STENT PLACEMENT Left 04/11/2020   Procedure: CYSTOSCOPY WITH RETROGRADE PYELOGRAM/URETERAL STENT PLACEMENT;  Surgeon: Jannifer Hick, MD;  Location: [facility];  Service: Urology;  Laterality: Left;  . INGUINAL HERNIA REPAIR Bilateral left 02-04-2010 @ [facility]/  right  . KNEE ARTHROSCOPY Left 2002 approx.  Marland Kitchen SHOULDER ARTHROSCOPY Left 03/22/2020   Procedure: LEFT SHOULDER ARTHROSCOPY,ACROMIOPLASTY, DEBRIDEMENT;  Surgeon: Marcene Corning, MD;  Location: [facility];  Service: Orthopedics;  Laterality: Left;  . TONSILLECTOMY  2008 approx.  . WISDOM TOOTH EXTRACTION      Allergies:  Allergies  Allergen Reactions  . Benazepril Hcl Anaphylaxis and Swelling  . Other Diarrhea and Swelling    oysters  . Sulfa Antibiotics Diarrhea and Other (See Comments)  . Hydrocodone Nausea And Vomiting  . Penicillins Hives    Family History  Problem Relation Age of Onset  . Mental illness Mother   . Cancer Paternal Aunt        lung  . Cancer  Paternal Grandmother        lung  . Diabetes Neg Hx     Social History:  reports that he has never smoked. He has never used smokeless tobacco. He reports current alcohol use. He reports that he does not use drugs.  ROS: A complete review of systems was performed.  All systems are negative except for pertinent findings as noted.  Physical Exam:  Vital signs in last 24 hours: Temp:  [97.8 F (36.6 C)] 97.8 F (36.6 C) (02/21 1219) Pulse Rate:  [58] 58 (02/21 1219) Resp:  [16] 16 (02/21 1219) BP: (142)/(92) 142/92 (02/21 1219) SpO2:  [99 %] 99 % (02/21 1219) Weight:  [92.9 kg] 92.9 kg (02/21 1219) Constitutional:  Alert and oriented, No acute distress Cardiovascular: Regular rate and rhythm Respiratory: Normal respiratory effort, Lungs clear bilaterally GI: Abdomen is soft, nontender, nondistended, no abdominal masses GU: No CVA tenderness Lymphatic: No lymphadenopathy Neurologic: Grossly intact, no focal deficits Psychiatric: Normal mood and affect  Laboratory Data:  Recent Labs    05/27/20 1224  HGB 15.3  HCT 45.0    Recent Labs    05/27/20 1224  NA 141  K 3.8  CL 103  GLUCOSE 84  BUN 24*  CREATININE 1.30*     Results for orders placed or performed during the hospital encounter of 05/27/20 (from the past 24 hour(s))  I-STAT, chem 8     Status: Abnormal   Collection Time: 05/27/20 12:24 PM  Result Value Ref Range   Sodium 141 135 - 145 mmol/L   Potassium 3.8 3.5 - 5.1 mmol/L   Chloride 103 98 - 111 mmol/L   BUN 24 (H) 8 - 23 mg/dL   Creatinine, Ser 4.03 (H) 0.61 - 1.24 mg/dL   Glucose, Bld 84 70 - 99 mg/dL   Calcium, Ion 4.74 2.59 - 1.40 mmol/L  TCO2 25 22 - 32 mmol/L   Hemoglobin 15.3 13.0 - 17.0 g/dL   HCT 16.145.0 09.639.0 - 04.552.0 %   No results found for this or any previous visit (from the past 240 hour(s)).  Renal Function: Recent Labs    05/27/20 1224  CREATININE 1.30*   Estimated Creatinine Clearance: 69.5 mL/min (A) (by C-G formula based on SCr  of 1.3 mg/dL (H)).  Radiologic Imaging: No results found.  I independently reviewed the above imaging studies.  Assessment and Plan Kathreen CornfieldWalter Bernard Waldridge is a 60 y.o. male with left ureteral and renal stones here for cysto, L URS/LL, L RPG, L stent exchange. Preop UCx neg.   -The risks, benefits and alternatives of cystoscopy L URS/LL, L RPG, L JJ stent exchange was discussed with the patient.  Risks include, but are not limited to: bleeding, urinary tract infection, ureteral injury, ureteral stricture disease, chronic pain, urinary symptoms, bladder injury, stent migration, the need for nephrostomy tube placement, MI, CVA, DVT, PE and the inherent risks with general anesthesia.  The patient voices understanding and wishes to proceed.   Matt R. Dimitra Woodstock MD 05/27/2020, 12:51 PM  [facility] Pager: [phone]

## 2020-05-27 NOTE — Anesthesia Preprocedure Evaluation (Signed)
Anesthesia Evaluation  Patient identified by MRN, date of birth, ID band Patient awake    Reviewed: Allergy & Precautions, NPO status , Patient's Chart, lab work & pertinent test results  Airway Mallampati: II  TM Distance: >3 FB     Dental   Pulmonary    breath sounds clear to auscultation       Cardiovascular hypertension, + Valvular Problems/Murmurs  Rhythm:Regular Rate:Normal     Neuro/Psych  Headaches, PSYCHIATRIC DISORDERS    GI/Hepatic GERD  ,  Endo/Other  Hypothyroidism   Renal/GU Renal disease     Musculoskeletal  (+) Arthritis ,   Abdominal   Peds  Hematology   Anesthesia Other Findings   Reproductive/Obstetrics                             Anesthesia Physical Anesthesia Plan  ASA: III  Anesthesia Plan: General   Post-op Pain Management:    Induction: Intravenous  PONV Risk Score and Plan: 2 and Ondansetron and Dexamethasone  Airway Management Planned: LMA  Additional Equipment:   Intra-op Plan:   Post-operative Plan: Extubation in OR  Informed Consent: I have reviewed the patients History and Physical, chart, labs and discussed the procedure including the risks, benefits and alternatives for the proposed anesthesia with the patient or authorized representative who has indicated his/her understanding and acceptance.     Dental advisory given  Plan Discussed with: CRNA and Anesthesiologist  Anesthesia Plan Comments:         Anesthesia Quick Evaluation

## 2020-05-27 NOTE — Op Note (Addendum)
Operative Note  Preoperative diagnosis:  1.  Left ureteral and renal stones  Postoperative diagnosis: 1.  Left ureteral and renal stones  Procedure(s): 1.  Cystoscopy 2.  Left retrograde pyelogram 3.  Left ureteroscopy with basket extraction of stone 4.  Left ureteral stent exchange 5.  Fluoroscopy with intraoperative interpretation less than 1 hour   Surgeon: Jettie Pagan, MD  Assistants:  None  Anesthesia:  General  Complications:  None  EBL: Minimal  Specimens: 1.  Stones for stone analysis  Drains/Catheters: 1.  6 French by 26 cm left ureteral stent on a tether string  Intraoperative findings:   1. Cystoscopy demonstrated no urethral strictures with mildly obstructing prostate with no suspicious bladder lesions. 2. Stent was seen protruding from the left distal ureteral orifice with moderate amount of encrustation. 3. Left retrograde pyelogram demonstrated no filling defects.  There is no extravasation of contrast.  Final left retrograde pyelogram demonstrated appropriate position of the left ureteral stent with a curl within the renal pelvis and bladder respectively. 4. Left ureteroscopy demonstrated 1 mm fleck of calcific debris successfully removed in left distal ureter.  Left pyeloscopy demonstrated approximately 5 mm left lower pole stone which was also successfully basket extracted. 5. Successful left ureteral stent placement  Indication:  Johneric Mcfadden Swindle is a 60 y.o. male with a CT A/P on 03/11/2028 demonstrating a punctate 2-3 mm calculi just proximal to the left UVJ with additional left nephrolithiasis measuring 5 mm in left lower pole.  He underwent left stent placement on 04/11/2020.  He presents today to the operating room for definitive clearance of his left ureteral and renal stones.  Description of procedure: After informed consent was obtained from the patient, the patient was identified and taken to the operating room and placed in the supine position.   General anesthesia was administered as well as perioperative IV antibiotics.  At the beginning of the case, a time-out was performed to properly identify the patient, the surgery to be performed, and the surgical site.  Sequential compression devices were applied to the lower extremities at the beginning of the case for DVT prophylaxis.  The patient was then placed in the dorsal lithotomy supine position, prepped and draped in sterile fashion.  Preliminary scout fluoroscopy revealed that there was a 70mm calcification area at the left lower pole, which corresponds to the stone found on the preoperative CT scan. We then passed the 21-French rigid cystoscope through the urethra and into the bladder under vision without any difficulty , noting a normal urethra without strictures and a mildly obstructing prostate.  A systematic evaluation of the bladder revealed no evidence of any suspicious bladder lesions.  Ureteral orifices were in normal position.    The distal aspect of the ureteral stent was seen protruding from the left ureteral orifice with moderate amount of encrustation.  We then passed a 0.038 sensor wire up to level the renal pelvis alongside the pre-existing stent.  We then used the alligator-tooth forceps and grasped the distal end of the ureteral stent and brought it out the urethral meatus while watching the proximal coil straighten out nicely on fluoroscopy.  The ureteral stent was then removed, leaving the sensor wire up the left ureter.    A semi-rigid ureteroscope was passed alongside the wire up the distal ureter which appeared normal.  We did encounter approximately 1-2 mm calcific debris in the left distal ureter which was basket extracted and removed.  There were no other stones along the  course of the ureter.  A second 0.038 sensor wire was passed under direct vision and the semirigid scope was removed. 12/13.  Fr ureteral access sheath was carefully advanced up the ureter to the level of  the UPJ over this wire under fluoroscopic guidance. The flexible ureteroscope was advanced into the collecting system via the access sheath. The collecting system was inspected. The calculus was identified at the left lower pole.  There was an approximately 5 mm stone that was amenable to basket extraction given a previously stented and dilated ureter. A 2.2 Fr zero tip basket was used to remove the stone under visual guidance with no difficulty or trauma to the ureter. This was sent for chemical analysis. With the ureteroscope in the kidney, a gentle pyelogram was performed to delineate the calyceal system and we evaluated the calyces systematically. We encountered no further stones. The calyces were re-inspected and there were no significant stone fragment residual.   We then withdrew the ureteroscope back down the ureter along with the access sheath, noting no evidence of any stones along the course of the ureter.  There was no trauma to the ureter.  Prior to removing the ureteroscope, we did pass the Glidewire back up to the ureter to the renal pelvis.  Once the ureteroscope was removed, the Glidewire was backloaded through the rigid cystoscope, which was then advanced down the urethra and into the bladder. We then used the Glidewire under direct vision through the rigid cystoscope and under fluoroscopic guidance and passed up a 6-French, 26 cm double-pigtail ureteral stent up ureter, making sure that the proximal and distal ends coiled within the kidney and bladder respectively.  Note that we left a long tether string attached to the distal end of the ureteral stent and it exited the urethral meatus and was secured to the penile shaft.  With a tegaderm adhesive.  The cystoscope was then advanced back into the bladder under vision.  We were able to see the distal stent coiling nicely within the bladder.  The bladder was then emptied with irrigation solution.  The cystoscope was then removed.    The patient  tolerated the procedure well and there was no complication. Patient was awoken from anesthesia and taken to the recovery room in stable condition. I was present and scrubbed for the entirety of the case.  Plan:  Patient will be discharged home.  He will remove his stent on Thursday morning by pulling attached string.   Matt R. Danney Bungert MD [facility]  Pager: [phone]

## 2020-05-27 NOTE — Discharge Instructions (Signed)
[facility] [phone] Post Ureteroscopy With or Without Stent Instructions  Definitions:  Ureter: The duct that transports urine from the kidney to the bladder. Stent:   A plastic hollow tube that is placed into the ureter, from the kidney to the bladder to prevent the ureter from swelling shut.  GENERAL INSTRUCTIONS:  Despite the fact that no skin incisions were used, the area around the ureter and bladder is raw and irritated. The stent is a foreign body which will further irritate the bladder wall. This irritation is manifested by increased frequency of urination, both day and night, and by an increase in the urge to urinate. In some, the urge to urinate is present almost always. Sometimes the urge is strong enough that you may not be able to stop yourself from urinating. The only real cure is to remove the stent and then give time for the bladder wall to heal which can't be done until the danger of the ureter swelling shut has passed, which varies.  You may see some blood in your urine while the stent is in place and a few days afterwards. Do not be alarmed, even if the urine was clear for a while. Get off your feet and drink lots of fluids until clearing occurs. If you start to pass clots or don't improve, call us.  DIET: You may return to your normal diet immediately. Because of the raw surface of your bladder, alcohol, spicy foods, acid type foods and drinks with caffeine may cause irritation or frequency and should be used in moderation. To keep your urine flowing freely and to avoid constipation, drink plenty of fluids during the day ( 8-10 glasses ). Tip: Avoid cranberry juice because it is very acidic.  ACTIVITY: Your physical activity doesn't need to be restricted. However, if you are very active, you may see some blood in your urine. We suggest that you reduce your activity under these circumstances until the bleeding has stopped.  BOWELS: It is important to  keep your bowels regular during the postoperative period. Straining with bowel movements can cause bleeding. A bowel movement every other day is reasonable. Use a mild laxative if needed, such as Milk of Magnesia 2-3 tablespoons, or 2 Dulcolax tablets. Call if you continue to have problems. If you have been taking narcotics for pain, before, during or after your surgery, you may be constipated. Take a laxative if necessary.   MEDICATION: You should resume your pre-surgery medications unless told not to. In addition you will often be given an antibiotic to prevent infection. These should be taken as prescribed until the bottles are finished unless you are having an unusual reaction to one of the drugs.  PROBLEMS YOU SHOULD REPORT TO [place]:  Fevers over 100.5 Fahrenheit.  Heavy bleeding, or clots ( See above notes about blood in urine ).  Inability to urinate.  Drug reactions ( hives, rash, nausea, vomiting, diarrhea ).  Severe burning or pain with urination that is not improving.  FOLLOW-UP: You will need a follow-up appointment to monitor your progress. Call for this appointment at the number listed above. Usually the first appointment will be about three to fourteen days after your surgery.  You have a left ureteral stent in place attached to a string. Remove this stent on Thursday AM by pulling on attached string.   Post Anesthesia Home Care Instructions  Activity: Get plenty of rest for the remainder of the day. A responsible adult should stay with you for 24  hours following the procedure.  For the next 24 hours, DO NOT: -Drive a car -Advertising copywriter -Drink alcoholic beverages -Take any medication unless instructed by your physician -Make any legal decisions or sign important papers.  Meals: Start with liquid foods such as gelatin or soup. Progress to regular foods as tolerated. Avoid greasy, spicy, heavy foods. If nausea and/or vomiting occur, drink only clear liquids until the  nausea and/or vomiting subsides. Call your physician if vomiting continues.  Special Instructions/Symptoms: Your throat may feel dry or sore from the anesthesia or the breathing tube placed in your throat during surgery. If this causes discomfort, gargle with warm salt water. The discomfort should disappear within 24 hours.  If you had a scopolamine patch placed behind your ear for the management of post- operative nausea and/or vomiting:  1. The medication in the patch is effective for 72 hours, after which it should be removed.  Wrap patch in a tissue and discard in the trash. Wash hands thoroughly with soap and water. 2. You may remove the patch earlier than 72 hours if you experience unpleasant side effects which may include dry mouth, dizziness or visual disturbances. 3. Avoid touching the patch. Wash your hands with soap and water after contact with the patch.

## 2020-05-27 NOTE — Transfer of Care (Signed)
Immediate Anesthesia Transfer of Care Note  Patient: [name]  Procedure(s) Performed: CYSTOSCOPY/RETROGRADE/URETEROSCOPY/HOLMIUM LASER/STENT PLACEMENT (Left )  Patient Location: PACU  Anesthesia Type:General  Level of Consciousness: awake, alert , oriented and patient cooperative  Airway & Oxygen Therapy: Patient Spontanous Breathing  Post-op Assessment: Report given to RN and Post -op Vital signs reviewed and stable  Post vital signs: Reviewed and stable  Last Vitals:  Vitals Value Taken Time  BP 154/101 05/27/20 1458  Temp    Pulse 76 05/27/20 1459  Resp 15 05/27/20 1459  SpO2 97 % 05/27/20 1459  Vitals shown include unvalidated device data.  Last Pain:  Vitals:   05/27/20 1219  TempSrc: Oral  PainSc: 0-No pain      Patients Stated Pain Goal: 3 (05/27/20 1219)  Complications: No complications documented.

## 2020-05-27 NOTE — Anesthesia Procedure Notes (Signed)
Procedure Name: LMA Insertion Date/Time: 05/27/2020 1:56 PM Performed by: Norva Pavlov, CRNA Pre-anesthesia Checklist: Patient identified, Emergency Drugs available, Suction available and Patient being monitored Patient Re-evaluated:Patient Re-evaluated prior to induction Oxygen Delivery Method: Circle system utilized Preoxygenation: Pre-oxygenation with 100% oxygen Induction Type: IV induction Ventilation: Mask ventilation without difficulty LMA: LMA inserted LMA Size: 4.0 Number of attempts: 1 Airway Equipment and Method: Bite block Placement Confirmation: positive ETCO2 Tube secured with: Tape Dental Injury: Teeth and Oropharynx as per pre-operative assessment

## 2020-05-27 NOTE — Anesthesia Postprocedure Evaluation (Signed)
Anesthesia Post Note  Patient: [name]  Procedure(s) Performed: CYSTOSCOPY/RETROGRADE/URETEROSCOPY/HOLMIUM LASER/STENT PLACEMENT (Left )     Patient location during evaluation: PACU Anesthesia Type: General Level of consciousness: awake Pain management: pain level controlled Vital Signs Assessment: post-procedure vital signs reviewed and stable Respiratory status: spontaneous breathing Cardiovascular status: stable Postop Assessment: no apparent nausea or vomiting Anesthetic complications: no   No complications documented.  Last Vitals:  Vitals:   05/27/20 1219 05/27/20 1458  BP: (!) 142/92 (!) 154/101  Pulse: (!) 58 72  Resp: 16 18  Temp: 36.6 C (!) 36.3 C  SpO2: 99% 95%    Last Pain:  Vitals:   05/27/20 1458  TempSrc:   PainSc: 10-Worst pain ever                 Richanda Darin

## 2020-05-28 ENCOUNTER — Encounter (HOSPITAL_BASED_OUTPATIENT_CLINIC_OR_DEPARTMENT_OTHER): Payer: Self-pay | Admitting: Urology

## 2020-06-19 ENCOUNTER — Other Ambulatory Visit: Payer: Self-pay | Admitting: Family Medicine

## 2020-06-19 DIAGNOSIS — I10 Essential (primary) hypertension: Secondary | ICD-10-CM

## 2020-09-22 ENCOUNTER — Other Ambulatory Visit: Payer: Self-pay | Admitting: Family Medicine

## 2021-06-04 ENCOUNTER — Other Ambulatory Visit: Payer: Self-pay | Admitting: Family Medicine

## 2021-06-04 DIAGNOSIS — I10 Essential (primary) hypertension: Secondary | ICD-10-CM

## 2021-06-09 IMAGING — MR MR HEAD W/O CM
11 series · 48 of 48 positions shown · non-contrast
Comparison: Report from brain MRI 12/10/2005 (images unavailable).

CLINICAL DATA: Dizziness. Additional history provided: Dizziness
for 4 months and feeling like passing out. Occipital headache.
Headache, acute, normal neuro exam, vertigo, central.

EXAM:
MRI HEAD WITHOUT CONTRAST
TECHNIQUE: Multiplanar, multiecho pulse sequences of the brain and surrounding
structures were obtained without intravenous contrast.

[Series 2: T1 · sagittal · 5.0mm · 0.45mm/px · 2 of 21 slices shown]
[im 1/21]
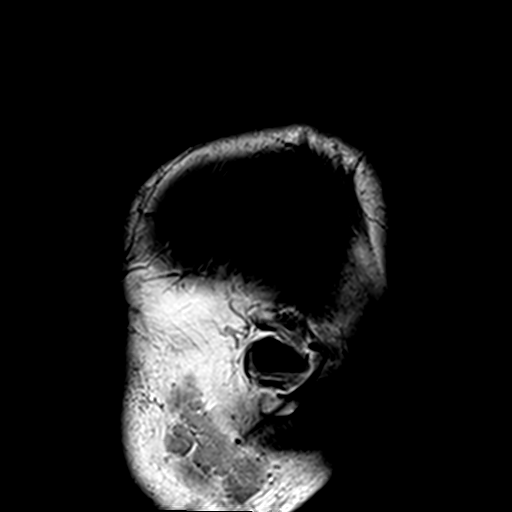
[im 21/21]
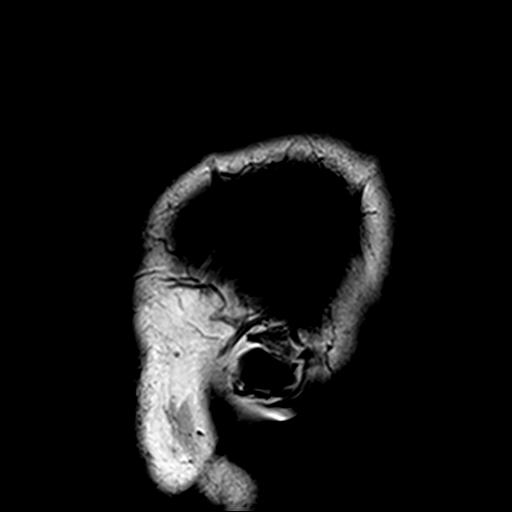

[Series 3: DWI · axial · 3.0mm · 1.88mm/px · z∈[-91,+59]mm · 9 of 104 slices shown (1 of 4)]
[im 1/104]
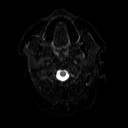
[im 13/104]
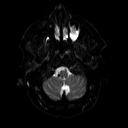
[im 26/104]
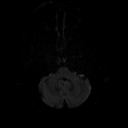
[im 39/104]
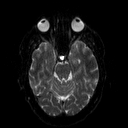
[im 52/104]
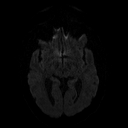
[im 65/104]
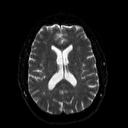
[im 78/104]
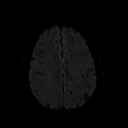
[im 91/104]
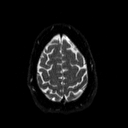
[im 104/104]
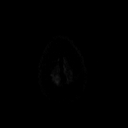

[Series 4: DWI · axial · 3.0mm · 1.88mm/px · z∈[-91,+59]mm · 4 of 48 slices shown (2 of 4)]
[im 1/48]
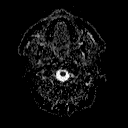
[im 16/48]
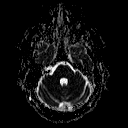
[im 32/48]
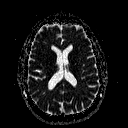
[im 48/48]
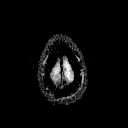

[Series 5: DWI · coronal · 5.0mm · 1.80mm/px · 6 of 70 slices shown (3 of 4)]
[im 1/70]
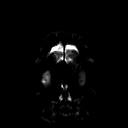
[im 14/70]
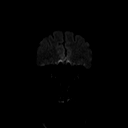
[im 28/70]
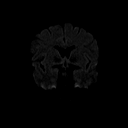
[im 42/70]
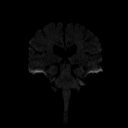
[im 56/70]
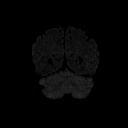
[im 70/70]
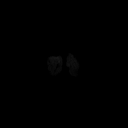

[Series 6: DWI · coronal · 5.0mm · 1.80mm/px · 3 of 35 slices shown (4 of 4)]
[im 1/35]
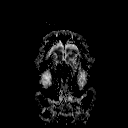
[im 18/35]
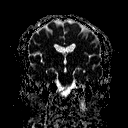
[im 35/35]
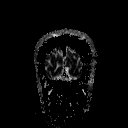

[Series 7: T2 · axial · 5.0mm · 0.51mm/px · z∈[-86,+58]mm · 2 of 22 slices shown (1 of 2)]
[im 1/22]
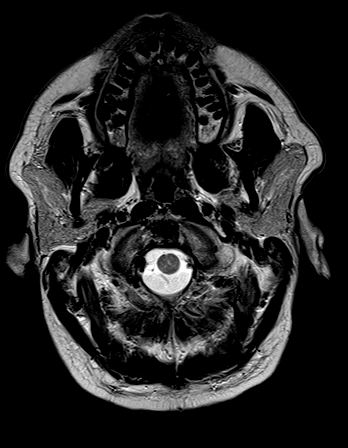
[im 22/22]
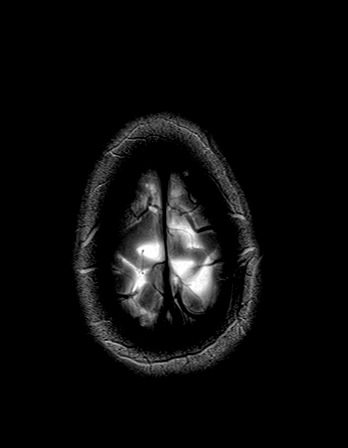

[Series 8: FLAIR · axial · 3.0mm · 0.45mm/px · z∈[-86,+56]mm · 3 of 32 slices shown (1 of 2)]
[im 1/32]
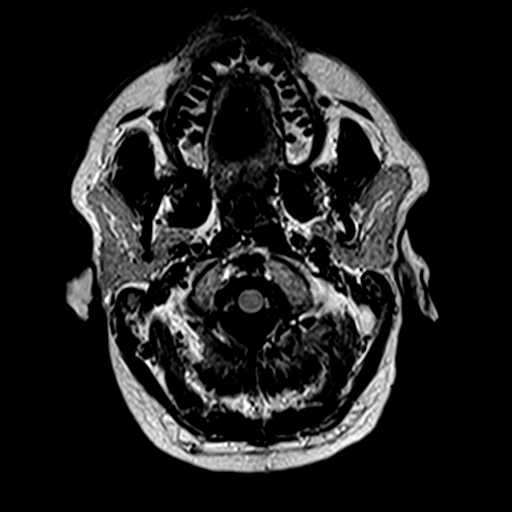
[im 16/32]
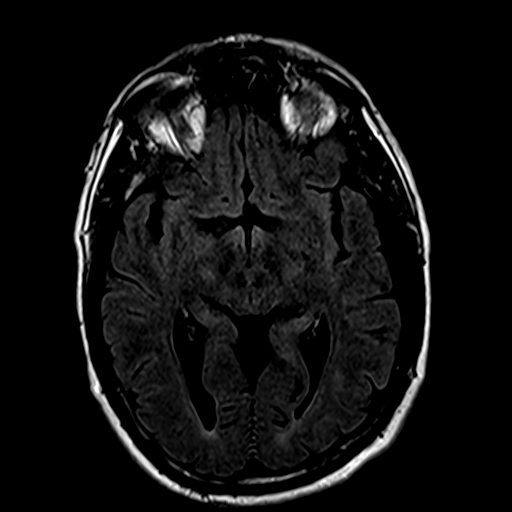
[im 32/32]
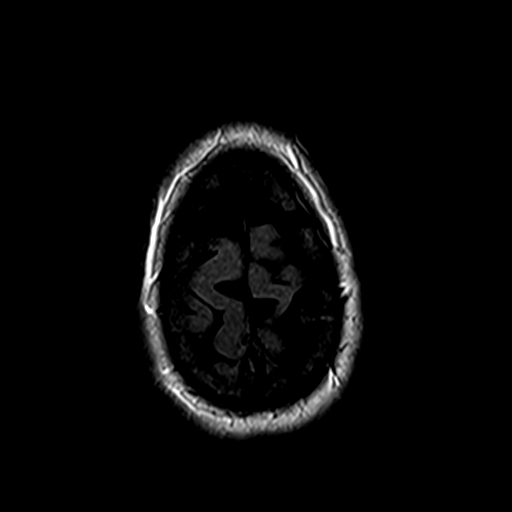

[Series 10: swi_images · axial · 4.0mm · 0.90mm/px · z∈[-84,+54]mm · 3 of 36 slices shown]
[im 1/36]
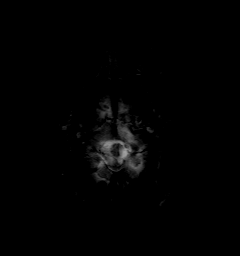
[im 18/36]
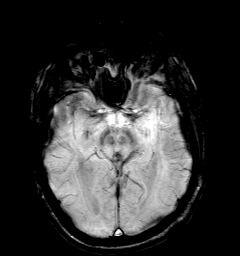
[im 36/36]
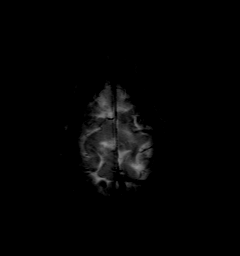

[Series 11: t1_mpr_tra · axial · 1.0mm · 0.71mm/px · z∈[-84,+57]mm · 12 of 144 slices shown]
[im 1/144]
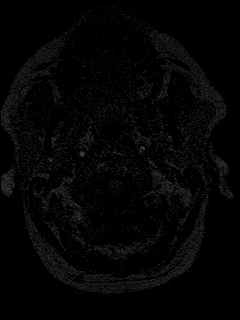
[im 14/144]
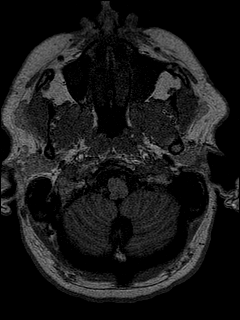
[im 27/144]
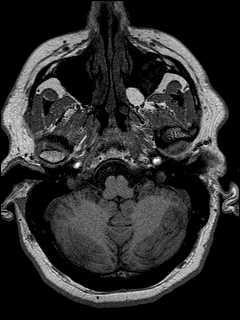
[im 40/144]
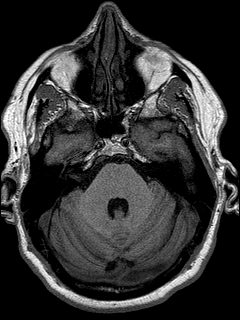
[im 53/144]
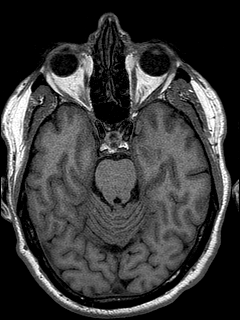
[im 66/144]
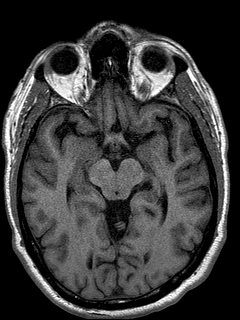
[im 79/144]
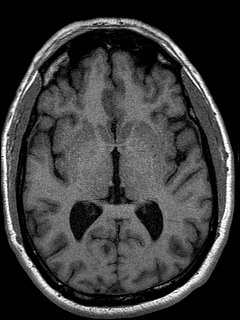
[im 92/144]
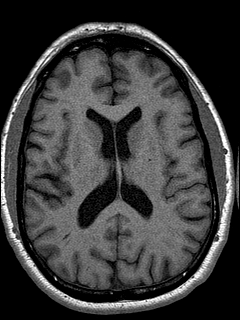
[im 105/144]
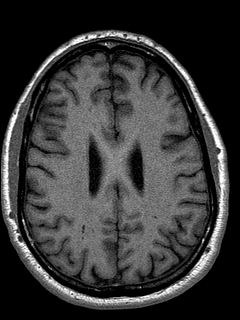
[im 118/144]
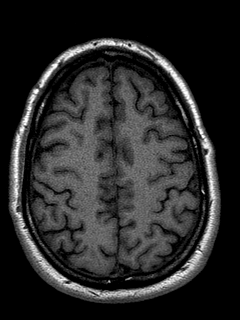
[im 131/144]
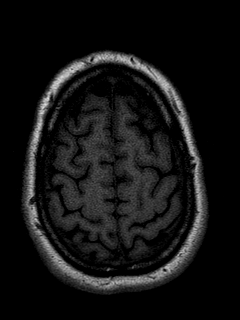
[im 144/144]
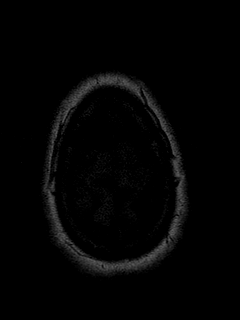

[Series 12: T2 · coronal · 5.0mm · 0.45mm/px · 2 of 28 slices shown (2 of 2)]
[im 1/28]
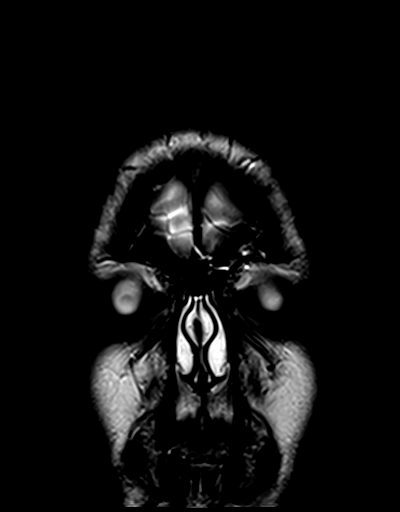
[im 28/28]
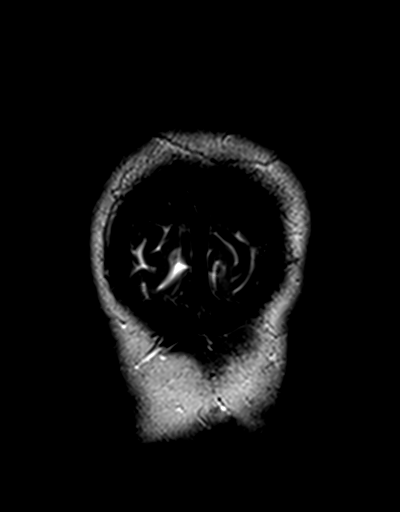

[Series 13: FLAIR · sagittal · 5.0mm · 0.45mm/px · 2 of 25 slices shown (2 of 2)]
[im 1/25]
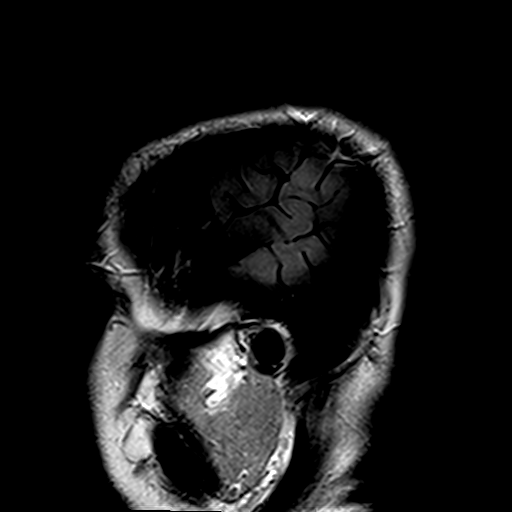
[im 25/25]
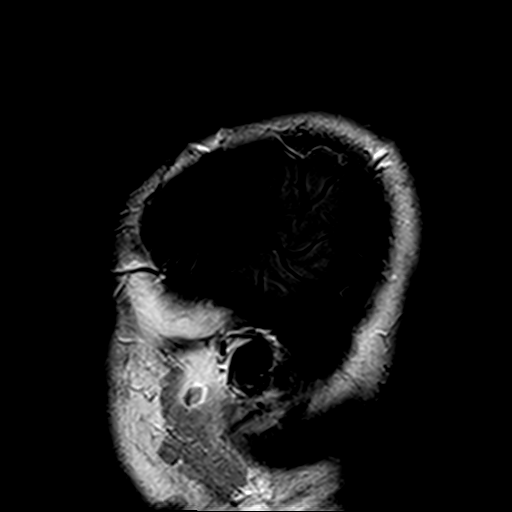

[48 of 48 positions shown; findings below may reference images not displayed]

FINDINGS: Brain:

There is no evidence of acute infarct.

No evidence of intracranial mass.

No midline shift or extra-axial fluid collection.

No chronic intracranial blood products.

Chronic lacunar infarcts within the right basal ganglia and left
cerebellum. Additional mild scattered T2/FLAIR hyperintensity within
cerebral white matter is nonspecific.

Cerebral volume is normal for age.

Vascular: Flow voids maintained within the proximal large arterial
vessels.

Skull and upper cervical spine: No focal marrow lesion

Sinuses/Orbits: Large left maxillary sinus mucous retention cyst.
There is an additional smaller mucous retention cysts within the
left maxillary sinus. Small right maxillary sinus mucous retention
cyst. Mild frontal and ethmoid sinus mucosal thickening. No
significant mastoid effusion.
IMPRESSION: No evidence of acute intracranial abnormality.

Chronic lacunar infarcts within the right basal ganglia and left
cerebellum.

Mild scattered T2/FLAIR hyperintensity within the cerebral white
matter is nonspecific, but most commonly seen on the basis of
chronic small vessel ischemic disease.

Paranasal sinus disease as described with bilateral maxillary sinus
mucous retention cysts.

## 2021-06-28 ENCOUNTER — Other Ambulatory Visit: Payer: Self-pay | Admitting: Family Medicine

## 2021-06-28 DIAGNOSIS — I10 Essential (primary) hypertension: Secondary | ICD-10-CM

## 2021-07-18 ENCOUNTER — Other Ambulatory Visit: Payer: Self-pay | Admitting: Urology

## 2021-07-18 DIAGNOSIS — N281 Cyst of kidney, acquired: Secondary | ICD-10-CM

## 2021-08-06 ENCOUNTER — Ambulatory Visit
Admission: RE | Admit: 2021-08-06 | Discharge: 2021-08-06 | Disposition: A | Discharge status: Home / Self Care | Payer: Commercial Managed Care - PPO | Source: Ambulatory Visit | Attending: Urology | Admitting: Urology

## 2021-08-06 DIAGNOSIS — N281 Cyst of kidney, acquired: Secondary | ICD-10-CM

## 2021-08-06 MED ORDER — GADOBENATE DIMEGLUMINE 529 MG/ML IV SOLN
18.0000 mL | Freq: Once | INTRAVENOUS | Status: AC | PRN
  Administered 2021-08-06: 18 mL via INTRAVENOUS

## 2021-12-11 ENCOUNTER — Other Ambulatory Visit: Payer: Self-pay

## 2021-12-11 ENCOUNTER — Ambulatory Visit (HOSPITAL_COMMUNITY)
Admission: EM | Admit: 2021-12-11 | Discharge: 2021-12-11 | Disposition: A | Discharge status: Home / Self Care | Payer: Commercial Managed Care - PPO | Attending: Internal Medicine | Admitting: Internal Medicine

## 2021-12-11 ENCOUNTER — Ambulatory Visit (INDEPENDENT_AMBULATORY_CARE_PROVIDER_SITE_OTHER): Payer: Commercial Managed Care - PPO

## 2021-12-11 ENCOUNTER — Encounter (HOSPITAL_COMMUNITY): Payer: Self-pay | Admitting: Emergency Medicine

## 2021-12-11 DIAGNOSIS — T148XXA Other injury of unspecified body region, initial encounter: Secondary | ICD-10-CM

## 2021-12-11 DIAGNOSIS — M79661 Pain in right lower leg: Secondary | ICD-10-CM | POA: Diagnosis not present

## 2021-12-11 DIAGNOSIS — S80811A Abrasion, right lower leg, initial encounter: Secondary | ICD-10-CM

## 2021-12-11 MED ORDER — BACITRACIN ZINC 500 UNIT/GM EX OINT: 1.0000 | TOPICAL_OINTMENT | Freq: Two times a day (BID) | CUTANEOUS | 0 refills | Status: AC

## 2021-12-11 MED ORDER — TRAMADOL HCL 50 MG PO TABS: 50.0000 mg | ORAL_TABLET | Freq: Four times a day (QID) | ORAL | 0 refills | Status: AC | PRN

## 2021-12-11 MED ORDER — METHOCARBAMOL 500 MG PO TABS: 500.0000 mg | ORAL_TABLET | Freq: Every evening | ORAL | 0 refills | Status: AC | PRN
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
## 2021-12-11 NOTE — ED Notes (Signed)
Dr Leonides Grills evaluated patient in intake

## 2021-12-11 NOTE — ED Notes (Signed)
Cleansed right upper leg with wound cleanser
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
## 2021-12-11 NOTE — Discharge Instructions (Addendum)
Increase oral fluid intake Daily wound dressing changes Please do not drive or operate heavy machinery after taking muscle relaxants.  Muscle relaxants makes you drowsy. It is okay to wash the wound with mild soap and water.  Ensure that you wash off all the soap, pat the wound dry and apply your topical antibiotic ointment before the dressing. Take medications as prescribed If you notice redness, swelling, purulent discharge or fever/chills please return to urgent care to be reevaluated.

## 2021-12-22 ENCOUNTER — Encounter (HOSPITAL_COMMUNITY): Payer: Self-pay

## 2021-12-22 ENCOUNTER — Ambulatory Visit (HOSPITAL_COMMUNITY)
Admission: EM | Admit: 2021-12-22 | Discharge: 2021-12-22 | Disposition: A | Discharge status: Home / Self Care | Payer: Commercial Managed Care - PPO | Attending: Family Medicine | Admitting: Family Medicine

## 2021-12-22 DIAGNOSIS — L089 Local infection of the skin and subcutaneous tissue, unspecified: Secondary | ICD-10-CM | POA: Diagnosis not present

## 2021-12-22 DIAGNOSIS — T148XXA Other injury of unspecified body region, initial encounter: Secondary | ICD-10-CM

## 2021-12-22 MED ORDER — CEPHALEXIN 500 MG PO CAPS: 500.0000 mg | ORAL_CAPSULE | Freq: Three times a day (TID) | ORAL | 0 refills | Status: AC

## 2021-12-22 MED ORDER — MUPIROCIN 2 % EX OINT: 1.0000 | TOPICAL_OINTMENT | Freq: Two times a day (BID) | CUTANEOUS | 0 refills | Status: AC

## 2021-12-22 NOTE — Discharge Instructions (Addendum)
Take cephalexin 500 mg--1 capsule 3 times daily for 7 days  Put mupirocin ointment on the sore areas twice daily until improved

## 2021-12-22 NOTE — ED Triage Notes (Addendum)
Pt is here for follow up wound check on right leg . Pt states wound may be infected and it feels like something is stabbing him when he lays down to sleep .
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
## 2022-01-13 ENCOUNTER — Encounter (HOSPITAL_BASED_OUTPATIENT_CLINIC_OR_DEPARTMENT_OTHER): Payer: Commercial Managed Care - PPO | Attending: Internal Medicine | Admitting: Internal Medicine

## 2022-01-13 DIAGNOSIS — I129 Hypertensive chronic kidney disease with stage 1 through stage 4 chronic kidney disease, or unspecified chronic kidney disease: Secondary | ICD-10-CM | POA: Insufficient documentation

## 2022-01-13 DIAGNOSIS — E039 Hypothyroidism, unspecified: Secondary | ICD-10-CM | POA: Diagnosis not present

## 2022-01-13 DIAGNOSIS — I87311 Chronic venous hypertension (idiopathic) with ulcer of right lower extremity: Secondary | ICD-10-CM | POA: Diagnosis not present

## 2022-01-13 DIAGNOSIS — T798XXA Other early complications of trauma, initial encounter: Secondary | ICD-10-CM

## 2022-01-13 DIAGNOSIS — N183 Chronic kidney disease, stage 3 unspecified: Secondary | ICD-10-CM | POA: Diagnosis not present

## 2022-01-13 DIAGNOSIS — I872 Venous insufficiency (chronic) (peripheral): Secondary | ICD-10-CM | POA: Diagnosis not present

## 2022-01-13 DIAGNOSIS — L97812 Non-pressure chronic ulcer of other part of right lower leg with fat layer exposed: Secondary | ICD-10-CM | POA: Diagnosis not present

## 2022-01-13 DIAGNOSIS — G8929 Other chronic pain: Secondary | ICD-10-CM | POA: Diagnosis not present

## 2022-01-13 NOTE — Progress Notes (Signed)
[name] ([id number]) [phone] [id number] Page 1 of 4 Visit Report for 01/13/2022 Abuse Risk Screen Details Patient Name: Date of Service: [name], [name] 01/13/2022 8:00 A M Medical Record Number: [id number] Patient Account Number: [id number] Date of Birth/Sex: Treating RN: [birth year] (61 y.o. Cline Cools Primary Care [place] Dodgen: [place], [name] Other Clinician: Referring Layal Javid: Treating Kenzlei Runions/Extender: [name], Jessica [place], [name] Weeks in Treatment: 0 Abuse Risk Screen Items Answer ABUSE RISK SCREEN: Has anyone close to you tried to hurt or harm you recentlyo No Do you feel uncomfortable with anyone in your familyo No Has anyone forced you do things that you didnt want to doo No Electronic Signature(s) Signed: 01/13/2022 3:12:14 PM By: Redmond Pulling RN, BSN Entered By: Redmond Pulling on 01/13/2022 08:13:48 -------------------------------------------------------------------------------- Activities of Daily Living Details Patient Name: Date of Service: [name], [name] 01/13/2022 8:00 A M Medical Record Number: [id number] Patient Account Number: [id number] Date of Birth/Sex: Treating RN: 1960-09-13 (61 y.o. Cline Cools Primary Care Theona Muhs: [place], [name] Other Clinician: Referring Amia Rynders: Treating Leonides Minder/Extender: [name], Jessica [place], [name] Weeks in Treatment: 0 Activities of Daily Living Items Answer Activities of Daily Living (Please select one for each item) Drive Automobile Completely Able T Medications ake Completely Able Use T elephone Completely Able Care for Appearance Completely Able Use T oilet Completely Able Bath / Shower Completely Able Dress Self Completely Able Feed Self Completely Able Walk Completely Able Get In / Out Bed Completely Able Housework Completely Able Prepare Meals Completely Able Handle Money Completely Able Shop for Self Completely Able Electronic Signature(s) Signed:  01/13/2022 3:12:14 PM By: Redmond Pulling RN, BSN Entered By: Redmond Pulling on 01/13/2022 08:14:13 Karapetyan, Warnell Bureau ([id number]) [id number] Page 2 of 4 -------------------------------------------------------------------------------- Education Screening Details Patient Name: Date of Service: [name] 01/13/2022 8:00 A M Medical Record Number: [id number] Patient Account Number: [id number] Date of Birth/Sex: Treating RN: 1961-02-16 (61 y.o. Cline Cools Primary Care Jaun Galluzzo: [place], [name] Other Clinician: Referring Paiten Boies: Treating Yuniel Blaney/Extender: [name], Jessica [place], [name] Weeks in Treatment: 0 Learning Preferences/Education Level/Primary Language Learning Preference: Explanation, Demonstration, Printed Material Preferred Language: English Cognitive Barrier Language Barrier: No Translator Needed: No Memory Deficit: No Emotional Barrier: No Cultural/Religious Beliefs Affecting Medical Care: No Physical Barrier Impaired Vision: No Impaired Hearing: Yes Hearing Aid, both ears Decreased Hand dexterity: No Knowledge/Comprehension Knowledge Level: High Comprehension Level: High Ability to understand written instructions: High Ability to understand verbal instructions: High Motivation Anxiety Level: Calm Cooperation: Cooperative Education Importance: Acknowledges Need Interest in Health Problems: Asks Questions Perception: Coherent Willingness to Engage in Self-Management High Activities: Readiness to Engage in Self-Management High Activities: Electronic Signature(s) Signed: 01/13/2022 3:12:14 PM By: Redmond Pulling RN, BSN Entered By: Redmond Pulling on 01/13/2022 08:14:55 -------------------------------------------------------------------------------- Fall Risk Assessment Details Patient Name: Date of Service: [name], [name] Lamas B. 01/13/2022 8:00 A M Medical Record Number: [id number] Patient Account Number:  [id number] Date of Birth/Sex: Treating RN: 1960/10/20 (61 y.o. Cline Cools Primary Care [place] Green[place]: [place], [name] Other Clinician: Referring Stephaniemarie Stoffel: Treating Jonell Krontz/Extender: [name], Jessica [place], [name] Weeks in Treatment: 0 Fall Risk Assessment Items Have you had 2 or more falls in the last 12 monthso 0 No Have you had any fall that resulted in injury in the last 12 monthso 0 No FALLS RISK SCREEN CAINEN, BURNHAM B ([id number]) [phone] [id number] Page 3 of 4 History of falling - immediate or within 3 months 0 No Secondary diagnosis (Do you have 2 or more medical diagnoseso) 0 No Ambulatory aid None/bed rest/wheelchair/nurse  0 No Crutches/cane/walker 0 No Furniture 0 No Intravenous therapy Access/Saline/Heparin Lock 0 No Gait/Transferring Normal/ bed rest/ wheelchair 0 No Weak (short steps with or without shuffle, stooped but able to lift head while walking, may seek 0 No support from furniture) Impaired (short steps with shuffle, may have difficulty arising from chair, head down, impaired 0 No balance) Mental Status Oriented to own ability 0 No Electronic Signature(s) Signed: 01/13/2022 3:12:14 PM By: Sharyn Creamer RN, BSN Entered By: Sharyn Creamer on 01/13/2022 08:15:22 -------------------------------------------------------------------------------- Foot Assessment Details Patient Name: Date of Service: Keith Nones B. 01/13/2022 8:00 A M Medical Record Number: [id number] Patient Account Number: [id number] Date of Birth/Sex: Treating RN: [birth year] (61 y.o. [name] Primary Care [place] Yeats: [name], [name] Other Clinician: Referring Ewen Varnell: Treating Nidhi Jacome/Extender: [name], Jessica [name], [name] Weeks in Treatment: 0 Foot Assessment Items Site Locations + = Sensation present, - = Sensation absent, C = Callus, U = Ulcer R = Redness, W = Warmth, M = Maceration, PU = Pre-ulcerative lesion F = Fissure, S = Swelling, D =  Dryness Assessment Right: Left: Other Deformity: No No Prior Foot Ulcer: No No Prior Amputation: No No Charcot Joint: No No Ambulatory Status: GaitNACHMEN, MANSEL B ([id number]) [id number] Page 4 of 4 Electronic Signature(s) Signed: 01/13/2022 3:12:14 PM By: Sharyn Creamer RN, BSN Entered By: Sharyn Creamer on 01/13/2022 08:24:44 -------------------------------------------------------------------------------- Nutrition Risk Screening Details Patient Name: Date of Service: [place], [place] 01/13/2022 8:00 A M Medical Record Number: [id number] Patient Account Number: [id number] Date of Birth/Sex: Treating RN: 12/24/1960 (62 y.o. [name] Primary Care Jedidiah Demartini: [name], [name] Other Clinician: Referring Emine Lopata: Treating Hayden Kihara/Extender: [name], Jessica [name], [name] Weeks in Treatment: 0 Height (in): Weight (lbs): Body Mass Index (BMI): Nutrition Risk Screening Items Score Screening NUTRITION RISK SCREEN: I have an illness or condition that made me change the kind and/or amount of food I eat 0 No I eat fewer than two meals per day 0 No I eat few fruits and vegetables, or milk products 0 No I have three or more drinks of beer, liquor or wine almost every day 0 No I have tooth or mouth problems that make it hard for me to eat 0 No I don't always have enough money to buy the food I need 0 No I eat alone most of the time 0 No I take three or more different prescribed or over-the-counter drugs a day 1 Yes Without wanting to, I have lost or gained 10 pounds in the last six months 0 No I am not always physically able to shop, cook and/or feed myself 0 No Nutrition Protocols Good Risk Protocol Moderate Risk Protocol High Risk Proctocol Risk Level: Good Risk Score: 1 Electronic Signature(s) Signed: 01/13/2022 3:12:14 PM By: Sharyn Creamer RN, BSN Entered By: Sharyn Creamer on 01/13/2022 08:15:43

## 2022-01-13 NOTE — Progress Notes (Signed)
[name] ([id number]) [id number] Page 1 of 10 Visit Report for 01/13/2022 Allergy List Details Patient Name: Date of Service: [name], [name] 01/13/2022 8:00 A M Medical Record Number: [id number] Patient Account Number: [id number] Date of Birth/Sex: Treating RN: Aug 30, 1960 (61 y.o. [name] Primary Care [name]: [name] Other Clinician: Referring [name]: Treating [name]/Extender: [name], [name] [name] Weeks in Treatment: 0 Allergies Active Allergies Shellfish Containing Products Severity: Severe bee sting kit Severity: Severe latex penicillin Sulfa (Sulfonamide Antibiotics) Statins-HMG-CoA Reductase Inhibitors Allergy Notes Electronic Signature(s) Signed: 01/13/2022 3:12:14 PM By: Sharyn Creamer RN, BSN Entered By: Sharyn Creamer on 01/13/2022 07:58:55 -------------------------------------------------------------------------------- Arrival Information Details Patient Name: Date of Service: [name] 01/13/2022 8:00 A M Medical Record Number: [id number] Patient Account Number: [id number] Date of Birth/Sex: Treating RN: 01-23-1961 (61 y.o. [name] Primary Care Kenneshia Rehm: [name] Other Clinician: Referring [name]: Treating [name]/Extender: [name], [name] [name] Weeks in Treatment: 0 Visit Information Patient Arrived: Ambulatory Arrival Time: 08:05 Accompanied By: self Transfer Assistance: None Patient Identification Verified: Yes Secondary Verification Process Completed: Yes Electronic Signature(s) Signed: 01/13/2022 3:12:14 PM By: Sharyn Creamer RN, BSN Entered By: Sharyn Creamer on 01/13/2022 35:57:32 [name] ([id number]) [id number] Page 2 of 10 -------------------------------------------------------------------------------- Clinic Level of Care Assessment Details Patient Name: Date of Service: [name] 01/13/2022 8:00 A  M Medical Record Number: [id number] Patient Account Number: [id number] Date of Birth/Sex: Treating RN: 1960-04-19 (61 y.o. [name] Primary Care [name]: [name] Other Clinician: Referring [name]: Treating Marlana Mckowen/Extender: [name], [name] [name] Weeks in Treatment: 0 Clinic Level of Care Assessment Items TOOL 1 Quantity Score X- 1 0 Use when [name] and Procedure is performed on INITIAL visit ASSESSMENTS - Nursing Assessment / Reassessment X- 1 20 General Physical Exam (combine w/ comprehensive assessment (listed just below) when performed on new pt. evals) X- 1 25 Comprehensive Assessment (HX, ROS, Risk Assessments, Wounds Hx, etc.) ASSESSMENTS - Wound and Skin Assessment / Reassessment _0  - 0 Dermatologic / Skin Assessment (not related to wound area) ASSESSMENTS - Ostomy and/or Continence Assessment and Care _1  - 0 Incontinence Assessment and Management _2  - 0 Ostomy Care Assessment and Management (repouching, etc.) PROCESS - Coordination of Care X - Simple Patient / Family Education for ongoing care 1 15 _3  - 0 Complex (extensive) Patient / Family Education for ongoing care X- 1 10 Staff obtains Programmer, systems, Records, T Results / Process Orders est _4  - 0 Staff telephones HHA, Nursing Homes / Clarify orders / etc _5  - 0 Routine Transfer to another Facility (non-emergent condition) _6  - 0 Routine Hospital Admission (non-emergent condition) X- 1 15 New Admissions / Biomedical engineer / Ordering NPWT Apligraf, etc. , _7  - 0 Emergency Hospital Admission (emergent condition) PROCESS - Special Needs _8  - 0 Pediatric / Minor Patient Management _9  - 0 Isolation Patient Management _10  - 0 Hearing / Language / Visual special needs _11  - 0 Assessment of Community assistance (transportation, D/C planning, etc.) _12  - 0 Additional assistance / Altered mentation _13  - 0 Support Surface(s) Assessment (bed, cushion, seat, etc.) INTERVENTIONS -  Miscellaneous _14  - 0 External ear exam _15  - 0 Patient Transfer (multiple staff / Civil Service fast streamer / Similar devices) _16  - 0 Simple Staple / Suture removal (25 or less) _17  - 0 Complex Staple / Suture removal (26 or more) _18  - 0 Hypo/Hyperglycemic Management (do not check if billed separately) X- 1 15 Ankle / Brachial Index (ABI) - do not check if billed separately  Has the patient been seen at the hospital within the last three years: Yes Total Score: 100 Level Of Care: New/Established - Level 3 Electronic Signature(s) Signed: 01/13/2022 3:12:14 PM By: Sharyn Creamer RN, BSN [name], Signed: 01/13/2022 3:12:14 PM By: Sharyn Creamer RN, BSN [name] ([id number]) [id number] Page 3 of 10 Entered By: Sharyn Creamer on 01/13/2022 10:25:26 -------------------------------------------------------------------------------- Encounter Discharge Information Details Patient Name: Date of Service: [name] 01/13/2022 8:00 A M Medical Record Number: [id number] Patient Account Number: [id number] Date of Birth/Sex: Treating RN: 03-08-1961 (61 y.o. [name] Primary Care [name]: [name] Other Clinician: Referring [name]: Treating [name]/Extender: [name], [name] [name] Weeks in Treatment: 0 Encounter Discharge Information Items Post Procedure Vitals Discharge Condition: Stable Temperature (F): 97.9 Ambulatory Status: Ambulatory Pulse (bpm): 61 Discharge Destination: Home Respiratory Rate (breaths/min): 18 Transportation: Private Auto Blood Pressure (mmHg): 141/89 Accompanied By: self Schedule Follow-up Appointment: Yes Clinical Summary of Care: Patient Declined Electronic Signature(s) Signed: 01/13/2022 3:12:14 PM By: Sharyn Creamer RN, BSN Entered By: Sharyn Creamer on 01/13/2022 10:27:58 -------------------------------------------------------------------------------- Lower Extremity Assessment Details Patient Name: Date of  Service: [place], Natasha Mead Mooney. 01/13/2022 8:00 A M Medical Record Number: [id number] Patient Account Number: [id number] Date of Birth/Sex: Treating RN: 10/10/1960 (61 y.o. [name] Primary Care Delilah Mulgrew: [name] Other Clinician: Referring Saray Capasso: Treating Amed Datta/Extender: [name], [name] [name] Weeks in Treatment: 0 Edema Assessment Assessed: [Left: No] [Right: Yes] [Left: Edema] [Right: :] Calf Left: Right: Point of Measurement: From Medial Instep 38.5 cm Ankle Left: Right: Point of Measurement: From Medial Instep 24.5 cm Vascular Assessment Pulses: Dorsalis Pedis Palpable: [Right:Yes] Blood Pressure: Brachial: [Right:141] Ankle: [Right:Dorsalis Pedis: 185] Ankle Brachial Index: [Right:1.31] Lukens, Edvardo Mooney ([id number]) [[id number] Page 4 of 10] Electronic Signature(s) Signed: 01/13/2022 3:12:14 PM By: Sharyn Creamer RN, BSN Entered By: Sharyn Creamer on 01/13/2022 08:45:16 -------------------------------------------------------------------------------- Multi Wound Chart Details Patient Name: Date of Service: [place], [facility] 01/13/2022 8:00 A M Medical Record Number: [id number] Patient Account Number: [id number] Date of Birth/Sex: Treating RN: November 24, 1960 (61 y.o. M) Primary Care Carita Sollars: [name] Other Clinician: Referring Alfonso Carden: Treating Prisila Dlouhy/Extender: [name], [name] [name] Weeks in Treatment: 0 Vital Signs Height(in): Pulse(bpm): 61 Weight(lbs): Blood Pressure(mmHg): 141/89 Body Mass Index(BMI): Temperature(F): 97.9 Respiratory Rate(breaths/min): 18 [1:Photos:] [N/A:N/A] Right, Medial Lower Leg Right, Distal, Medial Lower Leg N/A Wound Location: Trauma Trauma N/A Wounding Event: Trauma, Other Trauma, Other N/A Primary Etiology: Hypertension Hypertension N/A Comorbid History: 12/13/2021 12/13/2021 N/A Date Acquired: 0 0 N/A Weeks of Treatment: Open Open N/A Wound  Status: No No N/A Wound Recurrence: No Yes N/A Clustered Wound: 0.9x1x0.1 1.4x2x0.1 N/A Measurements L x W x D (cm) 0.707 2.199 N/A A (cm) : rea 0.071 0.22 N/A Volume (cm) : Full Thickness Without Exposed Full Thickness Without Exposed N/A Classification: Support Structures Support Structures Medium Medium N/A Exudate A mount: Serosanguineous Serosanguineous N/A Exudate Type: red, brown red, brown N/A Exudate Color: None Present (0%) None Present (0%) N/A Granulation A mount: Large (67-100%) Large (67-100%) N/A Necrotic A mount: Eschar, Adherent Slough Eschar, Adherent Slough N/A Necrotic Tissue: Fat Layer (Subcutaneous Tissue): Yes Fat Layer (Subcutaneous Tissue): Yes N/A Exposed Structures: Fascia: No Fascia: No Tendon: No Tendon: No Muscle: No Muscle: No Joint: No Joint: No Bone: No Bone: No None None N/A Epithelialization: Debridement - Excisional Debridement - Excisional N/A Debridement: Pre-procedure Verification/Time Out 09:00 09:00 N/A Taken: Lidocaine 5% topical ointment Lidocaine 5% topical ointment N/A Pain Control: Necrotic/Eschar, Subcutaneous, Necrotic/Eschar, Subcutaneous, N/A Tissue Debrided: [employer]  Tissue Skin/Subcutaneous Tissue N/A Level: 0.9 2.8 N/A Debridement A (sq cm): rea Curette Curette N/A Instrument: Minimum Minimum N/A Bleeding: Pressure Pressure N/A Hemostasis Achieved: 4 4 N/A Procedural Pain: 1 1 N/A Post Procedural PainMARQUIE, [name] ([id number]) [id number] Page 5 of 10 Procedure was tolerated well Procedure was tolerated well N/A Debridement Treatment Response: 0.9x1x0.1 1.4x2x0.1 N/A Post Debridement Measurements L x W x D (cm) 0.071 0.22 N/A Post Debridement Volume: (cm) No Abnormalities Noted No Abnormalities Noted N/A Periwound Skin Texture: Erythema: Yes Erythema: Yes N/A Periwound Skin Color: No Abnormality No Abnormality  N/A Temperature: Debridement Debridement N/A Procedures Performed: Treatment Notes Electronic Signature(s) Signed: 01/13/2022 10:03:43 AM By: Kalman Shan DO Entered By: Kalman Shan on 01/13/2022 09:07:11 -------------------------------------------------------------------------------- Multi-Disciplinary Care Plan Details Patient Name: Date of Service: Keith Nones Mooney. 01/13/2022 8:00 A M Medical Record Number: [id number] Patient Account Number: [id number] Date of Birth/Sex: Treating RN: 1961-03-05 (61 y.o. [name] Primary Care Jasmen Emrich: [name] Other Clinician: Referring Kenry Daubert: Treating Glennie Bose/Extender: [name], [name] [name] Weeks in Treatment: 0 Active Inactive Wound/Skin Impairment Nursing Diagnoses: Impaired tissue integrity Knowledge deficit related to ulceration/compromised skin integrity Goals: Patient/caregiver will verbalize understanding of skin care regimen Date Initiated: 01/13/2022 Target Resolution Date: 02/10/2022 Goal Status: Active Ulcer/skin breakdown will have a volume reduction of 30% by week 4 Date Initiated: 01/13/2022 Target Resolution Date: 02/10/2022 Goal Status: Active Interventions: Assess patient/caregiver ability to obtain necessary supplies Assess patient/caregiver ability to perform ulcer/skin care regimen upon admission and as needed Assess ulceration(s) every visit Provide education on ulcer and skin care Notes: Electronic Signature(s) Signed: 01/13/2022 3:12:14 PM By: Sharyn Creamer RN, BSN Entered By: Sharyn Creamer on 01/13/2022 08:51:23 -------------------------------------------------------------------------------- Pain Assessment Details Patient Name: Date of Service: Keith Nones Mooney. 01/13/2022 8:00 [place] Record Number: [id number] Patient Account Number: [id number] CAIUS, SILBERNAGEL ([id number]) [id number] Page 6 of 10 Date of Birth/Sex: Treating  RN: 1960/08/03 (61 y.o. Hessie Diener Primary Care Undra Harriman: Other Clinician: [name], Mooney Referring Medhansh Brinkmeier: Treating Jalacia Mattila/Extender: [name], [name] [name] Weeks in Treatment: 0 Active Problems Location of Pain Severity and Description of Pain Patient Has Paino Yes Site Locations Rate the pain. Current Pain Level: 1 Worst Pain Level: 3 Pain Management and Medication Current Pain Management: Electronic Signature(s) Signed: 01/13/2022 6:12:04 PM By: Deon Pilling RN, BSN Entered By: Deon Pilling on 01/13/2022 08:35:55 -------------------------------------------------------------------------------- Patient/Caregiver Education Details Patient Name: Date of Service: [name] 10/10/2023andnbsp8:00 [place] Record Number: [id number] Patient Account Number: [id number] Date of Birth/Gender: Treating RN: July 14, 1960 (61 y.o. [name] Primary Care Physician: [name] Other Clinician: Referring Physician: Treating Physician/Extender: [name], [name] [name] Weeks in Treatment: 0 Education Assessment Education Provided To: Patient Education Topics Provided Wound/Skin Impairment: Methods: Explain/Verbal Responses: State content correctly Electronic Signature(s) Signed: 01/13/2022 3:12:14 PM By: Sharyn Creamer RN, BSN Entered By: Sharyn Creamer on 01/13/2022 08:51:34 Jett, Rod Can ([id number]) [id number] Page 7 of 10 -------------------------------------------------------------------------------- Wound Assessment Details Patient Name: Date of Service: [name], [name] 01/13/2022 8:00 A M Medical Record Number: [id number] Patient Account Number: [id number] Date of Birth/Sex: Treating RN: 1960-07-27 (61 y.o. [name] Primary Care Marquetta Weiskopf: [name] Other Clinician: Referring May Ozment: Treating Jajaira Ruis/Extender: [name], [name] [name] Weeks in Treatment: 0 Wound Status Wound  Number: 1 Primary Etiology: Trauma, Other Wound Location: Right, Medial Lower Leg Wound Status: Open Wounding Event: Trauma Comorbid History: Hypertension Date Acquired: 12/13/2021 Weeks Of Treatment: 0 Clustered Wound: No Photos Wound Measurements Length: (cm)  0.9 Width: (cm) 1 Depth: (cm) 0.1 Area: (cm) 0.707 Volume: (cm) 0.071 % Reduction in Area: % Reduction in Volume: Epithelialization: None Tunneling: No Undermining: No Wound Description Classification: Full Thickness Without Exposed Support Structures Exudate Amount: Medium Exudate Type: Serosanguineous Exudate Color: red, brown Foul Odor After Cleansing: No Slough/Fibrino Yes Wound Bed Granulation Amount: None Present (0%) Exposed Structure Necrotic Amount: Large (67-100%) Fascia Exposed: No Necrotic Quality: Eschar, Adherent Slough Fat Layer (Subcutaneous Tissue) Exposed: Yes Tendon Exposed: No Muscle Exposed: No Joint Exposed: No Bone Exposed: No Periwound Skin Texture Texture Color No Abnormalities Noted: Yes No Abnormalities Noted: No Erythema: Yes Moisture No Abnormalities Noted: No Temperature / Pain Temperature: No Abnormality Treatment Notes Wound #1 (Lower Leg) Wound Laterality: Right, Medial Cleanser Wound Cleanser Discharge Instruction: Cleanse the wound with wound cleanser prior to applying a clean dressing using gauze sponges, not tissue or cotton balls. Peri-Wound Care NADIA, TORR ([id number]) [id number] Page 8 of 10 Topical Primary Dressing Hydrofera Blue Classic Foam, 2x2 in Discharge Instruction: Moisten with saline prior to applying to wound bed MediHoney Gel, tube 1.5 (oz) Discharge Instruction: Apply to wound bed as instructed Secondary Dressing ALLEVYN Gentle Border, 4x4 (in/in) Discharge Instruction: Apply over primary dressing as directed. Woven Gauze Sponge, Non-Sterile 4x4 in Discharge Instruction: Apply over primary dressing as  directed. Secured With Elastic Bandage 4 inch (ACE bandage) Discharge Instruction: Secure with ACE bandage as directed. 37M Medipore Soft Cloth Surgical T 2x10 (in/yd) ape Discharge Instruction: Secure with tape as directed. Compression Wrap Compression Stockings Add-Ons Electronic Signature(s) Signed: 01/13/2022 3:12:14 PM By: Sharyn Creamer RN, BSN Signed: 01/13/2022 6:12:04 PM By: Deon Pilling RN, BSN Entered By: Deon Pilling on 01/13/2022 08:35:07 -------------------------------------------------------------------------------- Wound Assessment Details Patient Name: Date of Service: [name], Keith Vale Mooney. 01/13/2022 8:00 A M Medical Record Number: [id number] Patient Account Number: [id number] Date of Birth/Sex: Treating RN: 1961-02-20 (61 y.o. [name] Primary Care Ricahrd Schwager: [name] Other Clinician: Referring Nachman Sundt: Treating Lajuana Patchell/Extender: [name], [name] [name] Weeks in Treatment: 0 Wound Status Wound Number: 2 Primary Etiology: Trauma, Other Wound Location: Right, Distal, Medial Lower Leg Wound Status: Open Wounding Event: Trauma Comorbid History: Hypertension Date Acquired: 12/13/2021 Weeks Of Treatment: 0 Clustered Wound: Yes Photos Wound Measurements Length: (cm) 1.4 Width: (cm) 2 Depth: (cm) 0.1 Abee, Mehdi Mooney ([id number]) Area: (cm) 2.199 Volume: (cm) 0.22 % Reduction in Area: % Reduction in Volume: Epithelialization: None [id number] Page 9 of 10 Tunneling: No Undermining: No Wound Description Classification: Full Thickness Without Exposed Support Structures Exudate Amount: Medium Exudate Type: Serosanguineous Exudate Color: red, brown Foul Odor After Cleansing: No Slough/Fibrino Yes Wound Bed Granulation Amount: None Present (0%) Exposed Structure Necrotic Amount: Large (67-100%) Fascia Exposed: No Necrotic Quality: Eschar, Adherent Slough Fat Layer (Subcutaneous Tissue) Exposed: Yes Tendon  Exposed: No Muscle Exposed: No Joint Exposed: No Bone Exposed: No Periwound Skin Texture Texture Color No Abnormalities Noted: Yes No Abnormalities Noted: No Erythema: Yes Moisture No Abnormalities Noted: No Temperature / Pain Temperature: No Abnormality Treatment Notes Wound #2 (Lower Leg) Wound Laterality: Right, Medial, Distal Cleanser Peri-Wound Care Topical Primary Dressing Secondary Dressing Secured With Compression Wrap Compression Stockings Add-Ons Electronic Signature(s) Signed: 01/13/2022 3:12:14 PM By: Sharyn Creamer RN, BSN Entered By: Sharyn Creamer on 01/13/2022 08:37:08 -------------------------------------------------------------------------------- [place] Details Patient Name: Date of Service: [place], [place] 01/13/2022 8:00 A M Medical Record Number: [id number] Patient Account Number: [id number] Date of Birth/Sex: Treating RN: 1960/08/19 (61 y.o. [name] Primary Care Laniesha Das: [name] Other Clinician:  Referring Kissie Ziolkowski: Treating Auden Wettstein/Extender: [name], [name] [name] Weeks in Treatment: 0 Vital Signs Time Taken: 08:20 Temperature (F): 97.9 Pulse (bpm): 61 Respiratory Rate (breaths/min): 18 Blood Pressure (mmHg): 141/89 Reference Range: 80 - 120 mg / dl LELA, GELL Mooney ([id number]) [id number] Page 10 of 10 Electronic Signature(s) Signed: 01/13/2022 3:12:14 PM By: Sharyn Creamer RN, BSN Entered By: Sharyn Creamer on 01/13/2022 08:31:32

## 2022-01-13 NOTE — Progress Notes (Signed)
JERICO, GRISSO ([id number]) [id number] Page 1 of 9 Visit Report for 01/13/2022 Chief Complaint Document Details Patient Name: Date of Service: [name], [name] 01/13/2022 8:00 A M Medical Record Number: [id number] Patient Account Number: [id number] Date of Birth/Sex: Treating RN: [birth year] (61 y.o. M) Primary Care Provider: [name], [name] Other Clinician: Referring Provider: Treating Provider/Extender: [name] [name], [name] Weeks in Treatment: 0 Information Obtained from: Patient Chief Complaint 01/13/2022; wounds to the right lower extremity secondary to trauma Electronic Signature(s) Signed: 01/13/2022 10:03:43 AM By: Kalman Shan DO Entered By: Kalman Shan on 01/13/2022 09:07:36 -------------------------------------------------------------------------------- Debridement Details Patient Name: Date of Service: [name]. 01/13/2022 8:00 A M Medical Record Number: [id number] Patient Account Number: [id number] Date of Birth/Sex: Treating RN: 28-Apr-1960 (61 y.o. [name] Primary Care Provider: [name], [name] Other Clinician: Referring Provider: Treating Provider/Extender: [name] [name], [name] Weeks in Treatment: 0 Debridement Performed for Assessment: Wound #1 Right,Medial Lower Leg Performed By: Physician Kalman Shan, DO Debridement Type: Debridement Level of Consciousness (Pre-procedure): Awake and Alert Pre-procedure Verification/Time Out Yes - 09:00 Taken: Start Time: 09:00 Pain Control: Lidocaine 5% topical ointment T Area Debrided (L x W): otal 0.9 (cm) x 1 (cm) = 0.9 (cm) Tissue and other material debrided: Eschar, Slough, Subcutaneous, Slough Level: Skin/Subcutaneous Tissue Debridement Description: Excisional Instrument: Curette Bleeding: Minimum Hemostasis Achieved: Pressure Procedural Pain: 4 Post Procedural Pain: 1 Response to Treatment: Procedure was tolerated well Level of  Consciousness (Post- Awake and Alert procedure): Post Debridement Measurements of Total Wound Length: (cm) 0.9 Width: (cm) 1 Depth: (cm) 0.1 Volume: (cm) 0.071 Character of Wound/Ulcer Post Debridement: Improved Post Procedure Diagnosis Same as Pre-procedure [name], [name] ([id number]) [id number] Page 2 of 9 Notes Scribed for Dr. Heber [place] by C. Phineas Douglas, RN Electronic Signature(s) Signed: 01/13/2022 3:12:14 PM By: Sharyn Creamer RN, BSN Signed: 01/13/2022 4:32:14 PM By: Kalman Shan DO Previous Signature: 01/13/2022 10:03:43 AM Version By: Kalman Shan DO Entered By: Sharyn Creamer on 01/13/2022 15:09:10 -------------------------------------------------------------------------------- Debridement Details Patient Name: Date of Service: [name] 01/13/2022 8:00 A M Medical Record Number: [id number] Patient Account Number: [id number] Date of Birth/Sex: Treating RN: 04/18/1960 (61 y.o. [name] Primary Care Provider: [name], [name] Other Clinician: Referring Provider: Treating Provider/Extender: [name] [name], [name] Weeks in Treatment: 0 Debridement Performed for Assessment: Wound #2 Right,Distal,Medial Lower Leg Performed By: Physician Kalman Shan, DO Debridement Type: Debridement Level of Consciousness (Pre-procedure): Awake and Alert Pre-procedure Verification/Time Out Yes - 09:00 Taken: Start Time: 09:00 Pain Control: Lidocaine 5% topical ointment T Area Debrided (L x W): otal 1.4 (cm) x 2 (cm) = 2.8 (cm) Tissue and other material debrided: Non-Viable, Eschar, Slough, Subcutaneous, Slough Level: Skin/Subcutaneous Tissue Debridement Description: Excisional Instrument: Curette Bleeding: Minimum Hemostasis Achieved: Pressure Procedural Pain: 4 Post Procedural Pain: 1 Response to Treatment: Procedure was tolerated well Level of Consciousness (Post- Awake and Alert procedure): Post Debridement  Measurements of Total Wound Length: (cm) 1.4 Width: (cm) 2 Depth: (cm) 0.1 Volume: (cm) 0.22 Character of Wound/Ulcer Post Debridement: Improved Post Procedure Diagnosis Same as Pre-procedure Notes Scribed for Dr. Heber [place] by C. Phineas Douglas, RN Electronic Signature(s) Signed: 01/13/2022 3:12:14 PM By: Sharyn Creamer RN, BSN Signed: 01/13/2022 4:32:14 PM By: Kalman Shan DO Previous Signature: 01/13/2022 10:03:43 AM Version By: Kalman Shan DO Entered By: Sharyn Creamer on 01/13/2022 15:09:23 [name] ([id number]) [id number] Page 3 of 9 -------------------------------------------------------------------------------- HPI Details Patient Name: Date of Service: [name], [name]. 01/13/2022 8:00 A M Medical Record Number: [id number] Patient Account  Number: [id number] Date of Birth/Sex: Treating RN: 10/08/1960 (61 y.o. M) Primary Care Provider: [name], [name] Other Clinician: Referring Provider: Treating Provider/Extender: [name] [name], [name] Weeks in Treatment: 0 History of Present Illness HPI Description: Admission 01/13/2022 [name] is a 61 year old male with a past medical history of hypertension and CKD stage III that presents to the clinic for wounds to his right lower extremity. He states he was helping loads shingles when a [employer] tractor hit his shin causing open wounds. He visited the ED for this issue on 12/11/2021 and was prescribed Bactroban ointment. The following week he followed up and was given Keflex for potential cellulitis. He has been also following with his primary care physician for this issue. After he finished Keflex he has been prescribed doxycycline and is currently taking this. He denies systemic signs of infection currently. For the past 2 weeks he has been keeping the area open to air. He has some chronic mild pain to the wound sites. He denies increased warmth, erythema or purulent  drainage. Electronic Signature(s) Signed: 01/13/2022 10:03:43 AM By: Kalman Shan DO Entered By: Kalman Shan on 01/13/2022 09:10:13 -------------------------------------------------------------------------------- Physical Exam Details Patient Name: Date of Service: [name], [name] 01/13/2022 8:00 A M Medical Record Number: [id number] Patient Account Number: [id number] Date of Birth/Sex: Treating RN: 1961-02-11 (61 y.o. M) Primary Care Provider: [name], [name] Other Clinician: Referring Provider: Treating Provider/Extender: Ardelia Wrede [name], [name] Weeks in Treatment: 0 Constitutional respirations regular, non-labored and within target range for patient.. Cardiovascular 2+ dorsalis pedis/posterior tibialis pulses. Psychiatric pleasant and cooperative. Notes Right lower extremity: Multiple open wounds to the anterior aspect with nonviable tissue throughout. No increased warmth, erythema or purulent drainage. Electronic Signature(s) Signed: 01/13/2022 10:03:43 AM By: Kalman Shan DO Entered By: Kalman Shan on 01/13/2022 09:11:09 -------------------------------------------------------------------------------- Physician Orders Details Patient Name: Date of Service: [name], [name] 01/13/2022 8:00 A M Medical Record Number: [id number] Patient Account Number: [id number] Date of Birth/Sex: Treating RN: Aug 09, 1960 (61 y.o. [name] Primary Care Provider: [name], [name] Other Clinician: Referring Provider: Treating Provider/Extender: Denea Cheaney [name], [name] Weeks in Treatment: 0 Verbal / Phone Orders: No [name], [name] ([id number]) [id number] Page 4 of 9 Diagnosis Coding ICD-10 Coding Code Description T79.8XXA Other early complications of trauma, initial encounter L97.812 Non-pressure chronic ulcer of other part of right lower leg with fat layer exposed N18.30 Chronic kidney disease, stage 3  unspecified Follow-up Appointments ppointment in 1 week. - Dr [name] [place] - Room 9 Return A Anesthetic Wound #1 Right,Medial Lower Leg (In clinic) Topical Lidocaine 5% applied to wound bed Wound #2 Right,Distal,Medial Lower Leg (In clinic) Topical Lidocaine 5% applied to wound bed Bathing/ Shower/ Hygiene May shower and wash wound with soap and water. - leave bandage on in shower, remove at end of shower wash wound with dial antibacterial soap or wound cleanser, redress wound after shower Edema Control - Lymphedema / SCD / Other Right Lower Extremity Elevate legs to the level of the heart or above for 30 minutes daily and/or when sitting, a frequency of: Avoid standing for long periods of time. Exercise regularly Moisturize legs daily. Wound Treatment Wound #1 - Lower Leg Wound Laterality: Right, Medial Cleanser: Wound Cleanser 1 x Per Day/15 Days Discharge Instructions: Cleanse the wound with wound cleanser prior to applying a clean dressing using gauze sponges, not tissue or cotton balls. Prim Dressing: Hydrofera Blue Classic Foam, 2x2 in (DME) (Generic) 1 x Per Day/15 Days ary Discharge Instructions: Moisten with saline prior  to applying to wound bed Prim Dressing: MediHoney Gel, tube 1.5 (oz) 1 x Per Day/15 Days ary Discharge Instructions: Apply to wound bed as instructed Secondary Dressing: ALLEVYN Gentle Border, 4x4 (in/in) (DME) (Generic) 1 x Per Day/15 Days Discharge Instructions: Apply over primary dressing as directed. Secondary Dressing: Woven Gauze Sponge, Non-Sterile 4x4 in (DME) (Generic) 1 x Per Day/15 Days Discharge Instructions: Apply over primary dressing as directed. Secured With: Elastic Bandage 4 inch (ACE bandage) 1 x Per Day/15 Days Discharge Instructions: Secure with ACE bandage as directed. Secured With: 7M Medipore Public affairs consultant Surgical T 2x10 (in/yd) (DME) (Generic) 1 x Per Day/15 Days ape Discharge Instructions: Secure with tape as directed. Patient  Medications llergies: Shellfish Containing Products, bee sting kit, latex, penicillin, Sulfa (Sulfonamide Antibiotics), Statins-HMG-CoA Reductase Inhibitors A Notifications Medication Indication Start End 01/13/2022 lidocaine DOSE topical 5 % ointment - ointment topical Electronic Signature(s) Signed: 01/13/2022 10:03:43 AM By: Kalman Shan DO Signed: 01/13/2022 3:12:14 PM By: Sharyn Creamer RN, BSN Entered By: Sharyn Creamer on 01/13/2022 [name], [name] ([id number]) [id number] Page 5 of 9 -------------------------------------------------------------------------------- Problem List Details Patient Name: Date of Service: [name], [name] 01/13/2022 8:00 A M Medical Record Number: [id number] Patient Account Number: [id number] Date of Birth/Sex: Treating RN: 02/17/1961 (61 y.o. M) Primary Care Provider: [name], [name] Other Clinician: Referring Provider: Treating Provider/Extender: Chelsie Burel [name], [name] Weeks in Treatment: 0 Active Problems ICD-10 Encounter Code Description Active Date MDM Diagnosis L97.812 Non-pressure chronic ulcer of other part of right lower leg with fat layer 01/13/2022 No Yes exposed T79.8XXA Other early complications of trauma, initial encounter 01/13/2022 No Yes N18.30 Chronic kidney disease, stage 3 unspecified 01/13/2022 No Yes Inactive Problems Resolved Problems Electronic Signature(s) Signed: 01/13/2022 10:03:43 AM By: Kalman Shan DO Entered By: Kalman Shan on 01/13/2022 09:07:06 -------------------------------------------------------------------------------- Progress Note Details Patient Name: Date of Service: [name], [name] 01/13/2022 8:00 A M Medical Record Number: [id number] Patient Account Number: [id number] Date of Birth/Sex: Treating RN: 05-26-1960 (61 y.o. M) Primary Care Provider: [name], [name] Other Clinician: Referring Provider: Treating Provider/Extender: Kwadwo Taras,  Carissa Musick [name], [name] Weeks in Treatment: 0 Subjective Chief Complaint Information obtained from Patient 01/13/2022; wounds to the right lower extremity secondary to trauma History of Present Illness (HPI) Admission 01/13/2022 Mr. yafet cline is a 61 year old male with a past medical history of hypertension and CKD stage III that presents to the clinic for wounds to his right lower extremity. He states he was helping loads shingles when a [employer] tractor hit his shin causing open wounds. He visited the ED for this issue on 12/11/2021 and was prescribed Bactroban ointment. The following week he followed up and was given Keflex for potential cellulitis. He has been also following with his primary care physician for this issue. After he finished Keflex he has been prescribed doxycycline and is currently taking this. He denies systemic signs of infection currently. For the past 2 weeks he has been keeping the area open to air. He has some chronic mild pain to the wound sites. He denies increased warmth, erythema or purulent drainage. KEO, SCHIRMER ([id number]) [id number] Page 6 of 9 Patient History Allergies Shellfish Containing Products (Severity: Severe), bee sting kit (Severity: Severe), latex, penicillin, Sulfa (Sulfonamide Antibiotics), Statins-HMG-CoA Reductase Inhibitors Social History Never smoker, Marital Status - Married, Alcohol Use - Moderate, Drug Use - No History, Caffeine Use - Daily. Medical History Cardiovascular Patient has history of Hypertension Medical A Surgical History Notes nd Cardiovascular hypercholesteremia Endocrine hypothyroid Review of Systems (  ROS) Constitutional Symptoms (General Health) Denies complaints or symptoms of Fatigue, Fever, Chills, Marked Weight Change. Eyes Complains or has symptoms of Glasses / Contacts - contacts. Ear/Nose/Mouth/Throat Denies complaints or symptoms of Chronic sinus problems or  rhinitis. Respiratory Denies complaints or symptoms of Chronic or frequent coughs, Shortness of Breath. Gastrointestinal Denies complaints or symptoms of Frequent diarrhea, Nausea, Vomiting. Genitourinary Denies complaints or symptoms of Frequent urination. Integumentary (Skin) Complains or has symptoms of Wounds - Rt llower leg. Musculoskeletal Denies complaints or symptoms of Muscle Pain, Muscle Weakness. Neurologic lower leg Psychiatric Denies complaints or symptoms of Claustrophobia. Objective Constitutional respirations regular, non-labored and within target range for patient.. Vitals Time Taken: 8:20 AM, Temperature: 97.9 F, Pulse: 61 bpm, Respiratory Rate: 18 breaths/min, Blood Pressure: 141/89 mmHg. Cardiovascular 2+ dorsalis pedis/posterior tibialis pulses. Psychiatric pleasant and cooperative. General Notes: Right lower extremity: Multiple open wounds to the anterior aspect with nonviable tissue throughout. No increased warmth, erythema or purulent drainage. Integumentary (Hair, Skin) Wound #1 status is Open. Original cause of wound was Trauma. The date acquired was: 12/13/2021. The wound is located on the Right,Medial Lower Leg. The wound measures 0.9cm length x 1cm width x 0.1cm depth; 0.707cm^2 area and 0.071cm^3 volume. There is Fat Layer (Subcutaneous Tissue) exposed. There is no tunneling or undermining noted. There is a medium amount of serosanguineous drainage noted. There is no granulation within the wound bed. There is a large (67-100%) amount of necrotic tissue within the wound bed including Eschar and Adherent Slough. The periwound skin appearance had no abnormalities noted for texture. The periwound skin appearance exhibited: Erythema. The surrounding wound skin color is noted with erythema. Periwound temperature was noted as No Abnormality. Wound #2 status is Open. Original cause of wound was Trauma. The date acquired was: 12/13/2021. The wound is located on the  Right,Distal,Medial Lower Leg. The wound measures 1.4cm length x 2cm width x 0.1cm depth; 2.199cm^2 area and 0.22cm^3 volume. There is Fat Layer (Subcutaneous Tissue) exposed. There is no tunneling or undermining noted. There is a medium amount of serosanguineous drainage noted. There is no granulation within the wound bed. There is a large (67-100%) amount of necrotic tissue within the wound bed including Eschar and Adherent Slough. The periwound skin appearance had no abnormalities noted for texture. The periwound skin appearance exhibited: Erythema. The surrounding wound skin color is noted with erythema. Periwound temperature was noted as No Abnormality. Assessment Active Problems ICD-10 [name], [name] ([id number]) [id number] Page 7 of 9 Non-pressure chronic ulcer of other part of right lower leg with fat layer exposed Other early complications of trauma, initial encounter Chronic kidney disease, stage 3 unspecified Patient presents with a 1 month history of nonhealing ulcers to the right lower extremity secondary to trauma. I debrided nonviable tissue. No surrounding signs of infection. At this time I recommended Medihoney and Hydrofera Blue daily. He is still on doxycycline prescribed by his primary care physician. I recommended he complete the course. Follow-up in 1 week 46 minutes was spent on the encounter including face-to-face, EMR review and coordination of care Procedures Wound #1 Pre-procedure diagnosis of Wound #1 is a Trauma, Other located on the Right,Medial Lower Leg . There was a Excisional Skin/Subcutaneous Tissue Debridement with a total area of 0.9 sq cm performed by Kalman Shan, DO. With the following instrument(s): Curette Material removed includes Eschar, Subcutaneous Tissue, and Slough after achieving pain control using Lidocaine 5% topical ointment. No specimens were taken. A time out was conducted at 09:00, prior to  the start of the  procedure. A Minimum amount of bleeding was controlled with Pressure. The procedure was tolerated well with a pain level of 4 throughout and a pain level of 1 following the procedure. Post Debridement Measurements: 0.9cm length x 1cm width x 0.1cm depth; 0.071cm^3 volume. Character of Wound/Ulcer Post Debridement is improved. Post procedure Diagnosis Wound #1: Same as Pre-Procedure Wound #2 Pre-procedure diagnosis of Wound #2 is a Trauma, Other located on the Right,Distal,Medial Lower Leg . There was a Excisional Skin/Subcutaneous Tissue Debridement with a total area of 2.8 sq cm performed by Kalman Shan, DO. With the following instrument(s): Curette to remove Non-Viable tissue/material. Material removed includes Eschar, Subcutaneous Tissue, and Slough after achieving pain control using Lidocaine 5% topical ointment. No specimens were taken. A time out was conducted at 09:00, prior to the start of the procedure. A Minimum amount of bleeding was controlled with Pressure. The procedure was tolerated well with a pain level of 4 throughout and a pain level of 1 following the procedure. Post Debridement Measurements: 1.4cm length x 2cm width x 0.1cm depth; 0.22cm^3 volume. Character of Wound/Ulcer Post Debridement is improved. Post procedure Diagnosis Wound #2: Same as Pre-Procedure Plan 1. In office sharp debridement 2. Hydrofera Blue and Santyl 3. Follow-up in 1 week Electronic Signature(s) Signed: 01/13/2022 10:03:43 AM By: Kalman Shan DO Entered By: Kalman Shan on 01/13/2022 09:13:33 -------------------------------------------------------------------------------- HxROS Details Patient Name: Date of Service: [name], [place] Mooney. 01/13/2022 8:00 A M Medical Record Number: [id number] Patient Account Number: [id number] Date of Birth/Sex: Treating RN: 1960/10/04 (61 y.o. [name] Primary Care Provider: [name], [name] Other Clinician: Referring Provider: Treating  Provider/Extender: Erric Machnik [name], [name] Weeks in Treatment: 0 Constitutional Symptoms (General Health) Complaints and Symptoms: Negative for: Fatigue; Fever; Chills; Marked Weight Change Eyes Complaints and Symptoms: Positive for: Glasses / Contacts - contacts [name], [name] ([id number]) [id number] Page 8 of 9 Ear/Nose/Mouth/Throat Complaints and Symptoms: Negative for: Chronic sinus problems or rhinitis Respiratory Complaints and Symptoms: Negative for: Chronic or frequent coughs; Shortness of Breath Gastrointestinal Complaints and Symptoms: Negative for: Frequent diarrhea; Nausea; Vomiting Genitourinary Complaints and Symptoms: Negative for: Frequent urination Integumentary (Skin) Complaints and Symptoms: Positive for: Wounds - Rt llower leg Musculoskeletal Complaints and Symptoms: Negative for: Muscle Pain; Muscle Weakness Psychiatric Complaints and Symptoms: Negative for: Claustrophobia Hematologic/Lymphatic Cardiovascular Medical History: Positive for: Hypertension Past Medical History Notes: hypercholesteremia Endocrine Medical History: Past Medical History Notes: hypothyroid Immunological Neurologic Complaints and Symptoms: Review of System Notes: lower leg Oncologic Immunizations Pneumococcal Vaccine: Received Pneumococcal Vaccination: No Implantable Devices None Family and Social History Never smoker; Marital Status - Married; Alcohol Use: Moderate; Drug Use: No History; Caffeine Use: Daily; Financial Concerns: No; Food, Clothing or Shelter Needs: No; Support System Lacking: No; Transportation Concerns: No Electronic Signature(s) Signed: 01/13/2022 10:03:43 AM By: [name] ([id number]) [id number] Page 9 of 9 Signed: 01/13/2022 3:12:14 PM By: Sharyn Creamer RN, BSN Entered By: Sharyn Creamer on 01/13/2022  08:13:37 -------------------------------------------------------------------------------- SuperBill Details Patient Name: Date of Service: [name], KUTCH 01/13/2022 Medical Record Number: [id number] Patient Account Number: [id number] Date of Birth/Sex: Treating RN: [birth year] (61 y.o. M) Primary Care Provider: [name], [name] Other Clinician: Referring Provider: Treating Provider/Extender: Shevawn Langenberg [name], [name] Weeks in Treatment: 0 Diagnosis Coding ICD-10 Codes Code Description [phone] Non-pressure chronic ulcer of other part of right lower leg with fat layer exposed T79.8XXA Other early complications of trauma, initial encounter N18.30 Chronic kidney disease, stage 3 unspecified Facility Procedures :  CPT4 Code: [id number] Description: 64383 - WOUND CARE VISIT-LEV 3 EST PT Modifier: 25 Quantity: 1 : CPT4 Code: [id number] Description: 43606 - DEB SUBQ TISSUE 20 SQ CM/< ICD-10 Diagnosis Description V70.340 Non-pressure chronic ulcer of other part of right lower leg with fat layer expos Modifier: ed Quantity: 1 Physician Procedures : CPT4 Code Description Modifier [id number] 59093 - WC PHYS LEVEL 4 - NEW PT ICD-10 Diagnosis Description J12.162 Non-pressure chronic ulcer of other part of right lower leg with fat layer exposed T79.8XXA Other early complications of trauma, initial  encounter N18.30 Chronic kidney disease, stage 3 unspecified Quantity: 1 : [id number] 11042 - WC PHYS SUBQ TISS 20 SQ CM ICD-10 Diagnosis Description L97.812 Non-pressure chronic ulcer of other part of right lower leg with fat layer exposed Quantity: 1 Electronic Signature(s) Signed: 01/13/2022 11:37:20 AM By: Kalman Shan DO Signed: 01/13/2022 3:12:14 PM By: Sharyn Creamer RN, BSN Previous Signature: 01/13/2022 10:03:43 AM Version By: Kalman Shan DO Entered By: Sharyn Creamer on 01/13/2022 10:25:40

## 2022-01-16 ENCOUNTER — Encounter (HOSPITAL_BASED_OUTPATIENT_CLINIC_OR_DEPARTMENT_OTHER): Payer: Commercial Managed Care - PPO | Admitting: Internal Medicine

## 2022-01-20 ENCOUNTER — Encounter (HOSPITAL_BASED_OUTPATIENT_CLINIC_OR_DEPARTMENT_OTHER): Payer: Commercial Managed Care - PPO | Admitting: Internal Medicine

## 2022-01-20 DIAGNOSIS — L97812 Non-pressure chronic ulcer of other part of right lower leg with fat layer exposed: Secondary | ICD-10-CM | POA: Diagnosis not present

## 2022-01-20 DIAGNOSIS — I87311 Chronic venous hypertension (idiopathic) with ulcer of right lower extremity: Secondary | ICD-10-CM

## 2022-01-20 DIAGNOSIS — N183 Chronic kidney disease, stage 3 unspecified: Secondary | ICD-10-CM

## 2022-01-20 DIAGNOSIS — T798XXA Other early complications of trauma, initial encounter: Secondary | ICD-10-CM | POA: Diagnosis not present

## 2022-01-22 NOTE — Progress Notes (Signed)
TAISEAN, [name] ([id number]) [id number] Page 1 of 7 Visit Report for 01/20/2022 Chief Complaint Document Details Patient Name: Date of Service: [name], [name] 01/20/2022 9:15 A M Medical Record Number: [id number] Patient Account Number: [id number] Date of Birth/Sex: Treating RN: 02-18-1961 (61 y.o. [name], Lauren Primary Care Provider: [name], [name] Other Clinician: Referring Provider: Treating Provider/Extender: Devinn Hurwitz [name], [name] Weeks in Treatment: 1 Information Obtained from: Patient Chief Complaint 01/13/2022; wounds to the right lower extremity secondary to trauma Electronic Signature(s) Signed: 01/20/2022 11:13:24 AM By: Kalman Shan DO Entered By: Kalman Shan on 01/20/2022 10:28:21 -------------------------------------------------------------------------------- HPI Details Patient Name: Date of Service: Keith, Mooney 01/20/2022 9:15 A M Medical Record Number: [id number] Patient Account Number: [id number] Date of Birth/Sex: Treating RN: [birth year] (61 y.o. Erie Noe Primary Care Provider: [name], [name] Other Clinician: Referring Provider: Treating Provider/Extender: Darlina Mccaughey [name], [name] Weeks in Treatment: 1 History of Present Illness HPI Description: Admission 01/13/2022 Mr. tayvion pessin is a 61 year old male with a past medical history of hypertension and CKD stage III that presents to the clinic for wounds to his right lower extremity. He states he was helping loads shingles when a [employer] tractor hit his shin causing open wounds. He visited the ED for this issue on 12/11/2021 and was prescribed Bactroban ointment. The following week he followed up and was given Keflex for potential cellulitis. He has been also following with his primary care physician for this issue. After he finished Keflex he has been prescribed doxycycline and is currently taking this. He denies systemic signs  of infection currently. For the past 2 weeks he has been keeping the area open to air. He has some chronic mild pain to the wound sites. He denies increased warmth, erythema or purulent drainage. 10/17; patient presents for follow-up. He has been using Medihoney with Hydrofera Blue daily. He reports some mild chronic pain to the wound sites. Overall he reports improvement in his pain level. He denies any purulent drainage, increased warmth or erythema to the area. Electronic Signature(s) Signed: 01/20/2022 11:13:24 AM By: Kalman Shan DO Entered By: Kalman Shan on 01/20/2022 10:29:11 -------------------------------------------------------------------------------- Physical Exam Details Patient Name: Date of Service: [name] 01/20/2022 9:15 A MCDONALD, SHANKEL B ([id number]) [id number] Page 2 of 7 Medical Record Number: [id number] Patient Account Number: [id number] Date of Birth/Sex: Treating RN: October 23, 1960 (61 y.o. Erie Noe Primary Care Provider: [name], [name] Other Clinician: Referring Provider: Treating Provider/Extender: Jonathandavid Marlett [name], [name] Weeks in Treatment: 1 Constitutional respirations regular, non-labored and within target range for patient.. Cardiovascular 2+ dorsalis pedis/posterior tibialis pulses. Psychiatric pleasant and cooperative. Notes Right lower extremity: Multiple open wounds to the anterior aspect with nonviable tissue throughout. No increased warmth, erythema or purulent drainage. Electronic Signature(s) Signed: 01/20/2022 11:13:24 AM By: Kalman Shan DO Entered By: Kalman Shan on 01/20/2022 10:29:29 -------------------------------------------------------------------------------- Physician Orders Details Patient Name: Date of Service: [name], [name] 01/20/2022 9:15 A M Medical Record Number: [id number] Patient Account Number: [id number] Date of Birth/Sex: Treating RN: Aug 09, 1960  (61 y.o. Erie Noe Primary Care Provider: [name], [name] Other Clinician: Referring Provider: Treating Provider/Extender: Dnaiel Voller [name], [name] Weeks in Treatment: 1 Verbal / Phone Orders: No Diagnosis Coding Follow-up Appointments ppointment in 1 week. - Dr Heber  and Allayne Butcher - Room 9 Return A Nurse Visit: - Friday 01/23/22 @ 0930 Rm # 7 Anesthetic Wound #1 Right,Medial Lower Leg (In clinic) Topical Lidocaine 5% applied to wound bed Wound #2 Right,Distal,Medial Lower Leg (In clinic)  Topical Lidocaine 5% applied to wound bed Bathing/ Shower/ Hygiene May shower and wash wound with soap and water. - leave bandage on in shower, remove at end of shower wash wound with dial antibacterial soap or wound cleanser, redress wound after shower Edema Control - Lymphedema / SCD / Other Right Lower Extremity Elevate legs to the level of the heart or above for 30 minutes daily and/or when sitting, a frequency of: Avoid standing for long periods of time. Exercise regularly Moisturize legs daily. Wound Treatment Wound #1 - Lower Leg Wound Laterality: Right, Medial Cleanser: Wound Cleanser 1 x Per Week/15 Days Discharge Instructions: Cleanse the wound with wound cleanser prior to applying a clean dressing using gauze sponges, not tissue or cotton balls. Topical: Gentamicin 1 x Per Week/15 Days Discharge Instructions: As directed by physician Topical: Mupirocin Ointment 1 x Per Week/15 Days Discharge Instructions: Apply Mupirocin (Bactroban) as instructed Prim Dressing: Hydrofera Blue Classic Foam, 2x2 in (Generic) 1 x Per Week/15 Days [name] ([id number]) [id number] Page 3 of 7 Discharge Instructions: Moisten with saline prior to applying to wound bed Secondary Dressing: Woven Gauze Sponge, Non-Sterile 4x4 in (Generic) 1 x Per Week/15 Days Discharge Instructions: Apply over primary dressing as directed. Compression Wrap: Kerlix Roll  4.5x3.1 (in/yd) 1 x Per Week/15 Days Discharge Instructions: Apply Kerlix and Coban compression as directed. Compression Wrap: Coban Self-Adherent Wrap 4x5 (in/yd) 1 x Per Week/15 Days Discharge Instructions: Apply over Kerlix as directed. Wound #2 - Lower Leg Wound Laterality: Right, Medial, Distal Cleanser: Wound Cleanser 1 x Per Week/15 Days Discharge Instructions: Cleanse the wound with wound cleanser prior to applying a clean dressing using gauze sponges, not tissue or cotton balls. Topical: Gentamicin 1 x Per Week/15 Days Discharge Instructions: As directed by physician Topical: Mupirocin Ointment 1 x Per Week/15 Days Discharge Instructions: Apply Mupirocin (Bactroban) as instructed Prim Dressing: Hydrofera Blue Classic Foam, 2x2 in (Generic) 1 x Per Week/15 Days ary Discharge Instructions: Moisten with saline prior to applying to wound bed Secondary Dressing: Woven Gauze Sponge, Non-Sterile 4x4 in (Generic) 1 x Per Week/15 Days Discharge Instructions: Apply over primary dressing as directed. Compression Wrap: Kerlix Roll 4.5x3.1 (in/yd) 1 x Per Week/15 Days Discharge Instructions: Apply Kerlix and Coban compression as directed. Compression Wrap: Coban Self-Adherent Wrap 4x5 (in/yd) 1 x Per Week/15 Days Discharge Instructions: Apply over Kerlix as directed. Electronic Signature(s) Signed: 01/20/2022 11:13:24 AM By: Kalman Shan DO Entered By: Kalman Shan on 01/20/2022 10:29:36 -------------------------------------------------------------------------------- Problem List Details Patient Name: Date of Service: [place], [place] 01/20/2022 9:15 A M Medical Record Number: [id number] Patient Account Number: [id number] Date of Birth/Sex: Treating RN: Nov 23, 1960 (61 y.o. Erie Noe Primary Care Provider: [name], [name] Other Clinician: Referring Provider: Treating Provider/Extender: Ellyse Rotolo [name], [name] Weeks in Treatment: 1 Active  Problems ICD-10 Encounter Code Description Active Date MDM Diagnosis L97.812 Non-pressure chronic ulcer of other part of right lower leg with fat layer 01/13/2022 No Yes exposed T79.8XXA Other early complications of trauma, initial encounter 01/13/2022 No Yes N18.30 Chronic kidney disease, stage 3 unspecified 01/13/2022 No Yes I87.311 Chronic venous hypertension (idiopathic) with ulcer of right lower extremity 01/20/2022 No Yes SHR[place], STATE ([id number]) [id number] Page 4 of 7 Inactive Problems Resolved Problems Electronic Signature(s) Signed: 01/20/2022 11:13:24 AM By: Kalman Shan DO Entered By: Kalman Shan on 01/20/2022 10:35:48 -------------------------------------------------------------------------------- Progress Note Details Patient Name: Date of Service: [place], [place] 01/20/2022 9:15 A M Medical Record Number: [id number] Patient Account Number: [id number] Date of  Birth/Sex: Treating RN: 02-14-1961 (61 y.o. Erie Noe Primary Care Provider: [name], [name] Other Clinician: Referring Provider: Treating Provider/Extender: Richell Corker [name], [name] Weeks in Treatment: 1 Subjective Chief Complaint Information obtained from Patient 01/13/2022; wounds to the right lower extremity secondary to trauma History of Present Illness (HPI) Admission 01/13/2022 Mr. harley abraha is a 61 year old male with a past medical history of hypertension and CKD stage III that presents to the clinic for wounds to his right lower extremity. He states he was helping loads shingles when a [employer] tractor hit his shin causing open wounds. He visited the ED for this issue on 12/11/2021 and was prescribed Bactroban ointment. The following week he followed up and was given Keflex for potential cellulitis. He has been also following with his primary care physician for this issue. After he finished Keflex he has been prescribed doxycycline and is  currently taking this. He denies systemic signs of infection currently. For the past 2 weeks he has been keeping the area open to air. He has some chronic mild pain to the wound sites. He denies increased warmth, erythema or purulent drainage. 10/17; patient presents for follow-up. He has been using Medihoney with Hydrofera Blue daily. He reports some mild chronic pain to the wound sites. Overall he reports improvement in his pain level. He denies any purulent drainage, increased warmth or erythema to the area. Patient History Social History Never smoker, Marital Status - Married, Alcohol Use - Moderate, Drug Use - No History, Caffeine Use - Daily. Medical History Cardiovascular Patient has history of Hypertension Medical A Surgical History Notes nd Cardiovascular hypercholesteremia Endocrine hypothyroid Objective Constitutional respirations regular, non-labored and within target range for patient.. Vitals Time Taken: 9:23 AM, Temperature: 97.6 F, Pulse: 60 bpm, Respiratory Rate: 18 breaths/min, Blood Pressure: 147/83 mmHg. Cardiovascular 2+ dorsalis pedis/posterior tibialis pulses. CAIN, PANTHER ([id number]) [id number] Page 5 of 7 Psychiatric pleasant and cooperative. General Notes: Right lower extremity: Multiple open wounds to the anterior aspect with nonviable tissue throughout. No increased warmth, erythema or purulent drainage. Integumentary (Hair, Skin) Wound #1 status is Open. Original cause of wound was Trauma. The date acquired was: 12/13/2021. The wound has been in treatment 1 weeks. The wound is located on the Right,Medial Lower Leg. The wound measures 1.9cm length x 1cm width x 0.1cm depth; 1.492cm^2 area and 0.149cm^3 volume. There is Fat Layer (Subcutaneous Tissue) exposed. There is no tunneling or undermining noted. There is a medium amount of serosanguineous drainage noted. There is no granulation within the wound bed. There is a large  (67-100%) amount of necrotic tissue within the wound bed including Eschar and Adherent Slough. The periwound skin appearance had no abnormalities noted for texture. The periwound skin appearance had no abnormalities noted for moisture. The periwound skin appearance exhibited: Erythema. The surrounding wound skin color is noted with erythema. Periwound temperature was noted as No Abnormality. Wound #2 status is Open. Original cause of wound was Trauma. The date acquired was: 12/13/2021. The wound has been in treatment 1 weeks. The wound is located on the Right,Distal,Medial Lower Leg. The wound measures 1.2cm length x 2.3cm width x 0.2cm depth; 2.168cm^2 area and 0.434cm^3 volume. There is Fat Layer (Subcutaneous Tissue) exposed. There is a medium amount of serosanguineous drainage noted. There is no granulation within the wound bed. There is a large (67-100%) amount of necrotic tissue within the wound bed including Eschar and Adherent Slough. The periwound skin appearance had no abnormalities noted for texture. The periwound skin appearance  did not exhibit: Dry/Scaly, Maceration, Atrophie Blanche, Cyanosis, Ecchymosis, Hemosiderin Staining, Mottled, Pallor, Rubor, Erythema. Periwound temperature was noted as No Abnormality. Assessment Active Problems ICD-10 Non-pressure chronic ulcer of other part of right lower leg with fat layer exposed Other early complications of trauma, initial encounter Chronic kidney disease, stage 3 unspecified Patient's wound has improved in appearance since last clinic visit. No signs of surrounding infection. I attempted debridement however he did not tolerate this. He has strong pedal pulses and should have adequate blood flow for wound healing. I think he has significant bioburden and I recommended topical gentamicin with mupirocin to the wound beds along with Hydrofera Blue. He has evidence of venous insufficiency and would benefit from a compression wrap. We will start  with Kerlix/Coban. We will have him back for nurse visit in 3 days to assure that he tolerated this well. Plan Follow-up Appointments: Return Appointment in 1 week. - Dr Heber [place] and Allayne Butcher - Room 9 Nurse Visit: - Friday 01/23/22 @ 0930 Rm # 7 Anesthetic: Wound #1 Right,Medial Lower Leg: (In clinic) Topical Lidocaine 5% applied to wound bed Wound #2 Right,Distal,Medial Lower Leg: (In clinic) Topical Lidocaine 5% applied to wound bed Bathing/ Shower/ Hygiene: May shower and wash wound with soap and water. - leave bandage on in shower, remove at end of shower wash wound with dial antibacterial soap or wound cleanser, redress wound after shower Edema Control - Lymphedema / SCD / Other: Elevate legs to the level of the heart or above for 30 minutes daily and/or when sitting, a frequency of: Avoid standing for long periods of time. Exercise regularly Moisturize legs daily. WOUND #1: - Lower Leg Wound Laterality: Right, Medial Cleanser: Wound Cleanser 1 x Per Week/15 Days Discharge Instructions: Cleanse the wound with wound cleanser prior to applying a clean dressing using gauze sponges, not tissue or cotton balls. Topical: Gentamicin 1 x Per Week/15 Days Discharge Instructions: As directed by physician Topical: Mupirocin Ointment 1 x Per Week/15 Days Discharge Instructions: Apply Mupirocin (Bactroban) as instructed Prim Dressing: Hydrofera Blue Classic Foam, 2x2 in (Generic) 1 x Per Week/15 Days ary Discharge Instructions: Moisten with saline prior to applying to wound bed Secondary Dressing: Woven Gauze Sponge, Non-Sterile 4x4 in (Generic) 1 x Per Week/15 Days Discharge Instructions: Apply over primary dressing as directed. Com pression Wrap: Kerlix Roll 4.5x3.1 (in/yd) 1 x Per Week/15 Days Discharge Instructions: Apply Kerlix and Coban compression as directed. Com pression Wrap: Coban Self-Adherent Wrap 4x5 (in/yd) 1 x Per Week/15 Days Discharge Instructions: Apply over Kerlix as  directed. WOUND #2: - Lower Leg Wound Laterality: Right, Medial, Distal Cleanser: Wound Cleanser 1 x Per Week/15 Days Discharge Instructions: Cleanse the wound with wound cleanser prior to applying a clean dressing using gauze sponges, not tissue or cotton balls. Topical: Gentamicin 1 x Per Week/15 Days Discharge Instructions: As directed by physician Topical: Mupirocin Ointment 1 x Per Week/15 Days Discharge Instructions: Apply Mupirocin (Bactroban) as instructed Prim Dressing: Hydrofera Blue Classic Foam, 2x2 in (Generic) 1 x Per Week/15 Days ary Discharge Instructions: Moisten with saline prior to applying to wound bed Secondary Dressing: Woven Gauze Sponge, Non-Sterile 4x4 in (Generic) 1 x Per Week/15 Days Discharge Instructions: Apply over primary dressing as directed. BARLOW, Mooney ([id number]) [id number] Page 6 of 7 Compression Wrap: Kerlix Roll 4.5x3.1 (in/yd) 1 x Per Week/15 Days Discharge Instructions: Apply Kerlix and Coban compression as directed. Compression Wrap: Coban Self-Adherent Wrap 4x5 (in/yd) 1 x Per Week/15 Days Discharge Instructions: Apply over Kerlix as  directed. 1. Hydrofera Blue 2. Gentamicin/mupirocin ointment 3. Kerlix/Coban 4. Follow-up at the end of the week for nurse visit and following week for physician visit Electronic Signature(s) Signed: 01/20/2022 11:13:24 AM By: Kalman Shan DO Entered By: Kalman Shan on 01/20/2022 10:31:39 -------------------------------------------------------------------------------- HxROS Details Patient Name: Date of Service: [place], [place] 01/20/2022 9:15 A M Medical Record Number: [id number] Patient Account Number: [id number] Date of Birth/Sex: Treating RN: 1960-10-26 (61 y.o. [name], Lauren Primary Care Provider: [name], [name] Other Clinician: Referring Provider: Treating Provider/Extender: Kloey Cazarez [name], [name] Weeks in Treatment: 1 Cardiovascular Medical  History: Positive for: Hypertension Past Medical History Notes: hypercholesteremia Endocrine Medical History: Past Medical History Notes: hypothyroid Immunizations Pneumococcal Vaccine: Received Pneumococcal Vaccination: No Implantable Devices None Family and Social History Never smoker; Marital Status - Married; Alcohol Use: Moderate; Drug Use: No History; Caffeine Use: Daily; Financial Concerns: No; Food, Clothing or Shelter Needs: No; Support System Lacking: No; Transportation Concerns: No Electronic Signature(s) Signed: 01/20/2022 11:13:24 AM By: Kalman Shan DO Signed: 01/22/2022 4:05:37 PM By: Rhae Hammock RN Entered By: Kalman Shan on 01/20/2022 10:29:16 -------------------------------------------------------------------------------- SuperBill Details Patient Name: Date of Service: [name] 01/20/2022 Medical Record Number: [id number] Patient Account Number: [id number] JODEY, GESKE ([id number]) [id number] Page 7 of 7 Date of Birth/Sex: Treating RN: June 27, 1960 (61 y.o. Erie Noe Primary Care Provider: [name], [name] Other Clinician: Referring Provider: Treating Provider/Extender: Ovadia Lopp [name], [name] Weeks in Treatment: 1 Diagnosis Coding ICD-10 Codes Code Description [phone] Non-pressure chronic ulcer of other part of right lower leg with fat layer exposed T79.8XXA Other early complications of trauma, initial encounter N18.30 Chronic kidney disease, stage 3 unspecified I87.311 Chronic venous hypertension (idiopathic) with ulcer of right lower extremity Facility Procedures : CPT4 Code: [id number] Description: 99214 - WOUND CARE VISIT-LEV 4 EST PT Modifier: Quantity: 1 Physician Procedures : CPT4 Code Description Modifier [id number] - WC PHYS LEVEL 3 - EST PT ICD-10 Diagnosis Description [id number] Non-pressure chronic ulcer of other part of right lower leg with fat layer exposed T79.8XXA  Other early complications of trauma, initial  encounter N18.30 Chronic kidney disease, stage 3 unspecified I87.311 Chronic venous hypertension (idiopathic) with ulcer of right lower extremity Quantity: 1 Electronic Signature(s) Signed: 01/20/2022 11:13:24 AM By: Kalman Shan DO Entered By: Kalman Shan on 01/20/2022 10:36:23

## 2022-01-22 NOTE — Progress Notes (Signed)
[name] ([id number]) [id number] Page 1 of 11 Visit Report for 01/20/2022 Arrival Information Details Patient Name: Date of Service: [name] 01/20/2022 9:15 A M Medical Record Number: [id number] Patient Account Number: [id number] Date of Birth/Sex: Treating RN: 1961/01/20 (61 y.o. [name], [name] Primary Care [name]: [name] Other Clinician: Referring [name]: Treating [name]/Extender: [name], [name], Mooney [name]: 1 Visit Information History Since Last Visit Added or deleted any medications: No Patient Arrived: Ambulatory Any new allergies or adverse reactions: No Arrival Time: 09:22 Had a fall or experienced change in No Accompanied By: self activities of daily living that may affect Transfer Assistance: None risk of falls: Patient Identification Verified: Yes Signs or symptoms of abuse/neglect since last visito No Secondary Verification Process Completed: Yes Hospitalized since last visit: No Implantable device outside of the clinic excluding No cellular tissue based products placed in the center since last visit: Has Dressing in Place as Prescribed: Yes Pain Present Now: No Electronic Signature(s) Signed: 01/20/2022 4:15:00 PM By: Erenest Blank Entered By: Erenest Blank on 01/20/2022 09:23:23 -------------------------------------------------------------------------------- Clinic Level of Care Assessment Details Patient Name: Date of Service: [name] 01/20/2022 9:15 A M Medical Record Number: [id number] Patient Account Number: [id number] Date of Birth/Sex: Treating RN: 05/17/1960 (61 y.o. [name], [name] Primary Care [name]: [name] Other Clinician: Referring [name]: Treating [name]/Extender: [name], [name], Mooney [name]: 1 Clinic Level of Care Assessment Items TOOL 4 Quantity Score X- 1 0 Use when only an EandM is performed on  FOLLOW-UP visit ASSESSMENTS - Nursing Assessment / Reassessment X- 1 10 Reassessment of Co-morbidities (includes updates in patient status) X- 1 5 Reassessment of Adherence to Mooney Plan ASSESSMENTS - Wound and Skin A ssessment / Reassessment []  - 0 Simple Wound Assessment / Reassessment - one wound X- 2 5 Complex Wound Assessment / Reassessment - multiple wounds []  - 0 Dermatologic / Skin Assessment (not related to wound area) ASSESSMENTS - Focused Assessment X- 1 5 Circumferential Edema Measurements - multi extremities []  - 0 Nutritional Assessment / Counseling / Intervention [name] ([id number]) [id number] Page 2 of 11 []  - 0 Lower Extremity Assessment (monofilament, tuning fork, pulses) []  - 0 Peripheral Arterial Disease Assessment (using hand held doppler) ASSESSMENTS - Ostomy and/or Continence Assessment and Care []  - 0 Incontinence Assessment and Management []  - 0 Ostomy Care Assessment and Management (repouching, etc.) PROCESS - Coordination of Care []  - 0 Simple Patient / Family Education for ongoing care X- 1 20 Complex (extensive) Patient / Family Education for ongoing care X- 1 10 Staff obtains Programmer, systems, Records, T Results / Process Orders est []  - 0 Staff telephones HHA, Nursing Homes / Clarify orders / etc []  - 0 Routine Transfer to another Facility (non-emergent condition) []  - 0 Routine Hospital Admission (non-emergent condition) []  - 0 New Admissions / Biomedical engineer / Ordering NPWT Apligraf, etc. , []  - 0 Emergency Hospital Admission (emergent condition) X- 1 10 Simple Discharge Coordination []  - 0 Complex (extensive) Discharge Coordination PROCESS - Special Needs []  - 0 Pediatric / Minor Patient Management []  - 0 Isolation Patient Management []  - 0 Hearing / Language / Visual special needs []  - 0 Assessment of Community assistance (transportation, D/C planning, etc.) []  - 0 Additional  assistance / Altered mentation []  - 0 Support Surface(s) Assessment (bed, cushion, seat, etc.) INTERVENTIONS - Wound Cleansing / Measurement []  - 0 Simple Wound Cleansing - one wound X- 2 5 Complex Wound Cleansing -  multiple wounds X- 1 5 Wound Imaging (photographs - any number of wounds) []  - 0 Wound Tracing (instead of photographs) []  - 0 Simple Wound Measurement - one wound X- 2 5 Complex Wound Measurement - multiple wounds INTERVENTIONS - Wound Dressings []  - 0 Small Wound Dressing one or multiple wounds X- 2 15 Medium Wound Dressing one or multiple wounds []  - 0 Large Wound Dressing one or multiple wounds X- 1 5 Application of Medications - topical []  - 0 Application of Medications - injection INTERVENTIONS - Miscellaneous []  - 0 External ear exam []  - 0 Specimen Collection (cultures, biopsies, blood, body fluids, etc.) []  - 0 Specimen(s) / Culture(s) sent or taken to Lab for analysis []  - 0 Patient Transfer (multiple staff / / Similar devices) []  - 0 Simple Staple / Suture removal (25 or less) []  - 0 Complex Staple / Suture removal (26 or more) []  - 0 Hypo / Hyperglycemic Management (close monitor of Blood Glucose) [name] ( ) [id number] Page 3 of 11 []  - 0 Ankle / Brachial Index (ABI) - do not check if billed separately X- 1 5 Vital Signs Has the patient been seen at the hospital within the last three years: Yes Total Score: 135 Level Of Care: New/Established - Level 4 Electronic Signature(s) Signed: 01/22/2022 4:05:37 PM By: RN Entered By: on 01/20/2022 10:12:24 -------------------------------------------------------------------------------- Encounter Discharge Information Details Patient Name: Date of Service: Mooney. 01/20/2022 9:15 A M Medical Record Number: Nurse, adult Patient Account Number: Date of Birth/Sex: Treating RN: Jun 27, 1960  (61 y.o. [id number], [name] Primary Care Teauna Mooney: 10-07-1986, Mooney Other Clinician: Referring Kamilla Hands: Treating Jai Steil/Extender: [name] , Mooney [name]: 1 Encounter Discharge Information Items Discharge Condition: Stable Ambulatory Status: Ambulatory Discharge Destination: Home Transportation: Private Auto Accompanied By: self Schedule Follow-up Appointment: Yes Clinical Summary of Care: Patient Declined Electronic Signature(s) Signed: 01/22/2022 4:05:37 PM By: Fonnie Mu RN Entered By: Fonnie Mu on 01/20/2022 10:14:18 -------------------------------------------------------------------------------- Lower Extremity Assessment Details Patient Name: Date of Service: Kimball, 01/22/2022 Mooney. 01/20/2022 9:15 A M Medical Record Number: [id number] Patient Account Number: 03/05/1961 Date of Birth/Sex: Treating RN: [birth year] (61 y.o. [place], [name] Primary Care Kamonte Mcmichen: [place], Mooney Other Clinician: Referring Adien Kimmel: Treating Shanika Levings/Extender: [name] 01/24/2022, Mooney [name]: 1 Edema Assessment Assessed: [Left: No] [Right: No] [Left: Edema] [Right: :] Calf Left: Right: Point of Measurement: From Medial Instep 39 cm Ankle Left: Right: Point of Measurement: From Medial Instep 23.6 cm Electronic Signature(s) Signed: 01/20/2022 4:15:00 PM By: Fonnie Mu (01/22/2022) [id number] Page 4 of 11 Signed: 01/22/2022 4:05:37 PM By: 01/22/2022 RN Entered By: [id number] on 01/20/2022 09:34:10 -------------------------------------------------------------------------------- Multi Wound Chart Details Patient Name: Date of Service: 03/05/1961 Mooney. 01/20/2022 9:15 A M Medical Record Number: [name] Patient Account Number: [place] Date of Birth/Sex: Treating RN: [birth year] (61 y.o. Alvina Filbert, [name] Primary Care Keeton Kassebaum: [id number], Mooney Other  Clinician: Referring Lillyona Polasek: Treating Torrey Horseman/Extender: [name] 10-07-1986, Mooney [name]: 1 Vital Signs Height(in): Pulse(bpm): 60 Weight(lbs): Blood Pressure(mmHg): 147/83 Body Mass Index(BMI): Temperature(F): 97.6 Respiratory Rate(breaths/min): 18 [1:Photos:] [N/A:N/A] Right, Medial Lower Leg Right, Distal, Medial Lower Leg N/A Wound Location: Trauma Trauma N/A Wounding Event: Trauma, Other Trauma, Other N/A Primary Etiology: Hypertension Hypertension N/A Comorbid History: 12/13/2021 12/13/2021 N/A Date Acquired: 1 1 N/A Keith of Mooney: Open Open N/A Wound Status: No No N/A Wound Recurrence: No Yes N/A Clustered Wound: 1.9x1x0.1 1.2x2.3x0.2 N/A Measurements L  x W x D (cm) 1.492 2.168 N/A A (cm) : rea 0.149 0.434 N/A Volume (cm) : -111.00% 1.40% N/A % Reduction in A rea: -109.90% -97.30% N/A % Reduction in Volume: Full Thickness Without Exposed Full Thickness Without Exposed N/A Classification: Support Structures Support Structures Medium Medium N/A Exudate A mount: Serosanguineous Serosanguineous N/A Exudate Type: red, brown red, brown N/A Exudate Color: None Present (0%) None Present (0%) N/A Granulation Amount: Large (67-100%) Large (67-100%) N/A Necrotic Amount: Eschar, Adherent Slough Eschar, Adherent Slough N/A Necrotic Tissue: Fat Layer (Subcutaneous Tissue): Yes Fat Layer (Subcutaneous Tissue): Yes N/A Exposed Structures: Fascia: No Fascia: No Tendon: No Tendon: No Muscle: No Muscle: No Joint: No Joint: No Bone: No Bone: No None None N/A Epithelialization: No Abnormalities Noted Excoriation: No N/A Periwound Skin Texture: Induration: No Callus: No Crepitus: No Rash: No Scarring: No No Abnormalities Noted Maceration: No N/A Periwound Skin Moisture: Dry/Scaly: No Erythema: Yes Atrophie Blanche: No N/A Periwound Skin Color: Cyanosis: No Ecchymosis: No Erythema: No Hemosiderin Staining: No [name], [name] ([id number]) [id number] Page 5 of 11 Mottled: No Pallor: No Rubor: No No Abnormality No Abnormality N/A Temperature: Mooney Notes Wound #1 (Lower Leg) Wound Laterality: Right, Medial Cleanser Wound Cleanser Discharge Instruction: Cleanse the wound with wound cleanser prior to applying a clean dressing using gauze sponges, not tissue or cotton balls. Peri-Wound Care Topical Gentamicin Discharge Instruction: As directed by physician Mupirocin Ointment Discharge Instruction: Apply Mupirocin (Bactroban) as instructed Primary Dressing Hydrofera Blue Classic Foam, 2x2 in Discharge Instruction: Moisten with saline prior to applying to wound bed Secondary Dressing Woven Gauze Sponge, Non-Sterile 4x4 in Discharge Instruction: Apply over primary dressing as directed. Secured With Compression Wrap Kerlix Roll 4.5x3.1 (in/yd) Discharge Instruction: Apply Kerlix and Coban compression as directed. Coban Self-Adherent Wrap 4x5 (in/yd) Discharge Instruction: Apply over Kerlix as directed. Compression Stockings Add-Ons Wound #2 (Lower Leg) Wound Laterality: Right, Medial, Distal Cleanser Wound Cleanser Discharge Instruction: Cleanse the wound with wound cleanser prior to applying a clean dressing using gauze sponges, not tissue or cotton balls. Peri-Wound Care Topical Gentamicin Discharge Instruction: As directed by physician Mupirocin Ointment Discharge Instruction: Apply Mupirocin (Bactroban) as instructed Primary Dressing Hydrofera Blue Classic Foam, 2x2 in Discharge Instruction: Moisten with saline prior to applying to wound bed Secondary Dressing Woven Gauze Sponge, Non-Sterile 4x4 in Discharge Instruction: Apply over primary dressing as directed. Secured With Compression Wrap Kerlix Roll 4.5x3.1 (in/yd) Discharge Instruction: Apply Kerlix and Coban compression as directed. Coban Self-Adherent Wrap 4x5 (in/yd) Discharge Instruction: Apply  over Kerlix as directed. Compression Stockings Add-Ons [name], [name] ([id number]) [id number] Page 6 of 11 Electronic Signature(s) Signed: 01/20/2022 11:13:24 AM By: Geralyn Corwin DO Signed: 01/22/2022 4:05:37 PM By: Fonnie Mu RN Entered By: Geralyn Corwin on 01/20/2022 10:28:15 -------------------------------------------------------------------------------- Multi-Disciplinary Care Plan Details Patient Name: Date of Service: [name], [name] 01/20/2022 9:15 A M Medical Record Number: [id number] Patient Account Number: [id number] Date of Birth/Sex: Treating RN: October 09, 1960 (61 y.o. [name], [name] Primary Care Sharline Lehane: [place], Mooney Other Clinician: Referring Crestina Strike: Treating Tiffiney Sparrow/Extender: [name] [place], Mooney [name]: 1 Active Inactive Wound/Skin Impairment Nursing Diagnoses: Impaired tissue integrity Knowledge deficit related to ulceration/compromised skin integrity Goals: Patient/caregiver will verbalize understanding of skin care regimen Date Initiated: 01/13/2022 Target Resolution Date: 02/10/2022 Goal Status: Active Ulcer/skin breakdown will have a volume reduction of 30% by week 4 Date Initiated: 01/13/2022 Target Resolution Date: 02/10/2022 Goal Status: Active Interventions: Assess patient/caregiver ability to obtain necessary supplies Assess patient/caregiver ability to perform ulcer/skin  care regimen upon admission and as needed Assess ulceration(s) every visit Provide education on ulcer and skin care Notes: Electronic Signature(s) Signed: 01/22/2022 4:05:37 PM By: Fonnie MuBreedlove, Lauren RN Entered By: Fonnie MuBreedlove, [name] on 01/20/2022 09:36:59 -------------------------------------------------------------------------------- Pain Assessment Details Patient Name: Date of Service: [name], Keith LTER Mooney. 01/20/2022 9:15 A M Medical Record Number: [id number] Patient Account Number: [id number] Date of  Birth/Sex: Treating RN: November 26, 1960 (61 y.o. Lucious GrovesM) Breedlove, [name] Primary Care Yandel Zeiner: [place], Mooney Other Clinician: Referring Rilynn Habel: Treating Amillia Biffle/Extender: [name] [place], Mooney [name]: 1 Active Problems Location of Pain Severity and Description of Pain Patient Has Everitt Amberaino Yes Westenberger, Alphonse Mooney ([id number]) [id number] Page 7 of 11 Patient Has Paino Yes Site Locations Pain Location: Pain in Ulcers Rate the pain. Current Pain Level: 1 Character of Pain Describe the Pain: Other: stinging Pain Management and Medication Current Pain Management: Electronic Signature(s) Signed: 01/20/2022 4:15:00 PM By: Thayer Dallasick, Kimberly Signed: 01/22/2022 4:05:37 PM By: Fonnie MuBreedlove, Lauren RN Entered By: Thayer Dallasick, Kimberly on 01/20/2022 09:23:44 -------------------------------------------------------------------------------- Patient/Caregiver Education Details Patient Name: Date of Service: Keith FirstEGREE, Keith LTER Mooney. 10/17/2023andnbsp9:15 A M Medical Record Number: [id number] Patient Account Number: [id number] Date of Birth/Gender: Treating RN: November 26, 1960 (61 y.o. Lucious GrovesM) Breedlove, [name] Primary Care Physician: [place], Mooney Other Clinician: Referring Physician: Treating Physician/Extender: [name] [place], Mooney [name]: 1 Education Assessment Education Provided To: Patient Education Topics Provided Wound/Skin Impairment: Methods: Explain/Verbal Responses: Reinforcements needed, State content correctly Electronic Signature(s) Signed: 01/22/2022 4:05:37 PM By: Fonnie MuBreedlove, Lauren RN Entered By: Fonnie MuBreedlove, [name] on 01/20/2022 09:37:08 Halteman, Keith BureauWALTER Mooney ([id number]) [id number] Page 8 of 11 -------------------------------------------------------------------------------- Wound Assessment Details Patient Name: Date of Service: [name], Keith LTER Mooney. 01/20/2022 9:15 A M Medical Record Number: [id number] Patient Account  Number: [id number] Date of Birth/Sex: Treating RN: November 26, 1960 (61 y.o. Charlean MerlM) Breedlove, [name] Primary Care Camaron Cammack: [place], Mooney Other Clinician: Referring Kaja Jackowski: Treating Nicholaos Schippers/Extender: [name] [place], Mooney [name]: 1 Wound Status Wound Number: 1 Primary Etiology: Trauma, Other Wound Location: Right, Medial Lower Leg Wound Status: Open Wounding Event: Trauma Comorbid History: Hypertension Date Acquired: 12/13/2021 Keith Of Mooney: 1 Clustered Wound: No Photos Wound Measurements Length: (cm) 1.9 Width: (cm) 1 Depth: (cm) 0.1 Area: (cm) 1.492 Volume: (cm) 0.149 % Reduction in Area: -111% % Reduction in Volume: -109.9% Epithelialization: None Tunneling: No Undermining: No Wound Description Classification: Full Thickness Without Exposed Support Structures Exudate Amount: Medium Exudate Type: Serosanguineous Exudate Color: red, brown Foul Odor After Cleansing: No Slough/Fibrino Yes Wound Bed Granulation Amount: None Present (0%) Exposed Structure Necrotic Amount: Large (67-100%) Fascia Exposed: No Necrotic Quality: Eschar, Adherent Slough Fat Layer (Subcutaneous Tissue) Exposed: Yes Tendon Exposed: No Muscle Exposed: No Joint Exposed: No Bone Exposed: No Periwound Skin Texture Texture Color No Abnormalities Noted: Yes No Abnormalities Noted: No Erythema: Yes Moisture No Abnormalities Noted: Yes Temperature / Pain Temperature: No Abnormality Mooney Notes Wound #1 (Lower Leg) Wound Laterality: Right, Medial Cleanser Wound Cleanser Discharge Instruction: Cleanse the wound with wound cleanser prior to applying a clean dressing using gauze sponges, not tissue or cotton balls. Peri-Wound Care Topical Gentamicin Discharge Instruction: As directed by physician Bernette RedbirdDEGREE, [name] ([id number]) [id number] Page 9 of 11 Mupirocin Ointment Discharge Instruction: Apply Mupirocin (Bactroban) as instructed Primary  Dressing Hydrofera Blue Classic Foam, 2x2 in Discharge Instruction: Moisten with saline prior to applying to wound bed Secondary Dressing Woven Gauze Sponge, Non-Sterile 4x4 in Discharge Instruction: Apply over primary dressing as directed. Secured With Compression Wrap Kerlix Roll 4.5x3.1 (in/yd) Discharge Instruction: Apply Kerlix and Coban compression  as directed. Coban Self-Adherent Wrap 4x5 (in/yd) Discharge Instruction: Apply over Kerlix as directed. Compression Stockings Add-Ons Electronic Signature(s) Signed: 01/20/2022 4:15:00 PM By: Thayer Dallas Signed: 01/22/2022 4:05:37 PM By: Fonnie Mu RN Entered By: Thayer Dallas on 01/20/2022 09:39:32 -------------------------------------------------------------------------------- Wound Assessment Details Patient Name: Date of Service: [name], [name] 01/20/2022 9:15 A M Medical Record Number: [id number] Patient Account Number: [id number] Date of Birth/Sex: Treating RN: 12-02-1960 (61 y.o. [name], [name] Primary Care Taeler Winning: [place], Mooney Other Clinician: Referring Cassady Turano: Treating Lilliahna Schubring/Extender: [name] [place], Mooney [name]: 1 Wound Status Wound Number: 2 Primary Etiology: Trauma, Other Wound Location: Right, Distal, Medial Lower Leg Wound Status: Open Wounding Event: Trauma Comorbid History: Hypertension Date Acquired: 12/13/2021 Keith Of Mooney: 1 Clustered Wound: Yes Photos Wound Measurements Length: (cm) 1.2 Width: (cm) 2.3 Depth: (cm) 0.2 Area: (cm) 2.168 Volume: (cm) 0.434 [name], [name] ([id number]) Wound Description Classification: Full Thickness Without Exposed Support Structures Exudate Amount: Medium Exudate Type: Serosanguineous Exudate Color: red, brown Foul Odor After Cleansing: No Slough/Fibrino Yes % Reduction in Area: 1.4% % Reduction in Volume: -97.3% Epithelialization: None [id number] Page 10 of 11 Wound  Bed Granulation Amount: None Present (0%) Exposed Structure Necrotic Amount: Large (67-100%) Fascia Exposed: No Necrotic Quality: Eschar, Adherent Slough Fat Layer (Subcutaneous Tissue) Exposed: Yes Tendon Exposed: No Muscle Exposed: No Joint Exposed: No Bone Exposed: No Periwound Skin Texture Texture Color No Abnormalities Noted: Yes No Abnormalities Noted: No Atrophie Blanche: No Moisture Cyanosis: No No Abnormalities Noted: No Ecchymosis: No Dry / Scaly: No Erythema: No Maceration: No Hemosiderin Staining: No Mottled: No Pallor: No Rubor: No Temperature / Pain Temperature: No Abnormality Mooney Notes Wound #2 (Lower Leg) Wound Laterality: Right, Medial, Distal Cleanser Wound Cleanser Discharge Instruction: Cleanse the wound with wound cleanser prior to applying a clean dressing using gauze sponges, not tissue or cotton balls. Peri-Wound Care Topical Gentamicin Discharge Instruction: As directed by physician Mupirocin Ointment Discharge Instruction: Apply Mupirocin (Bactroban) as instructed Primary Dressing Hydrofera Blue Classic Foam, 2x2 in Discharge Instruction: Moisten with saline prior to applying to wound bed Secondary Dressing Woven Gauze Sponge, Non-Sterile 4x4 in Discharge Instruction: Apply over primary dressing as directed. Secured With Compression Wrap Kerlix Roll 4.5x3.1 (in/yd) Discharge Instruction: Apply Kerlix and Coban compression as directed. Coban Self-Adherent Wrap 4x5 (in/yd) Discharge Instruction: Apply over Kerlix as directed. Compression Stockings Add-Ons Electronic Signature(s) Signed: 01/20/2022 4:15:00 PM By: Thayer Dallas Signed: 01/22/2022 4:05:37 PM By: Fonnie Mu RN Entered By: Thayer Dallas on 01/20/2022 09:40:50 [name], [name] ([id number]) [id number] Page 11 of 11 -------------------------------------------------------------------------------- Vitals Details Patient Name: Date of  Service: [name], [name] 01/20/2022 9:15 A M Medical Record Number: [id number] Patient Account Number: [id number] Date of Birth/Sex: Treating RN: Aug 11, 1960 (61 y.o. Lucious Groves Primary Care Verlaine Embry: [place], Mooney Other Clinician: Referring Axle Parfait: Treating Makhari Dovidio/Extender: [name] [place], Mooney [name]: 1 Vital Signs Time Taken: 09:23 Temperature (F): 97.6 Pulse (bpm): 60 Respiratory Rate (breaths/min): 18 Blood Pressure (mmHg): 147/83 Reference Range: 80 - 120 mg / dl Electronic Signature(s) Signed: 01/20/2022 4:15:00 PM By: Thayer Dallas Entered By: Thayer Dallas on 01/20/2022 09:25:14

## 2022-01-23 ENCOUNTER — Encounter (HOSPITAL_BASED_OUTPATIENT_CLINIC_OR_DEPARTMENT_OTHER): Payer: Commercial Managed Care - PPO | Admitting: Internal Medicine

## 2022-01-23 DIAGNOSIS — I87311 Chronic venous hypertension (idiopathic) with ulcer of right lower extremity: Secondary | ICD-10-CM | POA: Diagnosis not present

## 2022-01-27 ENCOUNTER — Encounter (HOSPITAL_BASED_OUTPATIENT_CLINIC_OR_DEPARTMENT_OTHER): Payer: Commercial Managed Care - PPO | Admitting: General Surgery

## 2022-01-27 DIAGNOSIS — I87311 Chronic venous hypertension (idiopathic) with ulcer of right lower extremity: Secondary | ICD-10-CM | POA: Diagnosis not present

## 2022-01-27 NOTE — Progress Notes (Signed)
[name], HAYE ([id number]) [id number] Page 1 of 1 Visit Report for 01/27/2022 SuperBill Details Patient Name: Date of Service: [name], [name] 01/27/2022 Medical Record Number: [id number] Patient Account Number: [id number] Date of Birth/Sex: Treating RN: January 31, 1961 (61 y.o. Collene Gobble Primary Care Provider: [name], [name] Other Clinician: Referring Provider: Treating Provider/Extender: Samhitha Rosen [name], [name] Weeks in Treatment: 2 Diagnosis Coding ICD-10 Codes Code Description [phone] Non-pressure chronic ulcer of other part of right lower leg with fat layer exposed T79.8XXA Other early complications of trauma, initial encounter N18.30 Chronic kidney disease, stage 3 unspecified I87.311 Chronic venous hypertension (idiopathic) with ulcer of right lower extremity Facility Procedures CPT4 Code Description Modifier Quantity [id number] - WOUND CARE VISIT-LEV 4 EST PT 1 Electronic Signature(s) Signed: 01/27/2022 11:09:58 AM By: Fredirick Maudlin MD FACS Signed: 01/27/2022 5:28:37 PM By: Dellie Catholic RN Entered By: Dellie Catholic on 01/27/2022 10:03:12

## 2022-01-27 NOTE — Progress Notes (Signed)
[name] ([id number]) [id number] Page 1 of 7 Visit Report for 01/27/2022 Arrival Information Details Patient Name: Date of Service: [name], [name] 01/27/2022 9:15 A M Medical Record Number: [id number] Patient Account Number: [id number] Date of Birth/Sex: Treating RN: 06/05/1960 (61 y.o. [name] Primary Care Yida Hyams: [name], [name] Other Clinician: Referring Hunter Bachar: Treating Trajon Rosete/Extender: Cannon, Jennifer [name], [name] Weeks in Treatment: 2 Visit Information History Since Last Visit Added or deleted any medications: No Patient Arrived: Ambulatory Any new allergies or adverse reactions: No Arrival Time: 09:37 Had a fall or experienced change in No Accompanied By: self activities of daily living that may affect Transfer Assistance: None risk of falls: Patient Identification Verified: Yes Signs or symptoms of abuse/neglect since last visito No Patient Requires Transmission-Based Precautions: No Hospitalized since last visit: No Patient Has Alerts: No Implantable device outside of the clinic excluding No cellular tissue based products placed in the center since last visit: Has Dressing in Place as Prescribed: Yes Pain Present Now: No Electronic Signature(s) Signed: 01/27/2022 5:28:37 PM By: Dellie Catholic RN Entered By: Dellie Catholic on 01/27/2022 09:38:23 -------------------------------------------------------------------------------- Clinic Level of Care Assessment Details Patient Name: Date of Service: [name] 01/27/2022 9:15 A M Medical Record Number: [id number] Patient Account Number: [id number] Date of Birth/Sex: Treating RN: 05-28-1960 (61 y.o. [name] Primary Care Vernette Moise: [name], [name] Other Clinician: Referring Oval Moralez: Treating Emnet Monk/Extender: Cannon, Jennifer [name], [name] Weeks in Treatment: 2 Clinic Level of Care Assessment Items TOOL 4 Quantity Score X- 1 0 Use when only an  EandM is performed on FOLLOW-UP visit ASSESSMENTS - Nursing Assessment / Reassessment X- 1 10 Reassessment of Co-morbidities (includes updates in patient status) X- 1 5 Reassessment of Adherence to Treatment Plan ASSESSMENTS - Wound and Skin A ssessment / Reassessment []  - 0 Simple Wound Assessment / Reassessment - one wound X- 2 5 Complex Wound Assessment / Reassessment - multiple wounds []  - 0 Dermatologic / Skin Assessment (not related to wound area) ASSESSMENTS - Focused Assessment []  - 0 Circumferential Edema Measurements - multi extremities []  - 0 Nutritional Assessment / Counseling / Intervention KUSHAL, SAUNDERS ([id number]) [id number] Page 2 of 7 []  - 0 Lower Extremity Assessment (monofilament, tuning fork, pulses) []  - 0 Peripheral Arterial Disease Assessment (using hand held doppler) ASSESSMENTS - Ostomy and/or Continence Assessment and Care []  - 0 Incontinence Assessment and Management []  - 0 Ostomy Care Assessment and Management (repouching, etc.) PROCESS - Coordination of Care X - Simple Patient / Family Education for ongoing care 1 15 []  - 0 Complex (extensive) Patient / Family Education for ongoing care X- 1 10 Staff obtains Programmer, systems, Records, T Results / Process Orders est X- 1 10 Staff telephones HHA, Nursing Homes / Clarify orders / etc []  - 0 Routine Transfer to another Facility (non-emergent condition) []  - 0 Routine Hospital Admission (non-emergent condition) []  - 0 New Admissions / Biomedical engineer / Ordering NPWT Apligraf, etc. , []  - 0 Emergency Hospital Admission (emergent condition) X- 1 10 Simple Discharge Coordination []  - 0 Complex (extensive) Discharge Coordination PROCESS - Special Needs []  - 0 Pediatric / Minor Patient Management []  - 0 Isolation Patient Management []  - 0 Hearing / Language / Visual special needs []  - 0 Assessment of Community assistance (transportation, D/C planning,  etc.) []  - 0 Additional assistance / Altered mentation []  - 0 Support Surface(s) Assessment (bed, cushion, seat, etc.) INTERVENTIONS - Wound Cleansing / Measurement []  - 0 Simple Wound Cleansing - one wound X-  2 5 Complex Wound Cleansing - multiple wounds []  - 0 Wound Imaging (photographs - any number of wounds) []  - 0 Wound Tracing (instead of photographs) []  - 0 Simple Wound Measurement - one wound X- 2 5 Complex Wound Measurement - multiple wounds INTERVENTIONS - Wound Dressings []  - 0 Small Wound Dressing one or multiple wounds X- 2 15 Medium Wound Dressing one or multiple wounds []  - 0 Large Wound Dressing one or multiple wounds []  - 0 Application of Medications - topical []  - 0 Application of Medications - injection INTERVENTIONS - Miscellaneous []  - 0 External ear exam []  - 0 Specimen Collection (cultures, biopsies, blood, body fluids, etc.) []  - 0 Specimen(s) / Culture(s) sent or taken to Lab for analysis []  - 0 Patient Transfer (multiple staff / Civil Service fast streamer / Similar devices) []  - 0 Simple Staple / Suture removal (25 or less) []  - 0 Complex Staple / Suture removal (26 or more) []  - 0 Hypo / Hyperglycemic Management (close monitor of Blood Glucose) NIV, LUISI B ([id number]) [id number] Page 3 of 7 []  - 0 Ankle / Brachial Index (ABI) - do not check if billed separately X- 1 5 Vital Signs Has the patient been seen at the hospital within the last three years: Yes Total Score: 125 Level Of Care: New/Established - Level 4 Electronic Signature(s) Signed: 01/27/2022 5:28:37 PM By: Dellie Catholic RN Entered By: Dellie Catholic on 01/27/2022 10:02:22 -------------------------------------------------------------------------------- Encounter Discharge Information Details Patient Name: Date of Service: Keith Nones B. 01/27/2022 9:15 A M Medical Record Number: [id number] Patient Account Number: [id number] Date of Birth/Sex:  Treating RN: 15-May-1960 (61 y.o. [name] Primary Care Correna Meacham: [name], [name] Other Clinician: Referring Dechelle Attaway: Treating Debbrah Sampedro/Extender: Cannon, Jennifer [name], [name] Weeks in Treatment: 2 Encounter Discharge Information Items Discharge Condition: Stable Ambulatory Status: Ambulatory Discharge Destination: Home Transportation: Private Auto Accompanied By: self Schedule Follow-up Appointment: Yes Clinical Summary of Care: Patient Declined Electronic Signature(s) Signed: 01/27/2022 5:28:37 PM By: Dellie Catholic RN Entered By: Dellie Catholic on 01/27/2022 10:02:54 -------------------------------------------------------------------------------- Patient/Caregiver Education Details Patient Name: Date of Service: Cathie Hoops 10/24/2023andnbsp9:15 A M Medical Record Number: [id number] Patient Account Number: [id number] Date of Birth/Gender: Treating RN: 09/09/1960 (61 y.o. [name] Primary Care Physician: [name], [name] Other Clinician: Referring Physician: Treating Physician/Extender: Cannon, Jennifer [name], [name] Weeks in Treatment: 2 Education Assessment Education Provided To: Patient Education Topics Provided Wound/Skin Impairment: Methods: Explain/Verbal Responses: Return demonstration correctly Electronic Signature(s) Signed: 01/27/2022 5:28:37 PM By: Dellie Catholic RN Entered By: Dellie Catholic on 01/27/2022 10:02:38 Dohse, Rod Can ([id number]) [id number] Page 4 of 7 -------------------------------------------------------------------------------- Wound Assessment Details Patient Name: Date of Service: ALXANDER, CAPPELLA 01/27/2022 9:15 A M Medical Record Number: [id number] Patient Account Number: [id number] Date of Birth/Sex: Treating RN: [birth year] (61 y.o. [name] Primary Care Gerod Caligiuri: [name], [name] Other Clinician: Referring Millisa Giarrusso: Treating Faren Florence/Extender: Cannon, Jennifer [name],  [name] Weeks in Treatment: 2 Wound Status Wound Number: 1 Primary Etiology: Trauma, Other Wound Location: Right, Medial Lower Leg Wound Status: Open Wounding Event: Trauma Comorbid History: Hypertension Date Acquired: 12/13/2021 Weeks Of Treatment: 2 Clustered Wound: No Wound Measurements Length: (cm) 1.8 Width: (cm) 0.9 Depth: (cm) 0.2 Area: (cm) 1.272 Volume: (cm) 0.254 % Reduction in Area: -79.9% % Reduction in Volume: -257.7% Epithelialization: Small (1-33%) Tunneling: No Undermining: No Wound Description Classification: Full Thickness Without Exposed Suppor Wound Margin: Distinct, outline attached Exudate Amount: Medium Exudate Type: Serosanguineous Exudate Color: red, brown t Structures Foul Odor After Cleansing: No Slough/Fibrino  Yes Wound Bed Granulation Amount: Medium (34-66%) Exposed Structure Granulation Quality: Red, Pink Fascia Exposed: No Necrotic Amount: Medium (34-66%) Fat Layer (Subcutaneous Tissue) Exposed: Yes Necrotic Quality: Adherent Slough Tendon Exposed: No Muscle Exposed: No Joint Exposed: No Bone Exposed: No Periwound Skin Texture Texture Color No Abnormalities Noted: No No Abnormalities Noted: No Callus: No Atrophie Blanche: No Crepitus: No Cyanosis: No Excoriation: No Ecchymosis: No Induration: No Erythema: No Rash: No Hemosiderin Staining: No Scarring: No Mottled: No Pallor: No Moisture Rubor: No No Abnormalities Noted: No Dry / Scaly: No Temperature / Pain Maceration: No Temperature: No Abnormality Treatment Notes Wound #1 (Lower Leg) Wound Laterality: Right, Medial Cleanser Wound Cleanser Discharge Instruction: Cleanse the wound with wound cleanser prior to applying a clean dressing using gauze sponges, not tissue or cotton balls. Peri-Wound Care Topical CLELAND, GEHRIS ([id number]) [id number] Page 5 of 7 Gentamicin Discharge Instruction: As directed by physician Mupirocin  Ointment Discharge Instruction: Apply Mupirocin (Bactroban) as instructed Primary Dressing Hydrofera Blue Classic Foam, 2x2 in Discharge Instruction: Moisten with saline prior to applying to wound bed Secondary Dressing Woven Gauze Sponge, Non-Sterile 4x4 in Discharge Instruction: Apply over primary dressing as directed. Secured With Compression Wrap Kerlix Roll 4.5x3.1 (in/yd) Discharge Instruction: Apply Kerlix and Coban compression as directed. Coban Self-Adherent Wrap 4x5 (in/yd) Discharge Instruction: Apply over Kerlix as directed. Compression Stockings Add-Ons Electronic Signature(s) Signed: 01/27/2022 5:28:37 PM By: Dellie Catholic RN Entered By: Dellie Catholic on 01/27/2022 09:39:56 -------------------------------------------------------------------------------- Wound Assessment Details Patient Name: Date of Service: BRODIX, SALVINO 01/27/2022 9:15 A M Medical Record Number: [id number] Patient Account Number: [id number] Date of Birth/Sex: Treating RN: September 05, 1960 (61 y.o. [name] Primary Care Janee Ureste: [name], [name] Other Clinician: Referring Suzette Flagler: Treating Amman Bartel/Extender: Cannon, Jennifer [name], [name] Weeks in Treatment: 2 Wound Status Wound Number: 2 Primary Etiology: Trauma, Other Wound Location: Right, Distal, Medial Lower Leg Wound Status: Open Wounding Event: Trauma Comorbid History: Hypertension Date Acquired: 12/13/2021 Weeks Of Treatment: 2 Clustered Wound: Yes Wound Measurements Length: (cm) Width: (cm) Depth: (cm) Clustered Quantity: Area: (cm) Volume: (cm) 1.2 % Reduction in Area: 14.3% 2 % Reduction in Volume: -71.4% 0.2 Epithelialization: Small (1-33%) 2 Tunneling: No 1.885 Undermining: No 0.377 Wound Description Classification: Full Thickness Without Exposed Sup Wound Margin: Distinct, outline attached Exudate Amount: Medium Exudate Type: Serosanguineous Exudate Color: red, brown port Structures Foul Odor After  Cleansing: No Slough/Fibrino Yes Wound Bed Granulation Amount: Medium (34-66%) Exposed Structure Granulation Quality: Red, Pink Fascia Exposed: No Necrotic Amount: Medium (34-66%) Fat Layer (Subcutaneous Tissue) Exposed: Yes Necrotic Quality: Adherent Slough Tendon Exposed: No ARTAVIS, GUZZARDO ([id number]) [id number] Page 6 of 7 Muscle Exposed: No Joint Exposed: No Bone Exposed: No Periwound Skin Texture Texture Color No Abnormalities Noted: No No Abnormalities Noted: No Callus: No Atrophie Blanche: No Crepitus: No Cyanosis: No Excoriation: No Ecchymosis: No Induration: No Erythema: No Rash: No Hemosiderin Staining: No Scarring: No Mottled: No Pallor: No Moisture Rubor: No No Abnormalities Noted: No Dry / Scaly: No Temperature / Pain Maceration: No Temperature: No Abnormality Treatment Notes Wound #2 (Lower Leg) Wound Laterality: Right, Medial, Distal Cleanser Wound Cleanser Discharge Instruction: Cleanse the wound with wound cleanser prior to applying a clean dressing using gauze sponges, not tissue or cotton balls. Peri-Wound Care Topical Gentamicin Discharge Instruction: As directed by physician Mupirocin Ointment Discharge Instruction: Apply Mupirocin (Bactroban) as instructed Primary Dressing Hydrofera Blue Classic Foam, 2x2 in Discharge Instruction: Moisten with saline prior to applying to wound bed Secondary Dressing Woven Gauze Sponge, Non-Sterile  4x4 in Discharge Instruction: Apply over primary dressing as directed. Secured With Compression Wrap Kerlix Roll 4.5x3.1 (in/yd) Discharge Instruction: Apply Kerlix and Coban compression as directed. Coban Self-Adherent Wrap 4x5 (in/yd) Discharge Instruction: Apply over Kerlix as directed. Compression Stockings Add-Ons Electronic Signature(s) Signed: 01/27/2022 5:28:37 PM By: Dellie Catholic RN Entered By: Dellie Catholic on 01/27/2022  09:40:17 -------------------------------------------------------------------------------- Vitals Details Patient Name: Date of Service: Keith Nones B. 01/27/2022 9:15 A M Medical Record Number: [id number] Patient Account Number: [id number] Date of Birth/Sex: Treating RN: [birth year] (62 y.o. [name] Primary Care Hurbert Duran: [name], [name] Other Clinician: Referring Craig Ionescu: Treating Johnie Stadel/Extender: Cannon, Jennifer [name], [name] Weeks in Treatment: 2 KOH, AMOR ([id number]) [id number] Page 7 of 7 Vital Signs Time Taken: 09:38 Temperature (F): 97.7 Height (in): 72 Pulse (bpm): 55 Weight (lbs): 190 Respiratory Rate (breaths/min): 18 Body Mass Index (BMI): 25.8 Blood Pressure (mmHg): 139/92 Reference Range: 80 - 120 mg / dl Electronic Signature(s) Signed: 01/27/2022 5:28:37 PM By: Dellie Catholic RN Entered By: Dellie Catholic on 01/27/2022 09:39:04

## 2022-01-29 NOTE — Progress Notes (Signed)
Keith, Mooney ([id number]) [id number] Page 1 of 2 Visit Report for 01/23/2022 Physician Orders Details Patient Name: Date of Service: [name], [name] 01/23/2022 9:30 A M Medical Record Number: [id number] Patient Account Number: [id number] Date of Birth/Sex: Treating RN: April 22, 1960 (61 y.o. Erie Noe Primary Care Provider: [name], [name] Other Clinician: Referring Provider: Treating Provider/Extender: Daxson Reffett [name], [name] Weeks in Treatment: 1 Verbal / Phone Orders: No Diagnosis Coding Follow-up Appointments ppointment in 1 week. - Dr Heber Oreana and Allayne Butcher - Room 9 on Tuesday Return A Anesthetic Wound #1 Right,Medial Lower Leg (In clinic) Topical Lidocaine 5% applied to wound bed Wound #2 Right,Distal,Medial Lower Leg (In clinic) Topical Lidocaine 5% applied to wound bed Bathing/ Shower/ Hygiene May shower and wash wound with soap and water. - leave bandage on in shower, remove at end of shower wash wound with dial antibacterial soap or wound cleanser, redress wound after shower Edema Control - Lymphedema / SCD / Other Right Lower Extremity Elevate legs to the level of the heart or above for 30 minutes daily and/or when sitting, a frequency of: Avoid standing for long periods of time. Exercise regularly Moisturize legs daily. Wound Treatment Wound #1 - Lower Leg Wound Laterality: Right, Medial Cleanser: Wound Cleanser 1 x Per Week/15 Days Discharge Instructions: Cleanse the wound with wound cleanser prior to applying a clean dressing using gauze sponges, not tissue or cotton balls. Topical: Gentamicin 1 x Per Week/15 Days Discharge Instructions: As directed by physician Topical: Mupirocin Ointment 1 x Per Week/15 Days Discharge Instructions: Apply Mupirocin (Bactroban) as instructed Prim Dressing: Hydrofera Blue Classic Foam, 2x2 in (Generic) 1 x Per Week/15 Days ary Discharge Instructions: Moisten with saline prior to  applying to wound bed Secondary Dressing: Woven Gauze Sponge, Non-Sterile 4x4 in (Generic) 1 x Per Week/15 Days Discharge Instructions: Apply over primary dressing as directed. Compression Wrap: Kerlix Roll 4.5x3.1 (in/yd) 1 x Per Week/15 Days Discharge Instructions: Apply Kerlix and Coban compression as directed. Compression Wrap: Coban Self-Adherent Wrap 4x5 (in/yd) 1 x Per Week/15 Days Discharge Instructions: Apply over Kerlix as directed. Wound #2 - Lower Leg Wound Laterality: Right, Medial, Distal Cleanser: Wound Cleanser 1 x Per Week/15 Days Discharge Instructions: Cleanse the wound with wound cleanser prior to applying a clean dressing using gauze sponges, not tissue or cotton balls. Topical: Gentamicin 1 x Per Week/15 Days Discharge Instructions: As directed by physician Topical: Mupirocin Ointment 1 x Per Week/15 Days Discharge Instructions: Apply Mupirocin (Bactroban) as instructed Prim Dressing: Hydrofera Blue Classic Foam, 2x2 in (Generic) 1 x Per Week/15 Days Keith, Mooney ([id number]) [id number] Page 2 of 2 Discharge Instructions: Moisten with saline prior to applying to wound bed Secondary Dressing: Woven Gauze Sponge, Non-Sterile 4x4 in (Generic) 1 x Per Week/15 Days Discharge Instructions: Apply over primary dressing as directed. Compression Wrap: Kerlix Roll 4.5x3.1 (in/yd) 1 x Per Week/15 Days Discharge Instructions: Apply Kerlix and Coban compression as directed. Compression Wrap: Coban Self-Adherent Wrap 4x5 (in/yd) 1 x Per Week/15 Days Discharge Instructions: Apply over Kerlix as directed. Electronic Signature(s) Signed: 01/23/2022 11:55:19 AM By: Kalman Shan DO Signed: 01/29/2022 4:44:37 PM By: Rhae Hammock RN Entered By: Rhae Hammock on 01/23/2022 10:11:54 -------------------------------------------------------------------------------- SuperBill Details Patient Name: Date of Service: Keith, Dorisann Mooney.  01/23/2022 Medical Record Number: [id number] Patient Account Number: [id number] Date of Birth/Sex: Treating RN: 1960/05/27 (61 y.o. Erie Noe Primary Care Provider: [name], [name] Other Clinician: Referring Provider: Treating Provider/Extender: Valta Dillon [name], [name] Weeks in Treatment: 1 Diagnosis Coding  ICD-10 Codes Code Description [phone] Non-pressure chronic ulcer of other part of right lower leg with fat layer exposed T79.8XXA Other early complications of trauma, initial encounter N18.30 Chronic kidney disease, stage 3 unspecified I87.311 Chronic venous hypertension (idiopathic) with ulcer of right lower extremity Facility Procedures : CPT4 Code: [id number] Description: [id number] - WOUND CARE VISIT-LEV 4 EST PT Modifier: Quantity: 1 Electronic Signature(s) Signed: 01/23/2022 11:55:19 AM By: Kalman Shan DO Signed: 01/29/2022 4:44:37 PM By: Rhae Hammock RN Entered By: Rhae Hammock on 01/23/2022 10:18:11

## 2022-01-29 NOTE — Progress Notes (Signed)
NAZIM, KADLEC ([id number]) [id number] Page 1 of 8 Visit Report for 01/23/2022 Arrival Information Details Patient Name: Date of Service: [name], [name] 01/23/2022 9:30 A M Medical Record Number: [id number] Patient Account Number: [id number] Date of Birth/Sex: Treating RN: 11/22/1960 (61 y.o. [name], Millard.[place] Primary Care [name]: Mooney, Keith Other Clinician: Referring [name]: Treating [name]/Extender: [name], [name], [name] in Treatment: 1 Visit Information History Since Last Visit Added or deleted any medications: No Patient Arrived: Ambulatory Any new allergies or adverse reactions: No Arrival Time: 09:37 Had a fall or experienced change in No Accompanied By: self activities of daily living that may affect Transfer Assistance: None risk of falls: Patient Identification Verified: Yes Signs or symptoms of abuse/neglect since last visito No Secondary Verification Process Completed: Yes Hospitalized since last visit: No Patient Requires Transmission-Based Precautions: No Implantable device outside of the clinic excluding No Patient Has Alerts: No cellular tissue based products placed in the center since last visit: Has Dressing in Place as Prescribed: Yes Has Compression in Place as Prescribed: Yes Pain Present Now: Yes Electronic Signature(s) Signed: 01/23/2022 6:08:03 PM By: Shawn Stall RN, BSN Entered By: Shawn Stall on 01/23/2022 09:37:28 -------------------------------------------------------------------------------- Clinic Level of Care Assessment Details Patient Name: Date of Service: [name], [name] 01/23/2022 9:30 A M Medical Record Number: [id number] Patient Account Number: [id number] Date of Birth/Sex: Treating RN: 1960/07/04 (61 y.o. [name], Keith Primary Care [name]: Mooney, Keith Other Clinician: Referring Jareb Radoncic: Treating [name]/Extender: [name], [name], [name] in  Treatment: 1 Clinic Level of Care Assessment Items TOOL 4 Quantity Score X- 1 0 Use when only an EandM is performed on FOLLOW-UP visit ASSESSMENTS - Nursing Assessment / Reassessment X- 1 10 Reassessment of Co-morbidities (includes updates in patient status) X- 1 5 Reassessment of Adherence to Treatment Plan ASSESSMENTS - Wound and Skin A ssessment / Reassessment X - Simple Wound Assessment / Reassessment - one wound 1 5 []  - 0 Complex Wound Assessment / Reassessment - multiple wounds []  - 0 Dermatologic / Skin Assessment (not related to wound area) ASSESSMENTS - Focused Assessment X- 1 5 Circumferential Edema Measurements - multi extremities []  - 0 Nutritional Assessment / Counseling / Intervention [name], [name] ( ) [id number] Page 2 of 8 []  - 0 Lower Extremity Assessment (monofilament, tuning fork, pulses) []  - 0 Peripheral Arterial Disease Assessment (using hand held doppler) ASSESSMENTS - Ostomy and/or Continence Assessment and Care []  - 0 Incontinence Assessment and Management []  - 0 Ostomy Care Assessment and Management (repouching, etc.) PROCESS - Coordination of Care X - Simple Patient / Family Education for ongoing care 1 15 []  - 0 Complex (extensive) Patient / Family Education for ongoing care X- 1 10 Staff obtains [name], Records, T Results / Process Orders est []  - 0 Staff telephones HHA, Nursing Homes / Clarify orders / etc []  - 0 Routine Transfer to another Facility (non-emergent condition) []  - 0 Routine Hospital Admission (non-emergent condition) []  - 0 New Admissions / [id number] / Ordering NPWT Apligraf, etc. , []  - 0 Emergency Hospital Admission (emergent condition) X- 1 10 Simple Discharge Coordination []  - 0 Complex (extensive) Discharge Coordination PROCESS - Special Needs []  - 0 Pediatric / Minor Patient Management []  - 0 Isolation Patient Management []  - 0 Hearing / Language /  Visual special needs []  - 0 Assessment of Community assistance (transportation, D/C planning, etc.) []  - 0 Additional assistance / Altered mentation []  - 0 Support Surface(s) Assessment (bed, cushion, seat, etc.) INTERVENTIONS -  Wound Cleansing / Measurement []  - 0 Simple Wound Cleansing - one wound X- 2 5 Complex Wound Cleansing - multiple wounds X- 1 5 Wound Imaging (photographs - any number of wounds) []  - 0 Wound Tracing (instead of photographs) []  - 0 Simple Wound Measurement - one wound X- 2 5 Complex Wound Measurement - multiple wounds INTERVENTIONS - Wound Dressings []  - 0 Small Wound Dressing one or multiple wounds X- 2 15 Medium Wound Dressing one or multiple wounds []  - 0 Large Wound Dressing one or multiple wounds []  - 0 Application of Medications - topical []  - 0 Application of Medications - injection INTERVENTIONS - Miscellaneous []  - 0 External ear exam []  - 0 Specimen Collection (cultures, biopsies, blood, body fluids, etc.) []  - 0 Specimen(s) / Culture(s) sent or taken to Lab for analysis []  - 0 Patient Transfer (multiple staff / / Similar devices) []  - 0 Simple Staple / Suture removal (25 or less) []  - 0 Complex Staple / Suture removal (26 or more) []  - 0 Hypo / Hyperglycemic Management (close monitor of Blood Glucose) [name], [name] ( ) [id number] Page 3 of 8 []  - 0 Ankle / Brachial Index (ABI) - do not check if billed separately X- 1 5 Vital Signs Has the patient been seen at the hospital within the last three years: Yes Total Score: 120 Level Of Care: New/Established - Level 4 Electronic Signature(s) Signed: 01/29/2022 4:44:37 PM By: RN Entered By: on 01/23/2022 10:18:07 -------------------------------------------------------------------------------- Encounter Discharge Information Details Patient Name: Date of Service: Mooney. 01/23/2022 9:30  A M Medical Record Number: Patient Account Number: Date of Birth/Sex: Treating RN: [birth year] (61 y.o. , Keith Primary Care Samiel Peel: , Keith Other Clinician: Referring Shirlee Whitmire: Treating Bartley Vuolo/Extender: [name], Keith [id number], [name] in Treatment: 1 Encounter Discharge Information Items Discharge Condition: Stable Ambulatory Status: Ambulatory Discharge Destination: Home Transportation: Private Auto Accompanied By: self Schedule Follow-up Appointment: Yes Clinical Summary of Care: Patient Declined Electronic Signature(s) Signed: 01/29/2022 4:44:37 PM By: RN Entered By: 01/31/2022 on 01/23/2022 10:17:26 -------------------------------------------------------------------------------- Lower Extremity Assessment Details Patient Name: Date of Service: [place], 01/25/2022 Mooney. 01/23/2022 9:30 A M Medical Record Number: 01/25/2022 Patient Account Number: [id number] Date of Birth/Sex: Treating RN: 1960-07-23 (61 y.o. 68 Primary Care Sarafina Puthoff: [name], Keith Other Clinician: Referring Tadhg Eskew: Treating Lanique Gonzalo/Extender: [name], [name], [name] in Treatment: 1 Edema Assessment Assessed: [Left: No] [Right: Yes] Edema: [Left: N] [Right: o] Calf Left: Right: Point of Measurement: From Medial Instep 37 cm Ankle Left: Right: Point of Measurement: From Medial Instep 23 cm Vascular Assessment Left: [[id number] Page 4 of 8Right:] Pulses: Dorsalis Pedis Palpable: [[id number] Page 4 of 8Yes] Electronic Signature(s) Signed: 01/23/2022 6:08:03 PM By: Fonnie Mu RN, BSN Entered By: 01/25/2022 on 01/23/2022 09:40:21 -------------------------------------------------------------------------------- Multi-Disciplinary Care Plan Details Patient Name: Date of Service: [place], 01/25/2022 LTER Mooney. 01/23/2022 9:30 A M Medical Record Number:  [id number] Patient Account Number: 03/05/1961 Date of Birth/Sex: Treating RN: 04/11/1960 (61 y.o. Mooney, Keith Primary Care Cianna Kasparian: Mooney, Keith Other Clinician: Referring Anahita Cua: Treating Myson Levi/Extender: [name], Keith 03-16-1974, [name] in Treatment: 1 Active Inactive Wound/Skin Impairment Nursing Diagnoses: Impaired tissue integrity Knowledge deficit related to ulceration/compromised skin integrity Goals: Patient/caregiver will verbalize understanding of skin care regimen Date Initiated: 01/13/2022 Target Resolution Date: 02/10/2022 Goal Status: Active Ulcer/skin breakdown will have a volume reduction of 30% by week 4 Date Initiated: 01/13/2022 Target Resolution Date: 02/10/2022 Goal Status:  Active Interventions: Assess patient/caregiver ability to obtain necessary supplies Assess patient/caregiver ability to perform ulcer/skin care regimen upon admission and as needed Assess ulceration(s) every visit Provide education on ulcer and skin care Notes: Electronic Signature(s) Signed: 01/29/2022 4:44:37 PM By: Rhae Hammock RN Entered By: Rhae Hammock on 01/23/2022 10:14:05 -------------------------------------------------------------------------------- Pain Assessment Details Patient Name: Date of Service: Keith Nones Mooney. 01/23/2022 9:30 A M Medical Record Number: [id number] Patient Account Number: [id number] Date of Birth/Sex: Treating RN: 1960/08/19 (60 y.o. [name] Primary Care Lenville Hibberd: [name], Keith Other Clinician: Referring Auri Jahnke: Treating Ryne Mctigue/Extender: [name], Keith [name], [name] in Treatment: [address] MAKKI, Rod Can ([id number]) [id number] Page 5 of 8 Location of Pain Severity and Description of Pain Patient Has Paino Yes Site Locations Pain Location: Pain in Ulcers Rate the pain. Current Pain Level: 5 Character of Pain Describe the Pain: Burning Pain Management and  Medication Current Pain Management: Medication: No Cold Application: No Rest: No Massage: No Activity: No T.E.N.S.: No Heat Application: No Leg drop or elevation: No Is the Current Pain Management Adequate: Adequate How does your wound impact your activities of daily livingo Sleep: No Bathing: No Appetite: No Relationship With Others: No Bladder Continence: No Emotions: No Bowel Continence: No Work: No Toileting: No Drive: No Dressing: No Hobbies: No Engineer, maintenance) Signed: 01/23/2022 6:08:03 PM By: Deon Pilling RN, BSN Entered By: Deon Pilling on 01/23/2022 09:37:48 -------------------------------------------------------------------------------- Patient/Caregiver Education Details Patient Name: Date of Service: [name] 10/20/2023andnbsp9:30 [place] Record Number: [id number] Patient Account Number: [id number] Date of Birth/Gender: Treating RN: 1960/08/15 (61 y.o. Erie Noe Primary Care Physician: [name], Keith Other Clinician: Referring Physician: Treating Physician/Extender: [name], Keith [name], [name] in Treatment: 1 Education Assessment Education Provided To: Patient Education Topics Provided Wound/Skin Impairment: Methods: Explain/Verbal Responses: Reinforcements needed, State content correctly [name], [name] ([id number]) [id number] Page 6 of 8 Electronic Signature(s) Signed: 01/29/2022 4:44:37 PM By: Rhae Hammock RN Entered By: Rhae Hammock on 01/23/2022 10:17:17 -------------------------------------------------------------------------------- Wound Assessment Details Patient Name: Date of Service: [name], Keith Mead Mooney. 01/23/2022 9:30 A M Medical Record Number: [id number] Patient Account Number: [id number] Date of Birth/Sex: Treating RN: May 26, 1960 (61 y.o. [name] Primary Care Asli Tokarski: [name], Keith Other Clinician: Referring Dare Sanger: Treating Cathlyn Tersigni/Extender:  [name], Keith [name], [name] in Treatment: 1 Wound Status Wound Number: 1 Primary Etiology: Trauma, Other Wound Location: Right, Medial Lower Leg Wound Status: Open Wounding Event: Trauma Comorbid History: Hypertension Date Acquired: 12/13/2021 Mooney Of Treatment: 1 Clustered Wound: No Photos Wound Measurements Length: (cm) 1.8 Width: (cm) 0.9 Depth: (cm) 0.2 Area: (cm) 1.272 Volume: (cm) 0.254 % Reduction in Area: -79.9% % Reduction in Volume: -257.7% Epithelialization: Small (1-33%) Tunneling: No Undermining: No Wound Description Classification: Full Thickness Without Exposed Suppor Wound Margin: Distinct, outline attached Exudate Amount: Medium Exudate Type: Serosanguineous Exudate Color: red, brown t Structures Foul Odor After Cleansing: No Slough/Fibrino Yes Wound Bed Granulation Amount: Medium (34-66%) Exposed Structure Granulation Quality: Red, Pink Fascia Exposed: No Necrotic Amount: Medium (34-66%) Fat Layer (Subcutaneous Tissue) Exposed: Yes Necrotic Quality: Adherent Slough Tendon Exposed: No Muscle Exposed: No Joint Exposed: No Bone Exposed: No Periwound Skin Texture Texture Color No Abnormalities Noted: No No Abnormalities Noted: No Callus: No Atrophie Blanche: No Crepitus: No Cyanosis: No Excoriation: No Ecchymosis: No Induration: No Erythema: No JASKIRAT, SCHWIEGER ([id number]) [id number] Page 7 of 8 Rash: No Hemosiderin Staining: No Scarring: No Mottled: No Pallor: No Moisture Rubor: No No Abnormalities Noted: No Dry / Scaly: No  Temperature / Pain Maceration: No Temperature: No Abnormality Electronic Signature(s) Signed: 01/23/2022 6:08:03 PM By: Shawn Stall RN, BSN Signed: 01/29/2022 4:44:37 PM By: Fonnie Mu RN Entered By: Fonnie Mu on 01/23/2022 09:43:31 -------------------------------------------------------------------------------- Wound Assessment Details Patient Name: Date  of Service: [place], [name] 01/23/2022 9:30 A M Medical Record Number: [id number] Patient Account Number: [id number] Date of Birth/Sex: Treating RN: 05-09-1960 (61 y.o. [name] Primary Care Sahalie Beth: Mooney, Keith Other Clinician: Referring Atlee Kluth: Treating Jarelyn Bambach/Extender: [name], [name], [name] in Treatment: 1 Wound Status Wound Number: 2 Primary Etiology: Trauma, Other Wound Location: Right, Distal, Medial Lower Leg Wound Status: Open Wounding Event: Trauma Comorbid History: Hypertension Date Acquired: 12/13/2021 Mooney Of Treatment: 1 Clustered Wound: Yes Photos Wound Measurements Length: (cm) Width: (cm) Depth: (cm) Clustered Quantity: Area: (cm) Volume: (cm) 1.2 % Reduction in Area: 14.3% 2 % Reduction in Volume: -71.4% 0.2 Epithelialization: Small (1-33%) 2 Tunneling: No 1.885 Undermining: No 0.377 Wound Description Classification: Full Thickness Without Exposed Sup Wound Margin: Distinct, outline attached Exudate Amount: Medium Exudate Type: Serosanguineous Exudate Color: red, brown port Structures Foul Odor After Cleansing: No Slough/Fibrino Yes Wound Bed Granulation Amount: Medium (34-66%) Exposed Structure Granulation Quality: Red, Pink Fascia Exposed: No Necrotic Amount: Medium (34-66%) Fat Layer (Subcutaneous Tissue) Exposed: Yes Necrotic Quality: Adherent Slough Tendon Exposed: No Muscle Exposed: No Joint Exposed: No [name], [name] ([id number]) [id number] Page 8 of 8 Bone Exposed: No Periwound Skin Texture Texture Color No Abnormalities Noted: No No Abnormalities Noted: No Callus: No Atrophie Blanche: No Crepitus: No Cyanosis: No Excoriation: No Ecchymosis: No Induration: No Erythema: No Rash: No Hemosiderin Staining: No Scarring: No Mottled: No Pallor: No Moisture Rubor: No No Abnormalities Noted: No Dry / Scaly: No Temperature / Pain Maceration: No Temperature: No  Abnormality Electronic Signature(s) Signed: 01/23/2022 6:08:03 PM By: Shawn Stall RN, BSN Signed: 01/29/2022 4:44:37 PM By: Fonnie Mu RN Entered By: Fonnie Mu on 01/23/2022 09:43:54 -------------------------------------------------------------------------------- Vitals Details Patient Name: Date of Service: Keith Good Mooney. 01/23/2022 9:30 A M Medical Record Number: [id number] Patient Account Number: [id number] Date of Birth/Sex: Treating RN: Jan 17, 1961 (61 y.o. [name] Primary Care Kasheena Sambrano: Mooney, Keith Other Clinician: Referring Jacara Benito: Treating Jalynn Waddell/Extender: [name], [name], [name] in Treatment: 1 Vital Signs Time Taken: 09:35 Temperature (F): 97.9 Pulse (bpm): 66 Respiratory Rate (breaths/min): 20 Blood Pressure (mmHg): 145/94 Reference Range: 80 - 120 mg / dl Electronic Signature(s) Signed: 01/23/2022 6:08:03 PM By: Shawn Stall RN, BSN Entered By: Shawn Stall on 01/23/2022 09:37:39

## 2022-02-03 ENCOUNTER — Encounter (HOSPITAL_BASED_OUTPATIENT_CLINIC_OR_DEPARTMENT_OTHER): Payer: Commercial Managed Care - PPO | Admitting: Internal Medicine

## 2022-02-03 DIAGNOSIS — L97812 Non-pressure chronic ulcer of other part of right lower leg with fat layer exposed: Secondary | ICD-10-CM | POA: Diagnosis not present

## 2022-02-03 DIAGNOSIS — I87311 Chronic venous hypertension (idiopathic) with ulcer of right lower extremity: Secondary | ICD-10-CM | POA: Diagnosis not present

## 2022-02-06 NOTE — Progress Notes (Signed)
Keith, Mooney ([id number]) [id number] Page 1 of 9 Visit Report for 02/03/2022 Arrival Information Details Patient Name: Date of Service: [name], [name] 02/03/2022 9:30 A M Medical Record Number: [id number] Patient Account Number: [id number] Date of Birth/Sex: Treating RN: 1960/10/21 (61 y.o. M) Primary Care Larri Yehle: [place], [name] Other Clinician: Referring Izzabell Klasen: Treating Markeita Alicia/Extender: [name], Jessica [place], [name] Weeks in Treatment: 3 Visit Information History Since Last Visit Added or deleted any medications: No Patient Arrived: Ambulatory Any new allergies or adverse reactions: No Arrival Time: 09:42 Had a fall or experienced change in No Accompanied By: self activities of daily living that may affect Transfer Assistance: None risk of falls: Patient Identification Verified: Yes Signs or symptoms of abuse/neglect since last visito No Secondary Verification Process Completed: Yes Hospitalized since last visit: No Patient Requires Transmission-Based Precautions: No Implantable device outside of the clinic excluding No Patient Has Alerts: No cellular tissue based products placed in the center since last visit: Has Compression in Place as Prescribed: Yes Pain Present Now: No Electronic Signature(s) Signed: 02/05/2022 11:40:36 AM By: Thayer Dallas Entered By: Thayer Dallas on 02/03/2022 09:42:55 -------------------------------------------------------------------------------- Encounter Discharge Information Details Patient Name: Date of Service: Keith Good B. 02/03/2022 9:30 A M Medical Record Number: [id number] Patient Account Number: [id number] Date of Birth/Sex: Treating RN: [birth year] (61 y.o. Cline Cools Primary Care Barbarita Hutmacher: [place], [name] Other Clinician: Referring Montrae Braithwaite: Treating Derica Leiber/Extender: [name], Jessica [place], [name] Weeks in Treatment: 3 Encounter Discharge Information Items Post Procedure  Vitals Discharge Condition: Stable Temperature (F): 98.1 Ambulatory Status: Ambulatory Pulse (bpm): 56 Discharge Destination: Home Respiratory Rate (breaths/min): 18 Transportation: Private Auto Blood Pressure (mmHg): 147/82 Accompanied By: self Schedule Follow-up Appointment: Yes Clinical Summary of Care: Patient Declined Electronic Signature(s) Signed: 02/03/2022 4:27:32 PM By: Redmond Pulling RN, BSN Entered By: Redmond Pulling on 02/03/2022 11:24:51 Hollenbaugh, Warnell Bureau ([id number]) [id number] Page 2 of 9 -------------------------------------------------------------------------------- Lower Extremity Assessment Details Patient Name: Date of Service: Keith, Mooney 02/03/2022 9:30 A M Medical Record Number: [id number] Patient Account Number: [id number] Date of Birth/Sex: Treating RN: 1960-06-07 (61 y.o. M) Primary Care Sashia Campas: [place], [name] Other Clinician: Referring Renzo Vincelette: Treating Sherman Donaldson/Extender: [name], Jessica [place], [name] Weeks in Treatment: 3 Edema Assessment Assessed: [Left: No] [Right: No] Edema: [Left: N] [Right: o] Calf Left: Right: Point of Measurement: From Medial Instep 37.5 cm Ankle Left: Right: Point of Measurement: From Medial Instep 23 cm Vascular Assessment Pulses: Dorsalis Pedis Palpable: [Right:Yes] Electronic Signature(s) Signed: 02/05/2022 11:40:36 AM By: Thayer Dallas Entered By: Thayer Dallas on 02/03/2022 09:58:04 -------------------------------------------------------------------------------- Multi Wound Chart Details Patient Name: Date of Service: Keith Good B. 02/03/2022 9:30 A M Medical Record Number: [id number] Patient Account Number: [id number] Date of Birth/Sex: Treating RN: 29-Jun-1960 (61 y.o. M) Primary Care Caylon Saine: [place], [name] Other Clinician: Referring Mcgregor Tinnon: Treating Kenzleigh Sedam/Extender: [name], Jessica [place], [name] Weeks in Treatment: 3 Vital Signs Height(in): 72 Pulse(bpm):  56 Weight(lbs): 190 Blood Pressure(mmHg): 147/82 Body Mass Index(BMI): 25.8 Temperature(F): 98.1 Respiratory Rate(breaths/min): 18 [1:Photos:] [N/A:N/A [id number] Page 3 of 9] Right, Medial Lower Leg Right, Distal, Medial Lower Leg N/A Wound Location: Trauma Trauma N/A Wounding Event: Trauma, Other Trauma, Other N/A Primary Etiology: Hypertension Hypertension N/A Comorbid History: 12/13/2021 12/13/2021 N/A Date Acquired: 3 3 N/A Weeks of Treatment: Open Open N/A Wound Status: No No N/A Wound Recurrence: No Yes N/A Clustered Wound: N/A 2 N/A Clustered Quantity: 1.4x1x0.1 1.4x1.8x0.1 N/A Measurements L x W x D (cm) 1.1 1.979 N/A A (cm) : rea 0.11 0.198 N/A Volume (cm) : -55.60% 10.00%  N/A % Reduction in A rea: -54.90% 10.00% N/A % Reduction in Volume: Full Thickness Without Exposed Full Thickness Without Exposed N/A Classification: Support Structures Support Structures Medium Medium N/A Exudate A mount: Serosanguineous Serosanguineous N/A Exudate Type: red, brown red, brown N/A Exudate Color: Distinct, outline attached Distinct, outline attached N/A Wound Margin: None Present (0%) None Present (0%) N/A Granulation A mount: Large (67-100%) Large (67-100%) N/A Necrotic A mount: Eschar, Adherent Slough Eschar, Adherent Slough N/A Necrotic Tissue: Fat Layer (Subcutaneous Tissue): Yes Fat Layer (Subcutaneous Tissue): Yes N/A Exposed Structures: Fascia: No Fascia: No Tendon: No Tendon: No Muscle: No Muscle: No Joint: No Joint: No Bone: No Bone: No Small (1-33%) Small (1-33%) N/A Epithelialization: Debridement - Excisional Debridement - Excisional N/A Debridement: Pre-procedure Verification/Time Out 10:21 10:21 N/A Taken: Lidocaine 5% topical ointment Lidocaine 5% topical ointment N/A Pain Control: Subcutaneous, Slough Subcutaneous, Slough N/A Tissue Debrided: Skin/Subcutaneous Tissue Skin/Subcutaneous Tissue  N/A Level: 1.4 2.52 N/A Debridement A (sq cm): rea Curette Curette N/A Instrument: Minimum Minimum N/A Bleeding: Pressure Pressure N/A Hemostasis A chieved: Procedure was tolerated well Procedure was tolerated well N/A Debridement Treatment Response: 1.4x1x0.1 1.4x1.8x0.1 N/A Post Debridement Measurements L x W x D (cm) 0.11 0.198 N/A Post Debridement Volume: (cm) Excoriation: No Excoriation: No N/A Periwound Skin Texture: Induration: No Induration: No Callus: No Callus: No Crepitus: No Crepitus: No Rash: No Rash: No Scarring: No Scarring: No Maceration: No Maceration: No N/A Periwound Skin Moisture: Dry/Scaly: No Dry/Scaly: No Atrophie Blanche: No Atrophie Blanche: No N/A Periwound Skin Color: Cyanosis: No Cyanosis: No Ecchymosis: No Ecchymosis: No Erythema: No Erythema: No Hemosiderin Staining: No Hemosiderin Staining: No Mottled: No Mottled: No Pallor: No Pallor: No Rubor: No Rubor: No No Abnormality No Abnormality N/A Temperature: Debridement Debridement N/A Procedures Performed: Treatment Notes Electronic Signature(s) Signed: 02/06/2022 1:40:10 PM By: Geralyn Corwin DO Entered By: Geralyn Corwin on 02/03/2022 10:48:51 -------------------------------------------------------------------------------- Multi-Disciplinary Care Plan Details Patient Name: Date of Service: Keith Good B. 02/03/2022 9:30 A M Medical Record Number: [id number] Patient Account Number: [id number] FIRMAN, PETROW ([id number]) [id number] Page 4 of 9 Date of Birth/Sex: Treating RN: [birth year] (61 y.o. Marlan Palau Primary Care Kevionna Heffler: Other Clinician: [place], [name] Referring Tyree Fluharty: Treating Mirabella Hilario/Extender: [name], Jessica [place], [name] Weeks in Treatment: 3 Active Inactive Wound/Skin Impairment Nursing Diagnoses: Impaired tissue integrity Knowledge deficit related to ulceration/compromised skin  integrity Goals: Patient/caregiver will verbalize understanding of skin care regimen Date Initiated: 01/13/2022 Target Resolution Date: 02/10/2022 Goal Status: Active Ulcer/skin breakdown will have a volume reduction of 30% by week 4 Date Initiated: 01/13/2022 Target Resolution Date: 02/10/2022 Goal Status: Active Interventions: Assess patient/caregiver ability to obtain necessary supplies Assess patient/caregiver ability to perform ulcer/skin care regimen upon admission and as needed Assess ulceration(s) every visit Provide education on ulcer and skin care Notes: Electronic Signature(s) Signed: 02/03/2022 4:24:58 PM By: Samuella Bruin Entered By: Samuella Bruin on 02/03/2022 10:20:33 -------------------------------------------------------------------------------- Pain Assessment Details Patient Name: Date of Service: Keith Good B. 02/03/2022 9:30 A M Medical Record Number: [id number] Patient Account Number: [id number] Date of Birth/Sex: Treating RN: Dec 01, 1960 (61 y.o. M) Primary Care Kahliya Fraleigh: [place], [name] Other Clinician: Referring Jovan Schickling: Treating Lemont Sitzmann/Extender: [name], Jessica [place], [name] Weeks in Treatment: 3 Active Problems Location of Pain Severity and Description of Pain Patient Has Paino No Site Locations Dheeraj, Hail Minden B ([id number]) [id number] Page 5 of 9 Pain Management and Medication Current Pain Management: Electronic Signature(s) Signed: 02/05/2022 11:40:36 AM By: Thayer Dallas Entered By: Thayer Dallas on 02/03/2022 09:45:26 -------------------------------------------------------------------------------- Patient/Caregiver Education Details  Patient Name: Date of Service: BAILEY, KOLBE 10/31/2023andnbsp9:30 A M Medical Record Number: [id number] Patient Account Number: [id number] Date of Birth/Gender: Treating RN: 1960/09/08 (61 y.o. Marlan Palau Primary Care Physician: [place], [name] Other  Clinician: Referring Physician: Treating Physician/Extender: [name], Jessica [place], [name] Weeks in Treatment: 3 Education Assessment Education Provided To: Patient Education Topics Provided Wound/Skin Impairment: Methods: Explain/Verbal Responses: Reinforcements needed, State content correctly Electronic Signature(s) Signed: 02/03/2022 4:24:58 PM By: Samuella Bruin Entered By: Samuella Bruin on 02/03/2022 10:20:43 -------------------------------------------------------------------------------- Wound Assessment Details Patient Name: Date of Service: Keith Good B. 02/03/2022 9:30 A M Medical Record Number: [id number] Patient Account Number: [id number] Date of Birth/Sex: Treating RN: 14-Apr-1960 (61 y.o. M) Primary Care Briza Bark: [place], [name] Other Clinician: Referring Velicia Dejager: Treating Calliope Delangel/Extender: [name], Jessica [place], [name] Weeks in Treatment: 3 Wound Status Wound Number: 1 Primary Etiology: Trauma, Other Wound Location: Right, Medial Lower Leg Wound Status: Open Wounding Event: Trauma Comorbid History: Hypertension Date Acquired: 12/13/2021 Weeks Of Treatment: 3 Clustered Wound: No Photos IRAM, LUNDBERG ([id number]) [id number] Page 6 of 9 Wound Measurements Length: (cm) Width: (cm) Depth: (cm) Area: (cm) Volume: (cm) 1.4 % Reduction in Area: -55.6% 1 % Reduction in Volume: -54.9% 0.1 Epithelialization: Small (1-33%) 1.1 Tunneling: No 0.11 Undermining: No Wound Description Classification: Full Thickness Without Exposed Support Structures Wound Margin: Distinct, outline attached Exudate Amount: Medium Exudate Type: Serosanguineous Exudate Color: red, brown Foul Odor After Cleansing: No Slough/Fibrino Yes Wound Bed Granulation Amount: None Present (0%) Exposed Structure Necrotic Amount: Large (67-100%) Fascia Exposed: No Necrotic Quality: Eschar, Adherent Slough Fat Layer (Subcutaneous Tissue) Exposed:  Yes Tendon Exposed: No Muscle Exposed: No Joint Exposed: No Bone Exposed: No Periwound Skin Texture Texture Color No Abnormalities Noted: No No Abnormalities Noted: No Callus: No Atrophie Blanche: No Crepitus: No Cyanosis: No Excoriation: No Ecchymosis: No Induration: No Erythema: No Rash: No Hemosiderin Staining: No Scarring: No Mottled: No Pallor: No Moisture Rubor: No No Abnormalities Noted: No Dry / Scaly: No Temperature / Pain Maceration: No Temperature: No Abnormality Treatment Notes Wound #1 (Lower Leg) Wound Laterality: Right, Medial Cleanser Wound Cleanser Discharge Instruction: Cleanse the wound with wound cleanser prior to applying a clean dressing using gauze sponges, not tissue or cotton balls. Peri-Wound Care Topical Gentamicin Discharge Instruction: As directed by physician Mupirocin Ointment Discharge Instruction: Apply Mupirocin (Bactroban) as instructed Primary Dressing Hydrofera Blue Classic Foam, 2x2 in Discharge Instruction: Moisten with saline prior to applying to wound bed Secondary Dressing Woven Gauze Sponge, Non-Sterile 4x4 in Discharge Instruction: Apply over primary dressing as directed. KIVON, APREA ([id number]) [id number] Page 7 of 9 Secured With Compression Wrap Kerlix Roll 4.5x3.1 (in/yd) Discharge Instruction: Apply Kerlix and Coban compression as directed. Coban Self-Adherent Wrap 4x5 (in/yd) Discharge Instruction: Apply over Kerlix as directed. Compression Stockings Add-Ons Electronic Signature(s) Signed: 02/03/2022 4:27:32 PM By: Redmond Pulling RN, BSN Entered By: Redmond Pulling on 02/03/2022 10:01:05 -------------------------------------------------------------------------------- Wound Assessment Details Patient Name: Date of Service: [place], [name] Lamas B. 02/03/2022 9:30 A M Medical Record Number: [id number] Patient Account Number: [id number] Date of Birth/Sex: Treating RN: [birth year] (61  y.o. M) Primary Care Demeco Ducksworth: [place], [name] Other Clinician: Referring Lindsi Bayliss: Treating Lyndzie Zentz/Extender: [name], Jessica [place], [name] Weeks in Treatment: 3 Wound Status Wound Number: 2 Primary Etiology: Trauma, Other Wound Location: Right, Distal, Medial Lower Leg Wound Status: Open Wounding Event: Trauma Comorbid History: Hypertension Date Acquired: 12/13/2021 Weeks Of Treatment: 3 Clustered Wound: Yes Photos Wound Measurements Length: (cm) Width: (cm) Depth: (cm) Clustered Quantity: Area: (cm) Volume: (cm)  1.4 % Reduction in Area: 10% 1.8 % Reduction in Volume: 10% 0.1 Epithelialization: Small (1-33%) 2 Tunneling: No 1.979 Undermining: No 0.198 Wound Description Classification: Full Thickness Without Exposed Sup Wound Margin: Distinct, outline attached Exudate Amount: Medium Exudate Type: Serosanguineous Exudate Color: red, brown port Structures Foul Odor After Cleansing: No Slough/Fibrino Yes Wound Bed Granulation Amount: None Present (0%) Exposed Structure Necrotic Amount: Large (67-100%) Fascia Exposed: No Necrotic Quality: Eschar, Adherent Slough Fat Layer (Subcutaneous Tissue) ExposedAVEREY, TROMPETER B ([id number]) [id number] Page 8 of 9 Tendon Exposed: No Muscle Exposed: No Joint Exposed: No Bone Exposed: No Periwound Skin Texture Texture Color No Abnormalities Noted: No No Abnormalities Noted: No Callus: No Atrophie Blanche: No Crepitus: No Cyanosis: No Excoriation: No Ecchymosis: No Induration: No Erythema: No Rash: No Hemosiderin Staining: No Scarring: No Mottled: No Pallor: No Moisture Rubor: No No Abnormalities Noted: No Dry / Scaly: No Temperature / Pain Maceration: No Temperature: No Abnormality Treatment Notes Wound #2 (Lower Leg) Wound Laterality: Right, Medial, Distal Cleanser Wound Cleanser Discharge Instruction: Cleanse the wound with wound cleanser prior to applying a clean dressing  using gauze sponges, not tissue or cotton balls. Peri-Wound Care Topical Gentamicin Discharge Instruction: As directed by physician Mupirocin Ointment Discharge Instruction: Apply Mupirocin (Bactroban) as instructed Primary Dressing Hydrofera Blue Classic Foam, 2x2 in Discharge Instruction: Moisten with saline prior to applying to wound bed Secondary Dressing Woven Gauze Sponge, Non-Sterile 4x4 in Discharge Instruction: Apply over primary dressing as directed. Secured With Compression Wrap Kerlix Roll 4.5x3.1 (in/yd) Discharge Instruction: Apply Kerlix and Coban compression as directed. Coban Self-Adherent Wrap 4x5 (in/yd) Discharge Instruction: Apply over Kerlix as directed. Compression Stockings Add-Ons Electronic Signature(s) Signed: 02/03/2022 4:27:32 PM By: Sharyn Creamer RN, BSN Entered By: Sharyn Creamer on 02/03/2022 10:02:00 -------------------------------------------------------------------------------- Vitals Details Patient Name: Date of Service: [place], [place] B. 02/03/2022 9:30 A M Medical Record Number: [id number] Patient Account Number: [id number] Date of Birth/Sex: Treating RN: November 12, 1960 (61 y.o. M) Primary Care Alan Riles: [name], [name] Other Clinician: Referring Mainor Hellmann: Treating Trevontae Lindahl/Extender: [name], Jessica [name], [name] Weeks in Treatment: [address] VIBHAV, WADDILL ([id number]) [id number] Page 9 of 9 Vital Signs Time Taken: 09:44 Temperature (F): 98.1 Height (in): 72 Pulse (bpm): 56 Weight (lbs): 190 Respiratory Rate (breaths/min): 18 Body Mass Index (BMI): 25.8 Blood Pressure (mmHg): 147/82 Reference Range: 80 - 120 mg / dl Electronic Signature(s) Signed: 02/05/2022 11:40:36 AM By: Erenest Blank Entered By: Erenest Blank on 02/03/2022 09:45:19

## 2022-02-06 NOTE — Progress Notes (Signed)
KOLTEN, RYBACK ([id number]) [id number] Page 1 of 9 Visit Report for 02/03/2022 Chief Complaint Document Details Patient Name: Date of Service: [name], [name] 02/03/2022 9:30 A M Medical Record Number: [id number] Patient Account Number: [id number] Date of Birth/Sex: Treating RN: 06/21/1960 (61 y.o. M) Primary Care Provider: [name], [name] Other Clinician: Referring Provider: Treating Provider/Extender: Preet Perrier [name], [name] Weeks in Treatment: 3 Information Obtained from: Patient Chief Complaint 01/13/2022; wounds to the right lower extremity secondary to trauma Electronic Signature(s) Signed: 02/06/2022 1:40:10 PM By: Kalman Shan DO Entered By: Kalman Shan on 02/03/2022 10:48:57 -------------------------------------------------------------------------------- Debridement Details Patient Name: Date of Service: Keith [place]ones B. 02/03/2022 9:30 A M Medical Record Number: [id number] Patient Account Number: [id number] Date of Birth/Sex: Treating RN: 06/09/1960 (61 y.o. [name] Primary Care Provider: [name], [name] Other Clinician: Referring Provider: Treating Provider/Extender: Jakiya Bookbinder [name], [name] Weeks in Treatment: 3 Debridement Performed for Assessment: Wound #1 Right,Medial Lower Leg Performed By: Physician Kalman Shan, DO Debridement Type: Debridement Level of Consciousness (Pre-procedure): Awake and Alert Pre-procedure Verification/Time Out Yes - 10:21 Taken: Start Time: 10:21 Pain Control: Lidocaine 5% topical ointment T Area Debrided (L x W): otal 1.4 (cm) x 1 (cm) = 1.4 (cm) Tissue and other material debrided: Non-Viable, Slough, Subcutaneous, Skin: Epidermis, Slough Level: Skin/Subcutaneous Tissue Debridement Description: Excisional Instrument: Curette Bleeding: Minimum Hemostasis Achieved: Pressure Response to Treatment: Procedure was tolerated well Level of Consciousness (Post-  Awake and Alert procedure): Post Debridement Measurements of Total Wound Length: (cm) 1.4 Width: (cm) 1 Depth: (cm) 0.1 Volume: (cm) 0.11 Character of Wound/Ulcer Post Debridement: Improved Post Procedure Diagnosis Same as Pre-procedure [name] ([id number]) [id number] Page 2 of 9 Notes scribed for Dr. Heber [place] by Adline Peals, RN Electronic Signature(s) Signed: 02/03/2022 4:24:58 PM By: Adline Peals Signed: 02/06/2022 1:40:10 PM By: Kalman Shan DO Entered By: Adline Peals on 02/03/2022 10:22:42 -------------------------------------------------------------------------------- Debridement Details Patient Name: Date of Service: Keith Nones B. 02/03/2022 9:30 A M Medical Record Number: [id number] Patient Account Number: [id number] Date of Birth/Sex: Treating RN: Mar 30, 1961 (61 y.o. [name] Primary Care Provider: [name], [name] Other Clinician: Referring Provider: Treating Provider/Extender: Jeraline Marcinek [name], [name] Weeks in Treatment: 3 Debridement Performed for Assessment: Wound #2 Right,Distal,Medial Lower Leg Performed By: Physician Kalman Shan, DO Debridement Type: Debridement Level of Consciousness (Pre-procedure): Awake and Alert Pre-procedure Verification/Time Out Yes - 10:21 Taken: Start Time: 10:21 Pain Control: Lidocaine 5% topical ointment T Area Debrided (L x W): otal 1.4 (cm) x 1.8 (cm) = 2.52 (cm) Tissue and other material debrided: Non-Viable, Slough, Subcutaneous, Skin: Epidermis, Slough Level: Skin/Subcutaneous Tissue Debridement Description: Excisional Instrument: Curette Bleeding: Minimum Hemostasis Achieved: Pressure Response to Treatment: Procedure was tolerated well Level of Consciousness (Post- Awake and Alert procedure): Post Debridement Measurements of Total Wound Length: (cm) 1.4 Width: (cm) 1.8 Depth: (cm) 0.1 Volume: (cm) 0.198 Character of Wound/Ulcer  Post Debridement: Improved Post Procedure Diagnosis Same as Pre-procedure Notes scribed for Dr. Heber [place] by Adline Peals, RN Electronic Signature(s) Signed: 02/03/2022 4:24:58 PM By: Adline Peals Signed: 02/06/2022 1:40:10 PM By: Kalman Shan DO Entered By: Adline Peals on 02/03/2022 10:23:14 -------------------------------------------------------------------------------- HPI Details Patient Name: Date of Service: [name], [name] 02/03/2022 9:30 A M Medical Record Number: [id number] Patient Account Number: [id number] Date of Birth/Sex: Treating RN: November 03, 1960 (61 y.o. M) Primary Care Provider: [name], [name] Other Clinician: [name], Mooney ([id number]) [id number] Page 3 of 9 Referring Provider: Treating Provider/Extender: [name] [name], [name] Weeks in Treatment: 3 History of Present Illness HPI  Description: Admission 01/13/2022 [name] is a 61 year old male with a past medical history of hypertension and CKD stage III that presents to the clinic for wounds to his right lower extremity. He states he was helping loads shingles when a [employer] tractor hit his shin causing open wounds. He visited the ED for this issue on 12/11/2021 and was prescribed Bactroban ointment. The following week he followed up and was given Keflex for potential cellulitis. He has been also following with his primary care physician for this issue. After he finished Keflex he has been prescribed doxycycline and is currently taking this. He denies systemic signs of infection currently. For the past 2 weeks he has been keeping the area open to air. He has some chronic mild pain to the wound sites. He denies increased warmth, erythema or purulent drainage. 10/17; patient presents for follow-up. He has been using Medihoney with Hydrofera Blue daily. He reports some mild chronic pain to the wound sites. Overall he reports improvement in his pain  level. He denies any purulent drainage, increased warmth or erythema to the area. 10/31; patient presents for follow-up. We have been using gentamicin and mupirocin ointment with Hydrofera Blue under Kerlix/Coban. Patient reports minimal pain. He denies signs of infection. There is been improvement in wound healing. He has tolerated the compression wraps well. Electronic Signature(s) Signed: 02/06/2022 1:40:10 PM By: Kalman Shan DO Entered By: Kalman Shan on 02/03/2022 10:49:53 -------------------------------------------------------------------------------- Physical Exam Details Patient Name: Date of Service: Clarissa, [name] 02/03/2022 9:30 A M Medical Record Number: [id number] Patient Account Number: [id number] Date of Birth/Sex: Treating RN: [birth year] (61 y.o. M) Primary Care Provider: [name], [name] Other Clinician: Referring Provider: Treating Provider/Extender: Victorine Mcnee [name], [name] Weeks in Treatment: 3 Constitutional respirations regular, non-labored and within target range for patient.. Cardiovascular 2+ dorsalis pedis/posterior tibialis pulses. Psychiatric pleasant and cooperative. Notes Right lower extremity: Multiple open wounds to the anterior aspect with nonviable tissue throughout. No increased warmth, erythema or purulent drainage. Post- debridement there is granulation tissue present. Electronic Signature(s) Signed: 02/06/2022 1:40:10 PM By: Kalman Shan DO Entered By: Kalman Shan on 02/03/2022 10:50:35 -------------------------------------------------------------------------------- Physician Orders Details Patient Name: Date of Service: [name], Keith [place] B. 02/03/2022 9:30 A M Medical Record Number: [id number] Patient Account Number: [id number] Date of Birth/Sex: Treating RN: 1960/09/20 (60 y.o. [name] Primary Care Provider: [name], [name] Other Clinician: Referring Provider: Treating Provider/Extender: Takuma Cifelli,  Sriram Febles [name], [name] Weeks in Treatment: 3 Verbal / Phone Orders: No [name], [name] ([id number]) [id number] Page 4 of 9 Diagnosis Coding Follow-up Appointments ppointment in 2 weeks. - Dr. Heber [place] Return A Nurse Visit: - Tuesday Anesthetic Wound #1 Right,Medial Lower Leg (In clinic) Topical Lidocaine 5% applied to wound bed Wound #2 Right,Distal,Medial Lower Leg (In clinic) Topical Lidocaine 5% applied to wound bed Bathing/ Shower/ Hygiene May shower and wash wound with soap and water. - leave bandage on in shower, remove at end of shower wash wound with dial antibacterial soap or wound cleanser, redress wound after shower Edema Control - Lymphedema / SCD / Other Right Lower Extremity Elevate legs to the level of the heart or above for 30 minutes daily and/or when sitting, a frequency of: Avoid standing for long periods of time. Exercise regularly Moisturize legs daily. Wound Treatment Wound #1 - Lower Leg Wound Laterality: Right, Medial Cleanser: Wound Cleanser 1 x Per Week/15 Days Discharge Instructions: Cleanse the wound with wound cleanser prior to applying a clean dressing using gauze sponges, not tissue or  cotton balls. Topical: Gentamicin 1 x Per Week/15 Days Discharge Instructions: As directed by physician Topical: Mupirocin Ointment 1 x Per Week/15 Days Discharge Instructions: Apply Mupirocin (Bactroban) as instructed Prim Dressing: Hydrofera Blue Classic Foam, 2x2 in (Generic) 1 x Per Week/15 Days ary Discharge Instructions: Moisten with saline prior to applying to wound bed Secondary Dressing: Woven Gauze Sponge, Non-Sterile 4x4 in (Generic) 1 x Per Week/15 Days Discharge Instructions: Apply over primary dressing as directed. Compression Wrap: Kerlix Roll 4.5x3.1 (in/yd) 1 x Per Week/15 Days Discharge Instructions: Apply Kerlix and Coban compression as directed. Compression Wrap: Coban Self-Adherent Wrap 4x5 (in/yd) 1 x Per Week/15  Days Discharge Instructions: Apply over Kerlix as directed. Wound #2 - Lower Leg Wound Laterality: Right, Medial, Distal Cleanser: Wound Cleanser 1 x Per Week/15 Days Discharge Instructions: Cleanse the wound with wound cleanser prior to applying a clean dressing using gauze sponges, not tissue or cotton balls. Topical: Gentamicin 1 x Per Week/15 Days Discharge Instructions: As directed by physician Topical: Mupirocin Ointment 1 x Per Week/15 Days Discharge Instructions: Apply Mupirocin (Bactroban) as instructed Prim Dressing: Hydrofera Blue Classic Foam, 2x2 in (Generic) 1 x Per Week/15 Days ary Discharge Instructions: Moisten with saline prior to applying to wound bed Secondary Dressing: Woven Gauze Sponge, Non-Sterile 4x4 in (Generic) 1 x Per Week/15 Days Discharge Instructions: Apply over primary dressing as directed. Compression Wrap: Kerlix Roll 4.5x3.1 (in/yd) 1 x Per Week/15 Days Discharge Instructions: Apply Kerlix and Coban compression as directed. Compression Wrap: Coban Self-Adherent Wrap 4x5 (in/yd) 1 x Per Week/15 Days Discharge Instructions: Apply over Kerlix as directed. Patient Medications llergies: Shellfish Containing Products, bee sting kit, latex, penicillin, Sulfa (Sulfonamide Antibiotics), Statins-HMG-CoA Reductase Inhibitors A Notifications Medication Indication Start End 02/03/2022 lidocaine DOSE topical 5 % ointment - ointment topical [name], [name] ([id number]) [id number] Page 5 of 9 Electronic Signature(s) Signed: 02/06/2022 1:40:10 PM By: Kalman Shan DO Entered By: Kalman Shan on 02/03/2022 10:50:43 -------------------------------------------------------------------------------- Problem List Details Patient Name: Date of Service: [name], [name] 02/03/2022 9:30 A M Medical Record Number: [id number] Patient Account Number: [id number] Date of Birth/Sex: Treating RN: Apr 06, 1961 (61 y.o. M) Primary Care Provider:  [name], [name] Other Clinician: Referring Provider: Treating Provider/Extender: [name] [name], [name] Weeks in Treatment: 3 Active Problems ICD-10 Encounter Code Description Active Date MDM Diagnosis L97.812 Non-pressure chronic ulcer of other part of right lower leg with fat layer 01/13/2022 No Yes exposed T79.8XXA Other early complications of trauma, initial encounter 01/13/2022 No Yes N18.30 Chronic kidney disease, stage 3 unspecified 01/13/2022 No Yes I87.311 Chronic venous hypertension (idiopathic) with ulcer of right lower extremity 01/20/2022 No Yes Inactive Problems Resolved Problems Electronic Signature(s) Signed: 02/06/2022 1:40:10 PM By: Kalman Shan DO Entered By: Kalman Shan on 02/03/2022 10:48:26 -------------------------------------------------------------------------------- Progress Note Details Patient Name: Date of Service: [name], Keith [place] B. 02/03/2022 9:30 A M Medical Record Number: [id number] Patient Account Number: [id number] Date of Birth/Sex: Treating RN: 1961/01/24 (61 y.o. M) Primary Care Provider: [name], [name] Other Clinician: Referring Provider: Treating Provider/Extender: [name] [name], [name] Weeks in Treatment: [address] [place] ([id number]) [id number] Page 6 of 9 Chief Complaint Information obtained from Patient 01/13/2022; wounds to the right lower extremity secondary to trauma History of Present Illness (HPI) Admission 01/13/2022 Mr. kamilo och is a 61 year old male with a past medical history of hypertension and CKD stage III that presents to the clinic for wounds to his right lower extremity. He states he was helping loads shingles when a [employer] Axon hit his  shin causing open wounds. He visited the ED for this issue on 12/11/2021 and was prescribed Bactroban ointment. The following week he followed up and was given Keflex for potential cellulitis. He has been also  following with his primary care physician for this issue. After he finished Keflex he has been prescribed doxycycline and is currently taking this. He denies systemic signs of infection currently. For the past 2 weeks he has been keeping the area open to air. He has some chronic mild pain to the wound sites. He denies increased warmth, erythema or purulent drainage. 10/17; patient presents for follow-up. He has been using Medihoney with Hydrofera Blue daily. He reports some mild chronic pain to the wound sites. Overall he reports improvement in his pain level. He denies any purulent drainage, increased warmth or erythema to the area. 10/31; patient presents for follow-up. We have been using gentamicin and mupirocin ointment with Hydrofera Blue under Kerlix/Coban. Patient reports minimal pain. He denies signs of infection. There is been improvement in wound healing. He has tolerated the compression wraps well. Patient History Social History Never smoker, Marital Status - Married, Alcohol Use - Moderate, Drug Use - No History, Caffeine Use - Daily. Medical History Cardiovascular Patient has history of Hypertension Medical A Surgical History Notes nd Cardiovascular hypercholesteremia Endocrine hypothyroid Objective Constitutional respirations regular, non-labored and within target range for patient.. Vitals Time Taken: 9:44 AM, Height: 72 in, Weight: 190 lbs, BMI: 25.8, Temperature: 98.1 F, Pulse: 56 bpm, Respiratory Rate: 18 breaths/min, Blood Pressure: 147/82 mmHg. Cardiovascular 2+ dorsalis pedis/posterior tibialis pulses. Psychiatric pleasant and cooperative. General Notes: Right lower extremity: Multiple open wounds to the anterior aspect with nonviable tissue throughout. No increased warmth, erythema or purulent drainage. Post-debridement there is granulation tissue present. Integumentary (Hair, Skin) Wound #1 status is Open. Original cause of wound was Trauma. The date acquired  was: 12/13/2021. The wound has been in treatment 3 weeks. The wound is located on the Right,Medial Lower Leg. The wound measures 1.4cm length x 1cm width x 0.1cm depth; 1.1cm^2 area and 0.11cm^3 volume. There is Fat Layer (Subcutaneous Tissue) exposed. There is no tunneling or undermining noted. There is a medium amount of serosanguineous drainage noted. The wound margin is distinct with the outline attached to the wound base. There is no granulation within the wound bed. There is a large (67-100%) amount of necrotic tissue within the wound bed including Eschar and Adherent Slough. The periwound skin appearance did not exhibit: Callus, Crepitus, Excoriation, Induration, Rash, Scarring, Dry/Scaly, Maceration, Atrophie Blanche, Cyanosis, Ecchymosis, Hemosiderin Staining, Mottled, Pallor, Rubor, Erythema. Periwound temperature was noted as No Abnormality. Wound #2 status is Open. Original cause of wound was Trauma. The date acquired was: 12/13/2021. The wound has been in treatment 3 weeks. The wound is located on the Right,Distal,Medial Lower Leg. The wound measures 1.4cm length x 1.8cm width x 0.1cm depth; 1.979cm^2 area and 0.198cm^3 volume. There is Fat Layer (Subcutaneous Tissue) exposed. There is no tunneling or undermining noted. There is a medium amount of serosanguineous drainage noted. The wound margin is distinct with the outline attached to the wound base. There is no granulation within the wound bed. There is a large (67-100%) amount of necrotic tissue within the wound bed including Eschar and Adherent Slough. The periwound skin appearance did not exhibit: Callus, Crepitus, Excoriation, Induration, Rash, Scarring, Dry/Scaly, Maceration, Atrophie Blanche, Cyanosis, Ecchymosis, Hemosiderin Staining, Mottled, Pallor, Rubor, Erythema. Periwound temperature was noted as No Abnormality. Assessment Active Problems ICD-10 [name], [name] ([id number]) [id number]  Page 7 of  9 Non-pressure chronic ulcer of other part of right lower leg with fat layer exposed Other early complications of trauma, initial encounter Chronic kidney disease, stage 3 unspecified Chronic venous hypertension (idiopathic) with ulcer of right lower extremity Patient's wounds appear well-healing. I debrided nonviable tissue. I recommended continuing the course with antibiotic ointment and Hydrofera Blue under compression therapy. Follow-up in 1 week. Procedures Wound #1 Pre-procedure diagnosis of Wound #1 is a Trauma, Other located on the Right,Medial Lower Leg . There was a Excisional Skin/Subcutaneous Tissue Debridement with a total area of 1.4 sq cm performed by Kalman Shan, DO. With the following instrument(s): Curette to remove Non-Viable tissue/material. Material removed includes Subcutaneous Tissue, Slough, and Skin: Epidermis after achieving pain control using Lidocaine 5% topical ointment. No specimens were taken. A time out was conducted at 10:21, prior to the start of the procedure. A Minimum amount of bleeding was controlled with Pressure. The procedure was tolerated well. Post Debridement Measurements: 1.4cm length x 1cm width x 0.1cm depth; 0.11cm^3 volume. Character of Wound/Ulcer Post Debridement is improved. Post procedure Diagnosis Wound #1: Same as Pre-Procedure General Notes: scribed for Dr. Heber [place] by Adline Peals, RN. Wound #2 Pre-procedure diagnosis of Wound #2 is a Trauma, Other located on the Right,Distal,Medial Lower Leg . There was a Excisional Skin/Subcutaneous Tissue Debridement with a total area of 2.52 sq cm performed by Kalman Shan, DO. With the following instrument(s): Curette to remove Non-Viable tissue/material. Material removed includes Subcutaneous Tissue, Slough, and Skin: Epidermis after achieving pain control using Lidocaine 5% topical ointment. No specimens were taken. A time out was conducted at 10:21, prior to the start of the procedure.  A Minimum amount of bleeding was controlled with Pressure. The procedure was tolerated well. Post Debridement Measurements: 1.4cm length x 1.8cm width x 0.1cm depth; 0.198cm^3 volume. Character of Wound/Ulcer Post Debridement is improved. Post procedure Diagnosis Wound #2: Same as Pre-Procedure General Notes: scribed for Dr. Heber [place] by Adline Peals, RN. Plan Follow-up Appointments: Return Appointment in 2 weeks. - Dr. Heber [place] Nurse Visit: - Tuesday Anesthetic: Wound #1 Right,Medial Lower Leg: (In clinic) Topical Lidocaine 5% applied to wound bed Wound #2 Right,Distal,Medial Lower Leg: (In clinic) Topical Lidocaine 5% applied to wound bed Bathing/ Shower/ Hygiene: May shower and wash wound with soap and water. - leave bandage on in shower, remove at end of shower wash wound with dial antibacterial soap or wound cleanser, redress wound after shower Edema Control - Lymphedema / SCD / Other: Elevate legs to the level of the heart or above for 30 minutes daily and/or when sitting, a frequency of: Avoid standing for long periods of time. Exercise regularly Moisturize legs daily. The following medication(s) was prescribed: lidocaine topical 5 % ointment ointment topical was prescribed at facility WOUND #1: - Lower Leg Wound Laterality: Right, Medial Cleanser: Wound Cleanser 1 x Per Week/15 Days Discharge Instructions: Cleanse the wound with wound cleanser prior to applying a clean dressing using gauze sponges, not tissue or cotton balls. Topical: Gentamicin 1 x Per Week/15 Days Discharge Instructions: As directed by physician Topical: Mupirocin Ointment 1 x Per Week/15 Days Discharge Instructions: Apply Mupirocin (Bactroban) as instructed Prim Dressing: Hydrofera Blue Classic Foam, 2x2 in (Generic) 1 x Per Week/15 Days ary Discharge Instructions: Moisten with saline prior to applying to wound bed Secondary Dressing: Woven Gauze Sponge, Non-Sterile 4x4 in (Generic) 1 x Per Week/15  Days Discharge Instructions: Apply over primary dressing as directed. Com pression Wrap: Kerlix Roll 4.5x3.1 (in/yd) 1  x Per Week/15 Days Discharge Instructions: Apply Kerlix and Coban compression as directed. Com pression Wrap: Coban Self-Adherent Wrap 4x5 (in/yd) 1 x Per Week/15 Days Discharge Instructions: Apply over Kerlix as directed. WOUND #2: - Lower Leg Wound Laterality: Right, Medial, Distal Cleanser: Wound Cleanser 1 x Per Week/15 Days Discharge Instructions: Cleanse the wound with wound cleanser prior to applying a clean dressing using gauze sponges, not tissue or cotton balls. Topical: Gentamicin 1 x Per Week/15 Days Discharge Instructions: As directed by physician Topical: Mupirocin Ointment 1 x Per Week/15 Days Discharge Instructions: Apply Mupirocin (Bactroban) as instructed Prim Dressing: Hydrofera Blue Classic Foam, 2x2 in (Generic) 1 x Per Week/15 Days ary Discharge Instructions: Moisten with saline prior to applying to wound bed Secondary Dressing: Woven Gauze Sponge, Non-Sterile 4x4 in (Generic) 1 x Per Week/15 Days Discharge Instructions: Apply over primary dressing as directed. Com pression Wrap: Kerlix Roll 4.5x3.1 (in/yd) 1 x Per Week/15 Days Discharge Instructions: Apply Kerlix and Coban compression as directed. Com pression Wrap: Coban Self-Adherent Wrap 4x5 (in/yd) 1 x Per Week/15 Days Discharge Instructions: Apply over Kerlix as directed. ABOUBACAR, MATSUO ([id number]) [id number] Page 8 of 9 1. In office sharp debridement 2. Hydrofera Blue and antibiotic ointment under Kerlix/Coban 3. Follow-up in 1 week Electronic Signature(s) Signed: 02/06/2022 1:40:10 PM By: Kalman Shan DO Entered By: Kalman Shan on 02/03/2022 10:52:55 -------------------------------------------------------------------------------- HxROS Details Patient Name: Date of Service: [place] B. 02/03/2022 9:30 A M Medical Record Number:  [id number] Patient Account Number: [id number] Date of Birth/Sex: Treating RN: 08-13-1960 (61 y.o. M) Primary Care Provider: [name], [name] Other Clinician: Referring Provider: Treating Provider/Extender: Governor Matos [name], [name] Weeks in Treatment: 3 Cardiovascular Medical History: Positive for: Hypertension Past Medical History Notes: hypercholesteremia Endocrine Medical History: Past Medical History Notes: hypothyroid Immunizations Pneumococcal Vaccine: Received Pneumococcal Vaccination: No Implantable Devices None Family and Social History Never smoker; Marital Status - Married; Alcohol Use: Moderate; Drug Use: No History; Caffeine Use: Daily; Financial Concerns: No; Food, Clothing or Shelter Needs: No; Support System Lacking: No; Transportation Concerns: No Electronic Signature(s) Signed: 02/06/2022 1:40:10 PM By: Kalman Shan DO Entered By: Kalman Shan on 02/03/2022 10:49:58 -------------------------------------------------------------------------------- SuperBill Details Patient Name: Date of Service: Keith Nones B. 02/03/2022 Medical Record Number: [id number] Patient Account Number: [id number] Date of Birth/Sex: Treating RN: [birth year] (62 y.o. M) Primary Care Provider: [name], [name] Other Clinician: Referring Provider: Treating Provider/Extender: Shardea Cwynar [name], [name] Weeks in Treatment: [address] KACPER, CARTLIDGE ([id number]) [id number] Page 9 of 9 Diagnosis Coding ICD-10 Codes Code Description [phone] Non-pressure chronic ulcer of other part of right lower leg with fat layer exposed T79.8XXA Other early complications of trauma, initial encounter N18.30 Chronic kidney disease, stage 3 unspecified I87.311 Chronic venous hypertension (idiopathic) with ulcer of right lower extremity Facility Procedures : CPT4 Code: [id number] Description: 79038 - DEB SUBQ TISSUE 20 SQ CM/< ICD-10 Diagnosis Description B33.832  Non-pressure chronic ulcer of other part of right lower leg with fat layer exp Modifier: osed Quantity: 1 Physician Procedures : CPT4 Code Description Modifier [id number] 60045 - WC PHYS SUBQ TISS 20 SQ CM ICD-10 Diagnosis Description T97.741 Non-pressure chronic ulcer of other part of right lower leg with fat layer exposed Quantity: 1 Electronic Signature(s) Signed: 02/06/2022 1:40:10 PM By: Kalman Shan DO Entered By: Kalman Shan on 02/03/2022 10:54:45

## 2022-02-10 ENCOUNTER — Encounter (HOSPITAL_BASED_OUTPATIENT_CLINIC_OR_DEPARTMENT_OTHER): Payer: Commercial Managed Care - PPO | Attending: General Surgery | Admitting: General Surgery

## 2022-02-10 DIAGNOSIS — L97812 Non-pressure chronic ulcer of other part of right lower leg with fat layer exposed: Secondary | ICD-10-CM | POA: Insufficient documentation

## 2022-02-10 DIAGNOSIS — N183 Chronic kidney disease, stage 3 unspecified: Secondary | ICD-10-CM | POA: Insufficient documentation

## 2022-02-10 DIAGNOSIS — E7800 Pure hypercholesterolemia, unspecified: Secondary | ICD-10-CM | POA: Diagnosis not present

## 2022-02-10 DIAGNOSIS — G8929 Other chronic pain: Secondary | ICD-10-CM | POA: Insufficient documentation

## 2022-02-10 DIAGNOSIS — E039 Hypothyroidism, unspecified: Secondary | ICD-10-CM | POA: Diagnosis not present

## 2022-02-10 DIAGNOSIS — I87311 Chronic venous hypertension (idiopathic) with ulcer of right lower extremity: Secondary | ICD-10-CM | POA: Diagnosis not present

## 2022-02-10 DIAGNOSIS — T798XXA Other early complications of trauma, initial encounter: Secondary | ICD-10-CM | POA: Insufficient documentation

## 2022-02-10 DIAGNOSIS — X58XXXA Exposure to other specified factors, initial encounter: Secondary | ICD-10-CM | POA: Diagnosis not present

## 2022-02-10 DIAGNOSIS — I129 Hypertensive chronic kidney disease with stage 1 through stage 4 chronic kidney disease, or unspecified chronic kidney disease: Secondary | ICD-10-CM | POA: Insufficient documentation

## 2022-02-11 NOTE — Progress Notes (Signed)
[name], [name] ([id number]) [id number] Page 1 of 6 Visit Report for 02/10/2022 Arrival Information Details Patient Name: Date of Service: [name], [name] 02/10/2022 8:15 A M Medical Record Number: [id number] Patient Account Number: [id number] Date of Birth/Sex: Treating RN: [name] (61 y.o. M) Primary Care [name]: [name] Other Clinician: Referring [name]: Treating [name]/Extender: [name] [name] Weeks in Treatment: 4 Visit Information History Since Last Visit All ordered tests and consults were completed: No Patient Arrived: Ambulatory Added or deleted any medications: No Arrival Time: 08:29 Any new allergies or adverse reactions: No Accompanied By: self Had a fall or experienced change in No Transfer Assistance: None activities of daily living that may affect Patient Identification Verified: Yes risk of falls: Secondary Verification Process Completed: Yes Signs or symptoms of abuse/neglect since last visito No Patient Requires Transmission-Based Precautions: No Hospitalized since last visit: No Patient Has Alerts: No Implantable device outside of the clinic excluding No cellular tissue based products placed in the center since last visit: Pain Present Now: No Electronic Signature(s) Signed: 02/10/2022 9:00:08 AM By: [name] Entered By: [name] on 02/10/2022 08:30:04 -------------------------------------------------------------------------------- Clinic Level of Care Assessment Details Patient Name: Date of Service: [name], [name] 02/10/2022 8:15 A M Medical Record Number: [id number] Patient Account Number: [id number] Date of Birth/Sex: Treating RN: [name] (61 y.o. [name] Primary Care Keefer Soulliere: [name] Other Clinician: Referring Oran Dillenburg: Treating Mele Sylvester/Extender: [name] [name] Weeks in Treatment: 4 Clinic Level of Care Assessment Items TOOL 4 Quantity  Score X- 1 0 Use when only an EandM is performed on FOLLOW-UP visit ASSESSMENTS - Nursing Assessment / Reassessment X- 1 10 Reassessment of Co-morbidities (includes updates in patient status) X- 1 5 Reassessment of Adherence to Treatment Plan ASSESSMENTS - Wound and Skin A ssessment / Reassessment []  - 0 Simple Wound Assessment / Reassessment - one wound X- 2 5 Complex Wound Assessment / Reassessment - multiple wounds X- 1 10 Dermatologic / Skin Assessment (not related to wound area) ASSESSMENTS - Focused Assessment []  - 0 Circumferential Edema Measurements - multi extremities []  - 0 Nutritional Assessment / Counseling / Intervention [name], [name] ( ) [id number] Page 2 of 6 []  - 0 Lower Extremity Assessment (monofilament, tuning fork, pulses) []  - 0 Peripheral Arterial Disease Assessment (using hand held doppler) ASSESSMENTS - Ostomy and/or Continence Assessment and Care []  - 0 Incontinence Assessment and Management []  - 0 Ostomy Care Assessment and Management (repouching, etc.) PROCESS - Coordination of Care []  - 0 Simple Patient / Family Education for ongoing care X- 1 20 Complex (extensive) Patient / Family Education for ongoing care X- 1 10 Staff obtains [name], Records, T Results / Process Orders est []  - 0 Staff telephones HHA, Nursing Homes / Clarify orders / etc []  - 0 Routine Transfer to another Facility (non-emergent condition) []  - 0 Routine Hospital Admission (non-emergent condition) []  - 0 New Admissions / [id number] / Ordering NPWT Apligraf, etc. , []  - 0 Emergency Hospital Admission (emergent condition) []  - 0 Simple Discharge Coordination X- 1 15 Complex (extensive) Discharge Coordination PROCESS - Special Needs []  - 0 Pediatric / Minor Patient Management []  - 0 Isolation Patient Management []  - 0 Hearing / Language / Visual special needs []  - 0 Assessment of Community assistance  (transportation, D/C planning, etc.) []  - 0 Additional assistance / Altered mentation []  - 0 Support Surface(s) Assessment (bed, cushion, seat, etc.) INTERVENTIONS - Wound Cleansing / Measurement []  - 0 Simple Wound Cleansing - one  wound X- 2 5 Complex Wound Cleansing - multiple wounds X- 1 5 Wound Imaging (photographs - any number of wounds) []  - 0 Wound Tracing (instead of photographs) []  - 0 Simple Wound Measurement - one wound X- 2 5 Complex Wound Measurement - multiple wounds INTERVENTIONS - Wound Dressings []  - 0 Small Wound Dressing one or multiple wounds []  - 0 Medium Wound Dressing one or multiple wounds X- 1 20 Large Wound Dressing one or multiple wounds []  - 0 Application of Medications - topical []  - 0 Application of Medications - injection INTERVENTIONS - Miscellaneous []  - 0 External ear exam []  - 0 Specimen Collection (cultures, biopsies, blood, body fluids, etc.) []  - 0 Specimen(s) / Culture(s) sent or taken to Lab for analysis []  - 0 Patient Transfer (multiple staff / / Similar devices) []  - 0 Simple Staple / Suture removal (25 or less) []  - 0 Complex Staple / Suture removal (26 or more) []  - 0 Hypo / Hyperglycemic Management (close monitor of Blood Glucose) [name], [name] ( ) [id number] Page 3 of 6 []  - 0 Ankle / Brachial Index (ABI) - do not check if billed separately X- 1 5 Vital Signs Has the patient been seen at the hospital within the last three years: Yes Total Score: 130 Level Of Care: New/Established - Level 4 Electronic Signature(s) Signed: 02/10/2022 5:13:10 PM By: RN, BSN Entered By: on 02/10/2022 08:37:40 -------------------------------------------------------------------------------- Encounter Discharge Information Details Patient Name: Date of Service: Mooney. 02/10/2022 8:15 A M Medical Record Number: Patient Account Number:  Nurse, adult Date of Birth/Sex: Treating RN: Keith 12, Mooney (60 y.o. Primary Care Dusten Ellinwood: [id number], Mooney Other Clinician: Referring Violet Cart: Treating Dearius Hoffmann/Extender: [name] 08-20-1980, Mooney Weeks in Treatment: 4 Encounter Discharge Information Items Discharge Condition: Stable Ambulatory Status: Ambulatory Discharge Destination: Home Transportation: Private Auto Accompanied By: self Schedule Follow-up Appointment: Yes Clinical Summary of Care: Electronic Signature(s) Signed: 02/10/2022 5:13:10 PM By: 13/10/2021 RN, BSN Entered By: [name] on 02/10/2022 08:37:11 -------------------------------------------------------------------------------- Wound Assessment Details Patient Name: Date of Service: 13/10/2021 Mooney. 02/10/2022 8:15 A M Medical Record Number: 13/10/2021 Patient Account Number: [id number] Date of Birth/Sex: Treating RN: [birth year] (61 y.o. 68 Primary Care Caliyah Sieh: [name], Mooney Other Clinician: Referring Mavryk Pino: Treating Redding Cloe/Extender: [name] [name] Weeks in Treatment: 4 Wound Status Wound Number: 1 Primary Etiology: Trauma, Other Wound Location: Right, Medial Lower Leg Wound Status: Open Wounding Event: Trauma Date Acquired: 12/13/2021 Weeks Of Treatment: 4 Clustered Wound: No Wound Measurements Length: (cm) 1.4 Width: (cm) 1 Depth: (cm) 0.1 Area: (cm) 1.1 Volume: (cm) 0.11 % Reduction in Area: -55.6% % Reduction in Volume: -54.9% Wound Description Jagodzinski, [name] ([name]) Classification: Full Thickness Without Exposed Support Structures Exudate Amount: Medium Exudate Type: Serosanguineous Exudate Color: red, brown [id number] Page 4 of 6 Periwound Skin Texture Texture Color No Abnormalities Noted: No No Abnormalities Noted: No Moisture No Abnormalities Noted: No Treatment Notes Wound #1 (Lower Leg) Wound Laterality: Right, Medial Cleanser Wound  Cleanser Discharge Instruction: Cleanse the wound with wound cleanser prior to applying a clean dressing using gauze sponges, not tissue or cotton balls. Peri-Wound Care Topical Gentamicin Discharge Instruction: As directed by physician Mupirocin Ointment Discharge Instruction: Apply Mupirocin (Bactroban) as instructed Primary Dressing Hydrofera Blue Classic Foam, 2x2 in Discharge Instruction: Moisten with saline prior to applying to wound bed Secondary Dressing Woven Gauze Sponge, Non-Sterile 4x4 in Discharge Instruction: Apply over primary dressing as directed. Secured  With Compression Wrap Kerlix Roll 4.5x3.1 (in/yd) Discharge Instruction: Apply Kerlix and Coban compression as directed. Coban Self-Adherent Wrap 4x5 (in/yd) Discharge Instruction: Apply over Kerlix as directed. Compression Stockings Add-Ons Electronic Signature(s) Signed: 02/10/2022 5:13:10 PM By: Keith Stall RN, BSN Entered By: [name] on 02/10/2022 08:36:47 -------------------------------------------------------------------------------- Wound Assessment Details Patient Name: Date of Service: [place], [name]. 02/10/2022 8:15 A M Medical Record Number: [id number] Patient Account Number: [id number] Date of Birth/Sex: Treating RN: [birth year], Mooney (61 y.o. [name] Primary Care Jaelynn Currier: [name] Other Clinician: Referring Brevan Luberto: Treating Anielle Headrick/Extender: [name] [name] Weeks in Treatment: 4 Wound Status Wound Number: 2 Primary Etiology: Trauma, Other Wound Location: Right, Distal, Medial Lower Leg Wound Status: Open Wounding Event: Trauma Date Acquired: 12/13/2021 [name], [name] ([id number]) [id number] Page 5 of 6 Weeks Of Treatment: 4 Clustered Wound: Yes Wound Measurements Length: (cm) 1.4 Width: (cm) 1.8 Depth: (cm) 0.1 Area: (cm) 1.979 Volume: (cm) 0.198 % Reduction in Area: 10% % Reduction in Volume: 10% Wound  Description Classification: Full Thickness Without Exposed Suppor Exudate Amount: Medium Exudate Type: Serosanguineous Exudate Color: red, brown t Structures Periwound Skin Texture Texture Color No Abnormalities Noted: No No Abnormalities Noted: No Moisture No Abnormalities Noted: No Treatment Notes Wound #2 (Lower Leg) Wound Laterality: Right, Medial, Distal Cleanser Wound Cleanser Discharge Instruction: Cleanse the wound with wound cleanser prior to applying a clean dressing using gauze sponges, not tissue or cotton balls. Peri-Wound Care Topical Gentamicin Discharge Instruction: As directed by physician Mupirocin Ointment Discharge Instruction: Apply Mupirocin (Bactroban) as instructed Primary Dressing Hydrofera Blue Classic Foam, 2x2 in Discharge Instruction: Moisten with saline prior to applying to wound bed Secondary Dressing Woven Gauze Sponge, Non-Sterile 4x4 in Discharge Instruction: Apply over primary dressing as directed. Secured With Compression Wrap Kerlix Roll 4.5x3.1 (in/yd) Discharge Instruction: Apply Kerlix and Coban compression as directed. Coban Self-Adherent Wrap 4x5 (in/yd) Discharge Instruction: Apply over Kerlix as directed. Compression Stockings Add-Ons Electronic Signature(s) Signed: 02/10/2022 5:13:10 PM By: Keith Stall RN, BSN Entered By: [name] on 02/10/2022 08:36:47 -------------------------------------------------------------------------------- Vitals Details Patient Name: Date of Service: [name], [name]. 02/10/2022 8:15 A [name], [name] ([id number]) [id number] Page 6 of 6 Medical Record Number: [id number] Patient Account Number: [id number] Date of Birth/Sex: Treating RN: [birth year]/Mooney (61 y.o. M) Primary Care [place] Liz: [name] Other Clinician: Referring Jesiah Grismer: Treating Retina Bernardy/Extender: [name] [name] Weeks in Treatment: 4 Vital Signs Time Taken: 08:30 Temperature  (F): 98.5 Height (in): 72 Pulse (bpm): 76 Weight (lbs): 190 Respiratory Rate (breaths/min): 20 Body Mass Index (BMI): 25.8 Blood Pressure (mmHg): 150/93 Reference Range: 80 - 120 mg / dl Electronic Signature(s) Signed: 02/10/2022 9:00:08 AM By: [name] Entered By: [name] on 02/10/2022 08:30:33

## 2022-02-11 NOTE — Progress Notes (Signed)
ARSAL, TAPPAN ([id number]) [id number] Page 1 of 1 Visit Report for 02/10/2022 SuperBill Details Patient Name: Date of Service: [place], [place] 02/10/2022 Medical Record Number: [id number] Patient Account Number: [id number] Date of Birth/Sex: Treating RN: [birth year] (61 y.o. [name] Primary Care Provider: [place], [name] Other Clinician: Referring Provider: Treating Provider/Extender: Draylon Mercadel [place], [name] Weeks in Treatment: 4 Diagnosis Coding ICD-10 Codes Code Description [phone] Non-pressure chronic ulcer of other part of right lower leg with fat layer exposed T79.8XXA Other early complications of trauma, initial encounter N18.30 Chronic kidney disease, stage 3 unspecified I87.311 Chronic venous hypertension (idiopathic) with ulcer of right lower extremity Facility Procedures CPT4 Code Description Modifier Quantity [id number] - WOUND CARE VISIT-LEV 4 EST PT 1 Electronic Signature(s) Signed: 02/10/2022 10:18:06 AM By: Duanne Guess MD FACS Signed: 02/10/2022 5:13:10 PM By: Shawn Stall RN, BSN Entered By: Shawn Stall on 02/10/2022 09:20:52

## 2022-02-17 ENCOUNTER — Encounter (HOSPITAL_BASED_OUTPATIENT_CLINIC_OR_DEPARTMENT_OTHER): Payer: Commercial Managed Care - PPO | Admitting: Internal Medicine

## 2022-02-17 DIAGNOSIS — N183 Chronic kidney disease, stage 3 unspecified: Secondary | ICD-10-CM | POA: Diagnosis not present

## 2022-02-17 DIAGNOSIS — T798XXA Other early complications of trauma, initial encounter: Secondary | ICD-10-CM

## 2022-02-17 DIAGNOSIS — I87311 Chronic venous hypertension (idiopathic) with ulcer of right lower extremity: Secondary | ICD-10-CM | POA: Diagnosis not present

## 2022-02-17 DIAGNOSIS — L97812 Non-pressure chronic ulcer of other part of right lower leg with fat layer exposed: Secondary | ICD-10-CM

## 2022-02-17 NOTE — Progress Notes (Signed)
[name], [name] ([id number]) [id number] Page 1 of 6 Visit Report for 02/17/2022 Chief Complaint Document Details Patient Name: Date of Service: [name], Keith LTER Mooney. 02/17/2022 9:00 A M Medical Record Number: [id number] Patient Account Number: [id number] Date of Birth/Sex: Treating RN: 12-Jul-1960 (61 y.o. M) Primary Care Provider: [place], Mooney Other Clinician: Referring Provider: Treating Provider/Extender: [name] Weeks in Treatment: 5 Information Obtained from: Patient Chief Complaint 01/13/2022; wounds to the right lower extremity secondary to trauma Electronic Signature(s) Signed: 02/17/2022 2:06:49 PM By: Geralyn CorwinHoffman, Nirel Babler DO Entered By: Geralyn CorwinHoffman, Jayce Boyko on 02/17/2022 11:49:47 -------------------------------------------------------------------------------- HPI Details Patient Name: Date of Service: [name], Keith DingwallWA LTER Mooney. 02/17/2022 9:00 A M Medical Record Number: [id number] Patient Account Number: [id number] Date of Birth/Sex: Treating RN: 12-Jul-1960 (61 y.o. M) Primary Care Provider: [place], Mooney Other Clinician: Referring Provider: Treating Provider/Extender: [name] Weeks in Treatment: 5 History of Present Illness HPI Description: Admission 01/13/2022 [name] is a 61 year old male with a past medical history of hypertension and CKD stage III that presents to the clinic for wounds to his right lower extremity. He states he was helping loads shingles when a [name] tractor hit his shin causing open wounds. He visited the ED for this issue on 12/11/2021 and was prescribed Bactroban ointment. The following week he followed up and was given Keflex for potential cellulitis. He has been also following with his primary care physician for this issue. After he finished Keflex he has been prescribed doxycycline and is currently taking this. He denies systemic signs of infection currently. For the past 2  weeks he has been keeping the area open to air. He has some chronic mild pain to the wound sites. He denies increased warmth, erythema or purulent drainage. 10/17; patient presents for follow-up. He has been using Medihoney with Hydrofera Blue daily. He reports some mild chronic pain to the wound sites. Overall he reports improvement in his pain level. He denies any purulent drainage, increased warmth or erythema to the area. 10/31; patient presents for follow-up. We have been using gentamicin and mupirocin ointment with Hydrofera Blue under Kerlix/Coban. Patient reports minimal pain. He denies signs of infection. There is been improvement in wound healing. He has tolerated the compression wraps well. 11/14; patient presents for follow-up. Patient has been using antibiotic ointment with Hydrofera Blue under Kerlix/Coban. His wound is healed. Electronic Signature(s) Signed: 02/17/2022 2:06:49 PM By: Geralyn CorwinHoffman, Diondre Pulis DO Entered By: Geralyn CorwinHoffman, Tenee Wish on 02/17/2022 11:50:30 [name] ([id number]) [id number] Page 2 of 6 -------------------------------------------------------------------------------- Physical Exam Details Patient Name: Date of Service: [name], Keith LTER Mooney. 02/17/2022 9:00 A M Medical Record Number: [id number] Patient Account Number: [id number] Date of Birth/Sex: Treating RN: 12-Jul-1960 (61 y.o. M) Primary Care Provider: [place], Mooney Other Clinician: Referring Provider: Treating Provider/Extender: [name] Weeks in Treatment: 5 Constitutional respirations regular, non-labored and within target range for patient.. Cardiovascular 2+ dorsalis pedis/posterior tibialis pulses. Psychiatric pleasant and cooperative. Notes Right lower extremity: Epithelization to the previous wound site. Some minimal bruising noted to the anterior aspect. No open wounds. Good edema control. Electronic Signature(s) Signed: 02/17/2022 2:06:49 PM  By: Geralyn CorwinHoffman, Demarius Archila DO Entered By: Geralyn CorwinHoffman, Itzelle Gains on 02/17/2022 11:51:07 -------------------------------------------------------------------------------- Physician Orders Details Patient Name: Date of Service: [name], Keith DingwallWA LTER Mooney. 02/17/2022 9:00 A M Medical Record Number: [id number] Patient Account Number: [id number] Date of Birth/Sex: Treating RN: 12-Jul-1960 (61 y.o. Tammy SoursM) Deaton, Bobbi Primary Care Provider: [place], Mooney Other Clinician: Referring Provider: Treating Provider/Extender: Shell Yandow [place], Mooney Weeks in  Treatment: 5 Verbal / Phone Orders: No Diagnosis Coding ICD-10 Coding Code Description L97.812 Non-pressure chronic ulcer of other part of right lower leg with fat layer exposed T79.8XXA Other early complications of trauma, initial encounter N18.30 Chronic kidney disease, stage 3 unspecified I87.311 Chronic venous hypertension (idiopathic) with ulcer of right lower extremity Discharge From [facility] Discharge from Wound Care Center - apply tubigrip size D apply in the morning and remove at night over the next week to aid in swelling. AandD as needed for the irritation areas. Electronic Signature(s) Signed: 02/17/2022 2:06:49 PM By: Geralyn Corwin DO Entered By: Geralyn Corwin on 02/17/2022 11:51:13 Mcelveen, [name] ([id number]) [id number] Page 3 of 6 -------------------------------------------------------------------------------- Problem List Details Patient Name: Date of Service: [name], [name] 02/17/2022 9:00 A M Medical Record Number: [id number] Patient Account Number: [id number] Date of Birth/Sex: Treating RN: 02/02/1961 (61 y.o. [name] Primary Care Provider: [place], Mooney Other Clinician: Referring Provider: Treating Provider/Extender: Liesel Peckenpaugh [place], Mooney Weeks in Treatment: 5 Active Problems ICD-10 Encounter Code Description Active Date MDM Diagnosis L97.812 Non-pressure chronic ulcer  of other part of right lower leg with fat layer 01/13/2022 No Yes exposed T79.8XXA Other early complications of trauma, initial encounter 01/13/2022 No Yes N18.30 Chronic kidney disease, stage 3 unspecified 01/13/2022 No Yes I87.311 Chronic venous hypertension (idiopathic) with ulcer of right lower extremity 01/20/2022 No Yes Inactive Problems Resolved Problems Electronic Signature(s) Signed: 02/17/2022 2:06:49 PM By: Geralyn Corwin DO Entered By: Geralyn Corwin on 02/17/2022 11:49:22 -------------------------------------------------------------------------------- Progress Note Details Patient Name: Date of Service: [name], Keith Dingwall LTER Mooney. 02/17/2022 9:00 A M Medical Record Number: [id number] Patient Account Number: [id number] Date of Birth/Sex: Treating RN: [birth year] (61 y.o. M) Primary Care Provider: [place], Mooney Other Clinician: Referring Provider: Treating Provider/Extender: Luisangel Wainright [place], Mooney Weeks in Treatment: 5 Subjective Chief Complaint Information obtained from Patient 01/13/2022; wounds to the right lower extremity secondary to trauma History of Present Illness (HPI) Admission 01/13/2022 Mr. danil wedge is a 61 year old male with a past medical history of hypertension and CKD stage III that presents to the clinic for wounds to his right lower extremity. He states he was helping loads shingles when a Keith Loffler tractor hit his shin causing open wounds. He visited the ED for this issue on 12/11/2021 and was prescribed Bactroban ointment. The following week he followed up and was given Keflex for potential cellulitis. He has been also following with his KEKOA, FYOCK ([id number]) [id number] Page 4 of 6 primary care physician for this issue. After he finished Keflex he has been prescribed doxycycline and is currently taking this. He denies systemic signs of infection currently. For the past 2 weeks he has been keeping the area open to  air. He has some chronic mild pain to the wound sites. He denies increased warmth, erythema or purulent drainage. 10/17; patient presents for follow-up. He has been using Medihoney with Hydrofera Blue daily. He reports some mild chronic pain to the wound sites. Overall he reports improvement in his pain level. He denies any purulent drainage, increased warmth or erythema to the area. 10/31; patient presents for follow-up. We have been using gentamicin and mupirocin ointment with Hydrofera Blue under Kerlix/Coban. Patient reports minimal pain. He denies signs of infection. There is been improvement in wound healing. He has tolerated the compression wraps well. 11/14; patient presents for follow-up. Patient has been using antibiotic ointment with Hydrofera Blue under Kerlix/Coban. His wound is healed. Patient History Social History Never smoker, Marital Status - Married,  Alcohol Use - Moderate, Drug Use - No History, Caffeine Use - Daily. Medical History Cardiovascular Patient has history of Hypertension Medical A Surgical History Notes nd Cardiovascular hypercholesteremia Endocrine hypothyroid Objective Constitutional respirations regular, non-labored and within target range for patient.. Vitals Time Taken: 9:10 AM, Height: 72 in, Weight: 190 lbs, BMI: 25.8, Temperature: 97.8 F, Pulse: 64 bpm, Respiratory Rate: 18 breaths/min, Blood Pressure: 133/87 mmHg. Cardiovascular 2+ dorsalis pedis/posterior tibialis pulses. Psychiatric pleasant and cooperative. General Notes: Right lower extremity: Epithelization to the previous wound site. Some minimal bruising noted to the anterior aspect. No open wounds. Good edema control. Integumentary (Hair, Skin) Wound #1 status is Healed - Epithelialized. Original cause of wound was Trauma. The date acquired was: 12/13/2021. The wound has been in treatment 5 weeks. The wound is located on the Right,Medial Lower Leg. The wound measures 0cm length x 0cm  width x 0cm depth; 0cm^2 area and 0cm^3 volume. There is no tunneling or undermining noted. There is a medium amount of serosanguineous drainage noted. There is no granulation within the wound bed. There is a large (67-100%) amount of necrotic tissue within the wound bed. The periwound skin appearance exhibited: Ecchymosis. The periwound skin appearance did not exhibit: Callus, Crepitus, Excoriation, Induration, Rash, Scarring, Dry/Scaly, Maceration, Atrophie Blanche, Cyanosis, Hemosiderin Staining, Mottled, Pallor, Rubor, Erythema. Wound #2 status is Healed - Epithelialized. Original cause of wound was Trauma. The date acquired was: 12/13/2021. The wound has been in treatment 5 weeks. The wound is located on the Right,Distal,Medial Lower Leg. The wound measures 0cm length x 0cm width x 0cm depth; 0cm^2 area and 0cm^3 volume. There is no tunneling or undermining noted. There is a medium amount of serosanguineous drainage noted. There is no granulation within the wound bed. There is a large (67-100%) amount of necrotic tissue within the wound bed. The periwound skin appearance exhibited: Ecchymosis. The periwound skin appearance did not exhibit: Callus, Crepitus, Excoriation, Induration, Rash, Scarring, Dry/Scaly, Maceration, Atrophie Blanche, Cyanosis, Hemosiderin Staining, Mottled, Pallor, Rubor, Erythema. Assessment Active Problems ICD-10 Non-pressure chronic ulcer of other part of right lower leg with fat layer exposed Other early complications of trauma, initial encounter Chronic kidney disease, stage 3 unspecified Chronic venous hypertension (idiopathic) with ulcer of right lower extremity Patient has done well with Hydrofera Blue and antibiotic ointment under Kerlix/Coban. His wound has healed. He does not own compression stockings. [name], [name] ([id number]) [id number] Page 5 of 6 not think he needs this as this was a trauma wound. I did recommend Tubigrip  daily for the next 1-2 weeks and this was given in office today. He may follow- up as needed. Plan Discharge From [facility]: Discharge from Wound Care Center - apply tubigrip size D apply in the morning and remove at night over the next week to aid in swelling. AandD as needed for the irritation areas. 1. Discharge from clinic due to closed wound 2. Follow-up as needed 3. Tubigrip daily for the next 1 to 2 weeks Electronic Signature(s) Signed: 02/17/2022 2:06:49 PM By: Geralyn Corwin DO Entered By: Geralyn Corwin on 02/17/2022 11:53:08 -------------------------------------------------------------------------------- HxROS Details Patient Name: Date of Service: [name], Keith LTER Mooney. 02/17/2022 9:00 A M Medical Record Number: [id number] Patient Account Number: [id number] Date of Birth/Sex: Treating RN: [birth year] (61 y.o. M) Primary Care Provider: [place], Mooney Other Clinician: Referring Provider: Treating Provider/Extender: Thressa Shiffer [place], Mooney Weeks in Treatment: 5 Cardiovascular Medical History: Positive for: Hypertension Past Medical History Notes: hypercholesteremia Endocrine Medical History: Past Medical History Notes:  hypothyroid Immunizations Pneumococcal Vaccine: Received Pneumococcal Vaccination: No Implantable Devices None Family and Social History Never smoker; Marital Status - Married; Alcohol Use: Moderate; Drug Use: No History; Caffeine Use: Daily; Financial Concerns: No; Food, Clothing or Shelter Needs: No; Support System Lacking: No; Transportation Concerns: No Electronic Signature(s) Signed: 02/17/2022 2:06:49 PM By: Geralyn Corwin DO Entered By: Geralyn Corwin on 02/17/2022 11:50:34 Brocksmith, [name] ([id number]) [id number] Page 6 of 6 -------------------------------------------------------------------------------- SuperBill Details Patient Name: Date of Service: [name], [name] 02/17/2022 Medical Record  Number: [id number] Patient Account Number: [id number] Date of Birth/Sex: Treating RN: 10-15-1960 (62 y.o. [name] Primary Care Provider: [place], Mooney Other Clinician: Referring Provider: Treating Provider/Extender: Krystale Rinkenberger [place], Mooney Weeks in Treatment: 5 Diagnosis Coding ICD-10 Codes Code Description [phone] Non-pressure chronic ulcer of other part of right lower leg with fat layer exposed T79.8XXA Other early complications of trauma, initial encounter N18.30 Chronic kidney disease, stage 3 unspecified I87.311 Chronic venous hypertension (idiopathic) with ulcer of right lower extremity Facility Procedures : CPT4 Code: [id number] Description: 99213 - WOUND CARE VISIT-LEV 3 EST PT Modifier: Quantity: 1 Physician Procedures : CPT4 Code Description Modifier [id number] 99213 - WC PHYS LEVEL 3 - EST PT ICD-10 Diagnosis Description L97.812 Non-pressure chronic ulcer of other part of right lower leg with fat layer exposed T79.8XXA Other early complications of trauma, initial  encounter N18.30 Chronic kidney disease, stage 3 unspecified I87.311 Chronic venous hypertension (idiopathic) with ulcer of right lower extremity Quantity: 1 Electronic Signature(s) Signed: 02/17/2022 2:06:49 PM By: Geralyn Corwin DO Entered By: Geralyn Corwin on 02/17/2022 11:53:22

## 2022-02-18 NOTE — Progress Notes (Signed)
SADAO, WEYER ([id number]) [id number] Page 1 of 9 Visit Report for 02/17/2022 Arrival Information Details Patient Name: Date of Service: [name], Keith B. 02/17/2022 9:00 A M Medical Record Number: [id number] Patient Account Number: [id number] Date of Birth/Sex: Treating RN: [birth year] (61 y.o. M) Primary Care Isley Weisheit: [place], [name] Other Clinician: Referring Darica Goren: Treating Rip Hawes/Extender: [name], Jessica [place], [name] Weeks in Treatment: 5 Visit Information History Since Last Visit Added or deleted any medications: No Patient Arrived: Ambulatory Any new allergies or adverse reactions: No Arrival Time: 09:09 Had a fall or experienced change in No Accompanied By: self activities of daily living that may affect Transfer Assistance: None risk of falls: Patient Identification Verified: Yes Signs or symptoms of abuse/neglect since last visito No Secondary Verification Process Completed: Yes Hospitalized since last visit: No Patient Requires Transmission-Based Precautions: No Implantable device outside of the clinic excluding No Patient Has Alerts: No cellular tissue based products placed in the center since last visit: Has Dressing in Place as Prescribed: Yes Pain Present Now: No Electronic Signature(s) Signed: 02/17/2022 3:56:34 PM By: Thayer Dallas Entered By: Thayer Dallas on 02/17/2022 09:10:33 -------------------------------------------------------------------------------- Clinic Level of Care Assessment Details Patient Name: Date of Service: [name], [name] 02/17/2022 9:00 A M Medical Record Number: [id number] Patient Account Number: [id number] Date of Birth/Sex: Treating RN: [birth year] (61 y.o. [name] Primary Care Kerissa Coia: [place], [name] Other Clinician: Referring Cederick Broadnax: Treating Micheil Klaus/Extender: [name], Jessica [place], [name] Weeks in Treatment: 5 Clinic Level of Care Assessment Items TOOL 4 Quantity  Score X- 1 0 Use when only an EandM is performed on FOLLOW-UP visit ASSESSMENTS - Nursing Assessment / Reassessment X- 1 10 Reassessment of Co-morbidities (includes updates in patient status) X- 1 5 Reassessment of Adherence to Treatment Plan ASSESSMENTS - Wound and Skin A ssessment / Reassessment X - Simple Wound Assessment / Reassessment - one wound 1 5 []  - 0 Complex Wound Assessment / Reassessment - multiple wounds X- 1 10 Dermatologic / Skin Assessment (not related to wound area) ASSESSMENTS - Focused Assessment X- 1 5 Circumferential Edema Measurements - multi extremities []  - 0 Nutritional Assessment / Counseling / Intervention ABHIRAM, CRIADO ( ) [id number] Page 2 of 9 []  - 0 Lower Extremity Assessment (monofilament, tuning fork, pulses) []  - 0 Peripheral Arterial Disease Assessment (using hand held doppler) ASSESSMENTS - Ostomy and/or Continence Assessment and Care []  - 0 Incontinence Assessment and Management []  - 0 Ostomy Care Assessment and Management (repouching, etc.) PROCESS - Coordination of Care X - Simple Patient / Family Education for ongoing care 1 15 []  - 0 Complex (extensive) Patient / Family Education for ongoing care X- 1 10 Staff obtains [id number], Records, T Results / Process Orders est []  - 0 Staff telephones HHA, Nursing Homes / Clarify orders / etc []  - 0 Routine Transfer to another Facility (non-emergent condition) []  - 0 Routine Hospital Admission (non-emergent condition) []  - 0 New Admissions / 09-29-2005 / Ordering NPWT Apligraf, etc. , []  - 0 Emergency Hospital Admission (emergent condition) X- 1 10 Simple Discharge Coordination []  - 0 Complex (extensive) Discharge Coordination PROCESS - Special Needs []  - 0 Pediatric / Minor Patient Management []  - 0 Isolation Patient Management []  - 0 Hearing / Language / Visual special needs []  - 0 Assessment of Community assistance  (transportation, D/C planning, etc.) []  - 0 Additional assistance / Altered mentation []  - 0 Support Surface(s) Assessment (bed, cushion, seat, etc.) INTERVENTIONS - Wound Cleansing / Measurement []  - 0 Simple Wound Cleansing -  one wound X- 2 5 Complex Wound Cleansing - multiple wounds X- 1 5 Wound Imaging (photographs - any number of wounds) []  - 0 Wound Tracing (instead of photographs) []  - 0 Simple Wound Measurement - one wound X- 2 5 Complex Wound Measurement - multiple wounds INTERVENTIONS - Wound Dressings X - Small Wound Dressing one or multiple wounds 1 10 []  - 0 Medium Wound Dressing one or multiple wounds []  - 0 Large Wound Dressing one or multiple wounds []  - 0 Application of Medications - topical []  - 0 Application of Medications - injection INTERVENTIONS - Miscellaneous []  - 0 External ear exam []  - 0 Specimen Collection (cultures, biopsies, blood, body fluids, etc.) []  - 0 Specimen(s) / Culture(s) sent or taken to Lab for analysis []  - 0 Patient Transfer (multiple staff / Nurse, adultHoyer Lift / Similar devices) []  - 0 Simple Staple / Suture removal (25 or less) []  - 0 Complex Staple / Suture removal (26 or more) []  - 0 Hypo / Hyperglycemic Management (close monitor of Blood Glucose) Schuld, Ryley B ([id number]) [id number] Page 3 of 9 []  - 0 Ankle / Brachial Index (ABI) - do not check if billed separately X- 1 5 Vital Signs Has the patient been seen at the hospital within the last three years: Yes Total Score: 110 Level Of Care: New/Established - Level 3 Electronic Signature(s) Signed: 02/17/2022 5:53:09 PM By: Shawn Stalleaton, Bobbi RN, BSN Entered By: Shawn Stalleaton, Bobbi on 02/17/2022 10:06:39 -------------------------------------------------------------------------------- Encounter Discharge Information Details Patient Name: Date of Service: [name], [place] LTER B. 02/17/2022 9:00 A M Medical Record Number: [id number] Patient Account Number:  [id number] Date of Birth/Sex: Treating RN: 1960/12/26 (61 y.o. Keith SoursM) Deaton, Bobbi Primary Care [name]: [place], [name] Other Clinician: Referring Tyshana Nishida: Treating Alexsys Eskin/Extender: [name], Jessica [place], [name] Weeks in Treatment: 5 Encounter Discharge Information Items Discharge Condition: Stable Ambulatory Status: Ambulatory Discharge Destination: Home Transportation: Private Auto Accompanied By: self Schedule Follow-up Appointment: No Clinical Summary of Care: Electronic Signature(s) Signed: 02/17/2022 5:53:09 PM By: Shawn Stalleaton, Bobbi RN, BSN Entered By: Shawn Stalleaton, Bobbi on 02/17/2022 10:07:02 -------------------------------------------------------------------------------- Lower Extremity Assessment Details Patient Name: Date of Service: [place], Keith LamasWA LTER B. 02/17/2022 9:00 A M Medical Record Number: [id number] Patient Account Number: [id number] Date of Birth/Sex: Treating RN: 1960/12/26 (61 y.o. M) Primary Care Skylier Kretschmer: [place], [name] Other Clinician: Referring Naveya Ellerman: Treating Eylin Pontarelli/Extender: [name], Jessica [place], [name] Weeks in Treatment: 5 Edema Assessment Assessed: [Left: No] [Right: No] Edema: [Left: N] [Right: o] Calf Left: Right: Point of Measurement: From Medial Instep 38 cm Ankle Left: Right: Point of Measurement: From Medial Instep 24 cm Vascular Assessment Joshi, Carter B ([id number]) [Right:[id number] Page 4 of 9] Pulses: Dorsalis Pedis Palpable: [Right:Yes] Electronic Signature(s) Signed: 02/17/2022 3:56:34 PM By: Thayer Dallasick, Kimberly Entered By: Thayer Dallasick, Kimberly on 02/17/2022 09:23:34 -------------------------------------------------------------------------------- Multi Wound Chart Details Patient Name: Date of Service: [name], [place] LTER B. 02/17/2022 9:00 A M Medical Record Number: [id number] Patient Account Number: [id number] Date of Birth/Sex: Treating RN: 1960/12/26 (61 y.o. M) Primary Care Chudney Scheffler: [place], [name] Other  Clinician: Referring Jenavie Stanczak: Treating Kharma Sampsel/Extender: [name], Jessica [place], [name] Weeks in Treatment: 5 Vital Signs Height(in): 72 Pulse(bpm): 64 Weight(lbs): 190 Blood Pressure(mmHg): 133/87 Body Mass Index(BMI): 25.8 Temperature(F): 97.8 Respiratory Rate(breaths/min): 18 [1:Photos:] [N/A:N/A] Right, Medial Lower Leg Right, Distal, Medial Lower Leg N/A Wound Location: Trauma Trauma N/A Wounding Event: Trauma, Other Trauma, Other N/A Primary Etiology: Hypertension Hypertension N/A Comorbid History: 12/13/2021 12/13/2021 N/A Date Acquired: 5 5 N/A Weeks of Treatment: Healed - Epithelialized Healed - Epithelialized N/A Wound Status: No No  N/A Wound Recurrence: 0x0x0 0x0x0 N/A Measurements L x W x D (cm) 0 0 N/A A (cm) : rea 0 0 N/A Volume (cm) : 100.00% 100.00% N/A % Reduction in A rea: 100.00% 100.00% N/A % Reduction in Volume: Full Thickness Without Exposed Full Thickness Without Exposed N/A Classification: Support Structures Support Structures Medium Medium N/A Exudate A mount: Serosanguineous Serosanguineous N/A Exudate Type: red, brown red, brown N/A Exudate Color: None Present (0%) None Present (0%) N/A Granulation A mount: Large (67-100%) Large (67-100%) N/A Necrotic A mount: N/A None N/A Epithelialization: Excoriation: No Excoriation: No N/A Periwound Skin Texture: Induration: No Induration: No Callus: No Callus: No Crepitus: No Crepitus: No Rash: No Rash: No Scarring: No Scarring: No Maceration: No Maceration: No N/A Periwound Skin Moisture: Dry/Scaly: No Dry/Scaly: No Ecchymosis: Yes Ecchymosis: Yes N/A Periwound Skin Color: Atrophie Blanche: No Atrophie Blanche: No Cyanosis: No Cyanosis: No Erythema: No Erythema: No Hemosiderin Staining: No Hemosiderin Staining: No Mottled: No Mottled: No ASTER, ECKRICH ([id number]) [id number] Page 5 of 9 Pallor: No Pallor: No Rubor: No Rubor:  No Treatment Notes Electronic Signature(s) Signed: 02/17/2022 2:06:49 PM By: Geralyn Corwin DO Entered By: Geralyn Corwin on 02/17/2022 11:49:27 -------------------------------------------------------------------------------- Multi-Disciplinary Care Plan Details Patient Name: Date of Service: [place], [name] LTER B. 02/17/2022 9:00 A M Medical Record Number: [id number] Patient Account Number: [id number] Date of Birth/Sex: Treating RN: 10/01/1960 (61 y.o. [name] Primary Care Ashlynn Gunnels: [place], [name] Other Clinician: Referring Floreine Kingdon: Treating Remo Kirschenmann/Extender: [name], Jessica [place], [name] Weeks in Treatment: 5 Active Inactive Electronic Signature(s) Signed: 02/17/2022 5:53:09 PM By: Shawn Stall RN, BSN Entered By: Shawn Stall on 02/17/2022 10:03:47 -------------------------------------------------------------------------------- Pain Assessment Details Patient Name: Date of Service: [place], [name] LTER B. 02/17/2022 9:00 A M Medical Record Number: [id number] Patient Account Number: [id number] Date of Birth/Sex: Treating RN: 1960-07-25 (61 y.o. M) Primary Care Tionne Carelli: [place], [name] Other Clinician: Referring Chrisma Hurlock: Treating Alyxandria Wentz/Extender: [name], Jessica [place], [name] Weeks in Treatment: 5 Active Problems Location of Pain Severity and Description of Pain Patient Has Paino No Site Locations [place], [place] B ([id number]) [id number] Page 6 of 9 Pain Management and Medication Current Pain Management: Electronic Signature(s) Signed: 02/17/2022 3:56:34 PM By: Thayer Dallas Entered By: Thayer Dallas on 02/17/2022 09:12:47 -------------------------------------------------------------------------------- Patient/Caregiver Education Details Patient Name: Date of Service: Twana First 11/14/2023andnbsp9:00 A M Medical Record Number: [id number] Patient Account Number: [id number] Date of Birth/Gender: Treating RN: 03-29-1961 (61  y.o. [name] Primary Care Physician: [place], [name] Other Clinician: Referring Physician: Treating Physician/Extender: [name], Jessica [place], [name] Weeks in Treatment: 5 Education Assessment Education Provided To: Patient Education Topics Provided Wound/Skin Impairment: Handouts: Skin Care Do's and Dont's Methods: Explain/Verbal Responses: Reinforcements needed Electronic Signature(s) Signed: 02/17/2022 5:53:09 PM By: Shawn Stall RN, BSN Entered By: Shawn Stall on 02/17/2022 09:43:19 -------------------------------------------------------------------------------- Wound Assessment Details Patient Name: Date of Service: [name] 02/17/2022 9:00 A M Medical Record Number: [id number] Patient Account Number: [id number] Date of Birth/Sex: Treating RN: March 19, 1961 (61 y.o. [name] Primary Care Bellatrix Devonshire: [place], [name] Other Clinician: Referring Jilberto Vanderwall: Treating Melrose Kearse/Extender: [name], Jessica [place], [name] Weeks in Treatment: 5 Wound Status Wound Number: 1 Primary Etiology: Trauma, Other Wound Location: Right, Medial Lower Leg Wound Status: Healed - Epithelialized Wounding Event: Trauma Comorbid History: Hypertension Date Acquired: 12/13/2021 Weeks Of Treatment: 5 Clustered Wound: No Photos JWAN, HORNBAKER ([id number]) [id number] Page 7 of 9 Wound Measurements Length: (cm) Width: (cm) Depth: (cm) Area: (cm) Volume: (cm) 0 % Reduction in Area: 100% 0 % Reduction in Volume:  100% 0 Tunneling: No 0 Undermining: No 0 Wound Description Classification: Full Thickness Without Exposed Sup Exudate Amount: Medium Exudate Type: Serosanguineous Exudate Color: red, brown port Structures Wound Bed Granulation Amount: None Present (0%) Necrotic Amount: Large (67-100%) Periwound Skin Texture Texture Color No Abnormalities Noted: No No Abnormalities Noted: No Callus: No Atrophie Blanche: No Crepitus:  No Cyanosis: No Excoriation: No Ecchymosis: Yes Induration: No Erythema: No Rash: No Hemosiderin Staining: No Scarring: No Mottled: No Pallor: No Moisture Rubor: No No Abnormalities Noted: No Dry / Scaly: No Maceration: No Electronic Signature(s) Signed: 02/17/2022 5:53:09 PM By: Shawn Stall RN, BSN Entered By: Shawn Stall on 02/17/2022 10:02:27 -------------------------------------------------------------------------------- Wound Assessment Details Patient Name: Date of Service: [name], [name] B. 02/17/2022 9:00 A M Medical Record Number: [id number] Patient Account Number: [id number] Date of Birth/Sex: Treating RN: 1960-09-23 (61 y.o. [name] Primary Care Jarica Plass: [place], [name] Other Clinician: Referring Kallen Mccrystal: Treating Melonee Gerstel/Extender: [name], Jessica [place], [name] Weeks in Treatment: 5 Wound Status Wound Number: 2 Primary Etiology: Trauma, Other Wound Location: Right, Distal, Medial Lower Leg Wound Status: Healed - Epithelialized Wounding Event: Trauma Comorbid History: Hypertension Date Acquired: 12/13/2021 Weeks Of Treatment: 5 Clustered Wound: Yes ONA, RATHERT ([id number]) [id number] Page 8 of 9 Photos Wound Measurements Length: (cm) Width: (cm) Depth: (cm) Area: (cm) Volume: (cm) 0 % Reduction in Area: 100% 0 % Reduction in Volume: 100% 0 Epithelialization: None 0 Tunneling: No 0 Undermining: No Wound Description Classification: Full Thickness Without Exposed Sup Exudate Amount: Medium Exudate Type: Serosanguineous Exudate Color: red, brown port Structures Wound Bed Granulation Amount: None Present (0%) Necrotic Amount: Large (67-100%) Periwound Skin Texture Texture Color No Abnormalities Noted: No No Abnormalities Noted: No Callus: No Atrophie Blanche: No Crepitus: No Cyanosis: No Excoriation: No Ecchymosis: Yes Induration: No Erythema: No Rash: No Hemosiderin Staining: No Scarring:  No Mottled: No Pallor: No Moisture Rubor: No No Abnormalities Noted: No Dry / Scaly: No Maceration: No Electronic Signature(s) Signed: 02/17/2022 5:53:09 PM By: Shawn Stall RN, BSN Entered By: Shawn Stall on 02/17/2022 10:02:27 -------------------------------------------------------------------------------- Vitals Details Patient Name: Date of Service: [name], [name] LTER B. 02/17/2022 9:00 A M Medical Record Number: [id number] Patient Account Number: [id number] Date of Birth/Sex: Treating RN: [birth year] (61 y.o. M) Primary Care Tinslee Klare: [place], [name] Other Clinician: Referring Kelsha Older: Treating Selena Swaminathan/Extender: [name], Jessica [place], [name] Weeks in Treatment: 5 Vital Signs Time Taken: 09:10 Temperature (F): 97.8 Height (in): 72 Pulse (bpm): 64 Weight (lbs): 190 Respiratory Rate (breaths/min): 18 Body Mass Index (BMI): 25.8 Blood Pressure (mmHg): 133/87 Reference Range: 80 - 120 mg / dl OBE, AHLERS B ([id number]) [id number].pdf Page 9 of 9 Electronic Signature(s) Signed: 02/17/2022 3:56:34 PM By: Thayer Dallas Entered By: Thayer Dallas on 02/17/2022 09:12:39

## 2024-01-12 ENCOUNTER — Other Ambulatory Visit: Payer: Self-pay | Admitting: Nurse Practitioner

## 2024-01-12 DIAGNOSIS — N200 Calculus of kidney: Secondary | ICD-10-CM

## 2024-01-14 ENCOUNTER — Ambulatory Visit
Admission: RE | Admit: 2024-01-14 | Discharge: 2024-01-14 | Disposition: A | Discharge status: Home / Self Care | Source: Ambulatory Visit | Attending: Nurse Practitioner | Admitting: Nurse Practitioner

## 2024-01-14 DIAGNOSIS — N200 Calculus of kidney: Secondary | ICD-10-CM

## 2024-01-18 ENCOUNTER — Other Ambulatory Visit: Payer: Self-pay

## 2024-01-18 ENCOUNTER — Ambulatory Visit
Admission: EM | Admit: 2024-01-18 | Discharge: 2024-01-18 | Disposition: A | Discharge status: Home / Self Care | Attending: Physician Assistant | Admitting: Physician Assistant

## 2024-01-18 DIAGNOSIS — H60391 Other infective otitis externa, right ear: Secondary | ICD-10-CM | POA: Diagnosis not present

## 2024-01-18 MED ORDER — CIPROFLOXACIN-DEXAMETHASONE 0.3-0.1 % OT SUSP: 4.0000 [drp] | Freq: Two times a day (BID) | OTIC | 0 refills | Status: AC

## 2024-01-18 NOTE — ED Provider Notes (Signed)
 [facility]    CSN: [id number] Arrival date & time: 01/18/24  1716      History   Chief Complaint Chief Complaint  Patient presents with   Ear Fullness    HPI Kaedin Hicklin Minnie is a 63 y.o. male.  has a past medical history of ADHD, Arthritis, Chronic kidney disease (CKD), stage III (moderate) (HCC), GERD (gastroesophageal reflux disease), Heart murmur, History of 2019 novel coronavirus disease (COVID-19) (05/03/2020), History of kidney stones, Hypertension, Hypothyroidism, Left ureteral calculus, Migraine headache without aura, Renal cyst, Urgency of urination, Wears contact lenses, and Wears hearing aid in both ears.   HPI  Discussed the use of AI scribe software for clinical note transcription with the patient, who gave verbal consent to proceed.  The patient presents with a painful swelling in the right ear canal.  The patient has a painful swelling in the right ear canal, described as a 'blind pimple' deep down. This is the second occurrence in the last couple of years, with the current episode starting last Wednesday. The swelling has progressed, preventing the use of his hearing aid in the right ear due to tightness and reduced hearing ability.  In the past, he received antibiotics for a similar issue but is uncertain of the exact treatment. Symptoms worsened last night, causing him to wake up from sleep, and he took Tylenol , which helped reduce the pain.  No drainage or bleeding from the ear is reported, but there is tenderness and swelling around the ear, particularly below it. His wife observed flaky skin in the area. No fever or chills.   Past Medical History:  Diagnosis Date   ADHD    Arthritis    Chronic kidney disease (CKD), stage III (moderate) (HCC)    followed by pcp   GERD (gastroesophageal reflux disease)    per pt adjusted diet   Heart murmur    per pt was told by pcp approx. 2020 that a slight murmur heard, did not order any work-up    History of 2019 novel coronavirus disease (COVID-19) 05/03/2020   positive resolve in epic,  per pt had fever, throat ulcers, cough.  fever resolved in 2 days, throat ulcers resolved in 1 week,  and cough resolved in 3 wks   History of kidney stones    Hypertension    Hypothyroidism    endocrinologist--- dr    Left ureteral calculus    Migraine headache without aura    Renal cyst    Urgency of urination    Wears contact lenses    Wears hearing aid in both ears     Patient Active Problem List   Diagnosis Date Noted   Allergic rhinitis 03/09/2018   Rectal bleed 08/24/2017   Renal cyst 06/18/2017   Back pain 06/18/2017   Nodule of middle lobe of right lung 06/17/2017   Nephrolithiasis 06/17/2017   Hypothyroidism (acquired) 12/24/2016   CKD (chronic kidney disease), stage III (HCC) 01/03/2016   Hyperlipidemia 01/03/2016   Vitamin D  deficiency 01/03/2016   Essential hypertension 09/03/2015   Headache, migraine 09/03/2015   Attention deficit hyperactivity disorder (ADHD) 09/03/2015    Past Surgical History:  Procedure Laterality Date   COLONOSCOPY  last one 02/ 2019   CYSTOSCOPY W/ URETERAL STENT PLACEMENT Left 04/11/2020   Procedure: CYSTOSCOPY WITH RETROGRADE PYELOGRAM/URETERAL STENT PLACEMENT;  Surgeon: Selma Donnice SAUNDERS, MD;  Location: [facility];  Service: Urology;  Laterality: Left;   CYSTOSCOPY/URETEROSCOPY/HOLMIUM LASER/STENT PLACEMENT Left 05/27/2020   Procedure: CYSTOSCOPY/RETROGRADE/URETEROSCOPY/HOLMIUM LASER/STENT  PLACEMENT;  Surgeon: Selma Donnice SAUNDERS, MD;  Location: [facility];  Service: Urology;  Laterality: Left;  ONLY NEEDS 60 MIN   INGUINAL HERNIA REPAIR Bilateral left 02-04-2010 @ [facility]/  right   KNEE ARTHROSCOPY Left 2002 approx.   SHOULDER ARTHROSCOPY Left 03/22/2020   Procedure: LEFT SHOULDER ARTHROSCOPY,ACROMIOPLASTY, DEBRIDEMENT;  Surgeon: Sheril Coy, MD;  Location: [facility];  Service: Orthopedics;  Laterality: Left;   TONSILLECTOMY  2008 approx.    WISDOM TOOTH EXTRACTION         Home Medications    Prior to Admission medications   Medication Sig Start Date End Date Taking? Authorizing Provider  ciprofloxacin -dexamethasone  (CIPRODEX) OTIC suspension Place 4 drops into the right ear 2 (two) times daily for 10 days. 01/18/24 01/28/24 Yes Merle Cirelli E, PA-C  Ascorbic Acid (VITAMIN C) 1000 MG tablet Take 1,000 mg by mouth daily.     [provider]  aspirin 81 MG EC tablet Take 81 mg by mouth daily.     [provider]  bacitracin  ointment Apply 1 Application topically 2 (two) times daily. 12/11/21   Blaise Aleene KIDD, MD  Cholecalciferol (VITAMIN D ) 50 MCG (2000 UT) CAPS Take 2,000 Units by mouth daily.    [provider]  Evolocumab (REPATHA SURECLICK) 140 MG/ML SOAJ INJECT 140MG  SUBCUTANEOUS EVERY 2 WEEKS 30 DAYS    [provider]  FLONASE  50 MCG/ACT nasal spray Place 2 sprays into both nostrils daily. Patient taking differently: Place 2 sprays into both nostrils daily as needed for allergies or rhinitis. 03/09/18   [place], [name] G, MD  hydrochlorothiazide  (HYDRODIURIL ) 25 MG tablet TAKE 1 TABLET BY MOUTH EVERY DAY 06/04/21   [place], [name] G, MD  irbesartan  (AVAPRO ) 75 MG tablet TAKE 1 TABLET BY MOUTH EVERY DAY Patient taking differently: Take 75 mg by mouth daily. 12/05/19   [place], [name] G, MD  irbesartan  (AVAPRO ) 75 MG tablet Take 1 tablet by mouth daily.    [provider]  levothyroxine  (SYNTHROID ) 100 MCG tablet TAKE 1 TABLET BY MOUTH EVERY DAY 09/23/20   [place], [name] G, MD  levothyroxine  (SYNTHROID ) 100 MCG tablet Take 1 tablet by mouth daily.    [provider]  methocarbamol  (ROBAXIN ) 500 MG tablet Take 1 tablet (500 mg total) by mouth at bedtime as needed for muscle spasms. 12/11/21   Blaise Aleene KIDD, MD  Multiple Vitamin (MULTI-VITAMINS) TABS Take 1 tablet by mouth daily.     [provider]  multivitamin-lutein (OCUVITE-LUTEIN) CAPS capsule Take 1 capsule by mouth  daily.    [provider]  mupirocin  ointment (BACTROBAN ) 2 % Apply 1 Application topically 2 (two) times daily. To affected area till better 12/22/21   Vonna Sharlet POUR, MD  NEXLETOL 180 MG TABS Take 180 mg by mouth daily.  Patient not taking: Reported on 12/11/2021 07/18/19   [provider]  Omega-3 1000 MG CAPS Take 1,000 mg by mouth daily.     [provider]  ondansetron  (ZOFRAN  ODT) 4 MG disintegrating tablet 4mg  ODT q4 hours prn nausea/vomit Patient taking differently: Take 4 mg by mouth every 8 (eight) hours as needed for nausea or vomiting. 03/12/20   Emil Share, DO  REPATHA SURECLICK 140 MG/ML SOAJ SMARTSIG:140 Milligram(s) SUB-Q Every 2 Weeks 11/10/21   [provider]  traMADol  (ULTRAM ) 50 MG tablet Take 1 tablet (50 mg total) by mouth every 6 (six) hours as needed. 12/11/21   Blaise Aleene KIDD, MD  TURMERIC PO Take 1,000 mg by  mouth daily.    [provider]  Ubiquinol 100 MG CAPS Take 100 mg by mouth daily.    [provider]    Family History Family History  Problem Relation Age of Onset   Mental illness Mother    Cancer Paternal Aunt        lung   Cancer Paternal Grandmother        lung   Diabetes Neg Hx     Social History Social History   Tobacco Use   Smoking status: Never   Smokeless tobacco: Never  Vaping Use   Vaping status: Never Used  Substance Use Topics   Alcohol use: Yes    Alcohol/week: 0.0 standard drinks of alcohol    Comment: occasionally   Drug use: Never     Allergies   Benazepril hcl, Other, Sulfa antibiotics, Hydrocodone , and Penicillins   Review of Systems Review of Systems  Constitutional:  Negative for chills and fever.  HENT:  Positive for ear pain. Negative for ear discharge.      Physical Exam Triage Vital Signs ED Triage Vitals  Encounter Vitals Group     BP 01/18/24 1902 109/83     Girls Systolic BP Percentile --      Girls Diastolic BP Percentile --      Boys Systolic  BP Percentile --      Boys Diastolic BP Percentile --      Pulse Rate 01/18/24 1902 76     Resp 01/18/24 1902 18     Temp 01/18/24 1902 98.1 F (36.7 C)     Temp Source 01/18/24 1902 Oral     SpO2 01/18/24 1902 95 %     Weight 01/18/24 1902 185 lb (83.9 kg)     Height 01/18/24 1902 5' 10 (1.778 m)     Head Circumference --      Peak Flow --      Pain Score 01/18/24 1923 8     Pain Loc --      Pain Education --      Exclude from Growth Chart --    No data found.  Updated Vital Signs BP 109/83 (BP Location: Right Arm)   Pulse 76   Temp 98.1 F (36.7 C) (Oral)   Resp 18   Ht 5' 10 (1.778 m)   Wt 185 lb (83.9 kg)   SpO2 95%   BMI 26.54 kg/m   Visual Acuity Right Eye Distance:   Left Eye Distance:   Bilateral Distance:    Right Eye Near:   Left Eye Near:    Bilateral Near:     Physical Exam Vitals reviewed.  Constitutional:      General: He is awake.     Appearance: Normal appearance. He is well-developed and well-groomed.  HENT:     Head: Normocephalic and atraumatic.     Right Ear: Hearing normal. Drainage, swelling and tenderness present.     Ears:     Comments: Visualized portion of the right tympanic membrane appears intact without perforation, erythema, retraction or bulging.  There does appear to be some scarring or even a cholesteatoma.  Visualization is partially obscured due to swelling of the canal.  Ear canal is notably erythematous with maceration Eyes:     Extraocular Movements: Extraocular movements intact.     Conjunctiva/sclera: Conjunctivae normal.  Pulmonary:     Effort: Pulmonary effort is normal.  Musculoskeletal:     Cervical back: Normal range of motion.  Neurological:  Mental Status: He is alert and oriented to person, place, and time.  Psychiatric:        Attention and Perception: Attention normal.        Mood and Affect: Mood normal.        Speech: Speech normal.        Behavior: Behavior normal. Behavior is cooperative.       UC Treatments / Results  Labs (all labs ordered are listed, but only abnormal results are displayed) Labs Reviewed - No data to display  EKG   Radiology No results found.  Procedures Procedures (including critical care time)  Medications Ordered in UC Medications - No data to display  Initial Impression / Assessment and Plan / UC Course  I have reviewed the triage vital signs and the nursing notes.  Pertinent labs & imaging results that were available during my care of the patient were reviewed by me and considered in my medical decision making (see chart for details).      Final Clinical Impressions(s) / UC Diagnoses   Final diagnoses:  Infective otitis externa of right ear   Otitis externa, right ear Acute otitis externa in the right ear, likely due to trauma from hearing aid use. Symptoms include swelling, tenderness, and significant hearing impairment. No drainage, bleeding, fever, or chills. Examination reveals swelling and tenderness in the ear canal, with no bulging or infection of the eardrum Will send in Ciprodex otic solution to assist with management.  Recommend alternating Tylenol  and ibuprofen as needed for pain control.  ED and return precautions reviewed and provided in AVS.  Follow-up as needed for progressing or persistent symptoms   Discharge Instructions      You were seen today for concerns of right ear pain.  Your physical exam is consistent with an infection in your ear canal also known as otitis externa.  I have sent to medication called Ciprodex for you to instill in the ear twice per day for 7 days Please try to avoid getting the ear canal read or submerging your head in water as this can cause further infection and contamination in the area If you feel like your symptoms are not improving or seem to be worsening you can return here or follow-up with your PCP for further evaluation If you develop any of the following please return to  urgent care or the emergency room as these could be signs of worsening infection: Severe pain of the ear, pain in the surrounding areas such as behind the ear or the jaw, fevers, displacement of the ear due to swelling, severe headaches, copious amounts of drainage and blood from the ear      ED Prescriptions     Medication Sig Dispense Auth. Provider   ciprofloxacin -dexamethasone  (CIPRODEX) OTIC suspension Place 4 drops into the right ear 2 (two) times daily for 10 days. 4 mL Menachem Urbanek E, PA-C      PDMP not reviewed this encounter.   Marylene Rocky BRAVO, PA-C 01/18/24 1941

## 2024-01-18 NOTE — ED Triage Notes (Signed)
 Pt presents with a chief complaint of right ear fullness x 6 days. Currently rates overall ear pain an 8/10. Hearing is decreased in the ear. Does wear a hearing aid daily. Unable to fit in right ear due to the swelling/tightness of the canal. Pt shares that he squirted Flonase  into the ear + took Tylenol  with temporary relief/improvement.

## 2024-01-18 NOTE — Discharge Instructions (Addendum)
 You were seen today for concerns of right ear pain.  Your physical exam is consistent with an infection in your ear canal also known as otitis externa. I have sent to medication called Ciprodex  for you to instill in the ear twice per day for 7 days Please try to avoid getting the ear canal read or submerging your head in water as this can cause further infection and contamination in the area If you feel like your symptoms are not improving or seem to be worsening you can return here or follow-up with your PCP for further evaluation If you develop any of the following please return to urgent care or the emergency room as these could be signs of worsening infection: Severe pain of the ear, pain in the surrounding areas such as behind the ear or the jaw, fevers, displacement of the ear due to swelling, severe headaches, copious amounts of drainage and blood from the ear

## 2024-02-07 ENCOUNTER — Other Ambulatory Visit: Payer: Self-pay | Admitting: Orthopaedic Surgery

## 2024-02-07 DIAGNOSIS — M5459 Other low back pain: Secondary | ICD-10-CM

## 2024-02-24 ENCOUNTER — Other Ambulatory Visit

## 2024-02-25 ENCOUNTER — Ambulatory Visit
Admission: RE | Admit: 2024-02-25 | Discharge: 2024-02-25 | Disposition: A | Discharge status: Home / Self Care | Source: Ambulatory Visit | Attending: Orthopaedic Surgery | Admitting: Orthopaedic Surgery

## 2024-02-25 DIAGNOSIS — M5459 Other low back pain: Secondary | ICD-10-CM
